# Patient Record
Sex: Female | Born: 1955
Health system: Southern US, Community
[De-identification: ages and names within clinical notes are randomized; demographics above are authoritative.]

## PROBLEM LIST (undated history)

## (undated) DIAGNOSIS — K52831 Collagenous colitis: Secondary | ICD-10-CM

## (undated) DIAGNOSIS — J189 Pneumonia, unspecified organism: Secondary | ICD-10-CM

## (undated) DIAGNOSIS — G629 Polyneuropathy, unspecified: Secondary | ICD-10-CM

## (undated) DIAGNOSIS — F319 Bipolar disorder, unspecified: Secondary | ICD-10-CM

## (undated) DIAGNOSIS — F1010 Alcohol abuse, uncomplicated: Secondary | ICD-10-CM

## (undated) DIAGNOSIS — M1990 Unspecified osteoarthritis, unspecified site: Secondary | ICD-10-CM

## (undated) DIAGNOSIS — F1910 Other psychoactive substance abuse, uncomplicated: Secondary | ICD-10-CM

## (undated) HISTORY — PX: WRIST SURGERY: SHX841

## (undated) HISTORY — PX: ABDOMINAL HYSTERECTOMY: SHX81

## (undated) HISTORY — PX: FRACTURE SURGERY: SHX138

## (undated) HISTORY — PX: TONSILLECTOMY: SUR1361

## (undated) HISTORY — PX: NOSE SURGERY: SHX723

## (undated) HISTORY — PX: FOOT SURGERY: SHX648

## (undated) HISTORY — DX: Polyneuropathy, unspecified: G62.9

---

## 2010-06-10 ENCOUNTER — Inpatient Hospital Stay (HOSPITAL_COMMUNITY): Admission: EM | Admit: 2010-06-10 | Discharge: 2010-06-14 | Payer: Self-pay | Admitting: Emergency Medicine

## 2010-06-12 ENCOUNTER — Encounter (INDEPENDENT_AMBULATORY_CARE_PROVIDER_SITE_OTHER): Payer: Self-pay | Admitting: Internal Medicine

## 2010-06-14 ENCOUNTER — Ambulatory Visit: Payer: Self-pay | Admitting: Internal Medicine

## 2010-08-12 ENCOUNTER — Ambulatory Visit: Payer: Self-pay | Admitting: Internal Medicine

## 2010-08-12 ENCOUNTER — Encounter (INDEPENDENT_AMBULATORY_CARE_PROVIDER_SITE_OTHER): Payer: Self-pay | Admitting: Family Medicine

## 2010-08-12 LAB — CONVERTED CEMR LAB
ALT: 9 units/L (ref 0–35)
Albumin: 4.2 g/dL (ref 3.5–5.2)
Alkaline Phosphatase: 94 units/L (ref 39–117)
BUN: 9 mg/dL (ref 6–23)
Basophils Absolute: 0 10*3/uL (ref 0.0–0.1)
Basophils Relative: 0 % (ref 0–1)
CO2: 27 meq/L (ref 19–32)
Calcium: 9.5 mg/dL (ref 8.4–10.5)
Ferritin: 143 ng/mL (ref 10–291)
Folate: >20 ng/mL
Glucose, Bld: 91 mg/dL (ref 70–99)
Iron: 109 ug/dL (ref 42–145)
MCV: 102.9 fL — ABNORMAL HIGH (ref 78.0–100.0)
Monocytes Absolute: 0.6 10*3/uL (ref 0.1–1.0)
Platelets: 278 10*3/uL (ref 150–400)
Potassium: 5.4 meq/L — ABNORMAL HIGH (ref 3.5–5.3)
RDW: 14.1 % (ref 11.5–15.5)
Rhuematoid fact SerPl-aCnc: <20 intl units/mL (ref 0–20)
Saturation Ratios: 30 % (ref 20–55)
Sed Rate: 36 mm/hr — ABNORMAL HIGH (ref 0–22)
Sodium: 142 meq/L (ref 135–145)
Total Bilirubin: 0.3 mg/dL (ref 0.3–1.2)
Total CHOL/HDL Ratio: 5.2
Triglycerides: 402 mg/dL — ABNORMAL HIGH (ref <150)
UIBC: 257 ug/dL
WBC: 8.2 10*3/uL (ref 4.0–10.5)

## 2010-08-28 ENCOUNTER — Ambulatory Visit (HOSPITAL_COMMUNITY): Admission: RE | Admit: 2010-08-28 | Discharge: 2010-08-28 | Payer: Self-pay | Admitting: Internal Medicine

## 2011-02-28 LAB — URINALYSIS, ROUTINE W REFLEX MICROSCOPIC
Ketones, ur: NEGATIVE mg/dL
Protein, ur: NEGATIVE mg/dL
pH: 7 (ref 5.0–8.0)

## 2011-02-28 LAB — UIFE/LIGHT CHAINS/TP QN, 24-HR UR
Alpha 1, Urine: DETECTED — AB
Alpha 2, Urine: DETECTED — AB
Free Kappa Lt Chains,Ur: 9.29 mg/dL — ABNORMAL HIGH (ref 0.04–1.51)
Gamma Globulin, Urine: DETECTED — AB

## 2011-02-28 LAB — CBC
HCT: 20.3 % — ABNORMAL LOW (ref 36.0–46.0)
HCT: 25.4 % — ABNORMAL LOW (ref 36.0–46.0)
HCT: 23 % — ABNORMAL LOW (ref 36.0–46.0)
Hemoglobin: 6.7 g/dL — CL (ref 12.0–15.0)
Hemoglobin: 8.7 g/dL — ABNORMAL LOW (ref 12.0–15.0)
Hemoglobin: 7.3 g/dL — ABNORMAL LOW (ref 12.0–15.0)
Hemoglobin: 7.6 g/dL — ABNORMAL LOW (ref 12.0–15.0)
MCH: 33.2 pg (ref 26.0–34.0)
MCH: 33.5 pg (ref 26.0–34.0)
MCH: 33 pg (ref 26.0–34.0)
MCHC: 33.8 g/dL (ref 30.0–36.0)
MCHC: 34.2 g/dL (ref 30.0–36.0)
MCV: 97.6 fL (ref 78.0–100.0)
MCV: 99.3 fL (ref 78.0–100.0)
MCV: 99.9 fL (ref 78.0–100.0)
Platelets: 218 10*3/uL (ref 150–400)
RBC: 2.73 MIL/uL — ABNORMAL LOW (ref 3.87–5.11)
RBC: 2.3 MIL/uL — ABNORMAL LOW (ref 3.87–5.11)
RDW: 19.3 % — ABNORMAL HIGH (ref 11.5–15.5)
RDW: 19.9 % — ABNORMAL HIGH (ref 11.5–15.5)
RDW: 19.9 % — ABNORMAL HIGH (ref 11.5–15.5)
WBC: 5.2 10*3/uL (ref 4.0–10.5)
WBC: 6.4 10*3/uL (ref 4.0–10.5)
WBC: 6.6 10*3/uL (ref 4.0–10.5)

## 2011-02-28 LAB — COMPREHENSIVE METABOLIC PANEL
ALT: 20 U/L (ref 0–35)
AST: 28 U/L (ref 0–37)
AST: 40 U/L — ABNORMAL HIGH (ref 0–37)
AST: 45 U/L — ABNORMAL HIGH (ref 0–37)
Alkaline Phosphatase: 147 U/L — ABNORMAL HIGH (ref 39–117)
BUN: 9 mg/dL (ref 6–23)
BUN: 9 mg/dL (ref 6–23)
CO2: 25 mEq/L (ref 19–32)
CO2: 27 mEq/L (ref 19–32)
CO2: 28 mEq/L (ref 19–32)
Calcium: 8.1 mg/dL — ABNORMAL LOW (ref 8.4–10.5)
Chloride: 108 mEq/L (ref 96–112)
Chloride: 103 mEq/L (ref 96–112)
Chloride: 99 mEq/L (ref 96–112)
Creatinine, Ser: 0.55 mg/dL (ref 0.4–1.2)
GFR calc non Af Amer: >60 mL/min (ref >60)
GFR calc non Af Amer: >60 mL/min (ref >60)
Glucose, Bld: 77 mg/dL (ref 70–99)
Glucose, Bld: 96 mg/dL (ref 70–99)
Potassium: 3.7 mEq/L (ref 3.5–5.1)
Total Bilirubin: 0.5 mg/dL (ref 0.3–1.2)
Total Bilirubin: 0.5 mg/dL (ref 0.3–1.2)
Total Protein: 5 g/dL — ABNORMAL LOW (ref 6.0–8.3)
Total Protein: 5.5 g/dL — ABNORMAL LOW (ref 6.0–8.3)

## 2011-02-28 LAB — DIFFERENTIAL
Eosinophils Absolute: 0.1 10*3/uL (ref 0.0–0.7)
Lymphocytes Relative: 43 % (ref 12–46)
Neutro Abs: 3.2 10*3/uL (ref 1.7–7.7)

## 2011-02-28 LAB — PROTEIN ELECTROPHORESIS, SERUM
Alpha-2-Globulin: 13.4 % — ABNORMAL HIGH (ref 7.1–11.8)
Beta Globulin: 7 % (ref 4.7–7.2)
Gamma Globulin: 16.6 % (ref 11.1–18.8)
M-Spike, %: NOT DETECTED g/dL

## 2011-02-28 LAB — FERRITIN: Ferritin: 211 ng/mL (ref 10–291)

## 2011-02-28 LAB — TYPE AND SCREEN

## 2011-02-28 LAB — PREPARE RBC (CROSSMATCH)

## 2011-02-28 LAB — URINE CULTURE: Colony Count: 2000

## 2011-02-28 LAB — HEPATITIS PANEL, ACUTE
Hep B C IgM: NEGATIVE
Hepatitis B Surface Ag: NEGATIVE

## 2011-02-28 LAB — POCT I-STAT, CHEM 8
BUN: 10 mg/dL (ref 6–23)
Chloride: 102 mEq/L (ref 96–112)
Creatinine, Ser: 0.6 mg/dL (ref 0.4–1.2)
Hemoglobin: 7.5 g/dL — ABNORMAL LOW (ref 12.0–15.0)
TCO2: 27 mmol/L (ref 0–100)

## 2011-02-28 LAB — URINE MICROSCOPIC-ADD ON

## 2011-02-28 LAB — LACTATE DEHYDROGENASE: LDH: 145 U/L (ref 94–250)

## 2011-02-28 LAB — T3, FREE: T3, Free: 2.1 pg/mL — ABNORMAL LOW (ref 2.3–4.2)

## 2011-02-28 LAB — IRON AND TIBC: TIBC: 267 ug/dL (ref 250–470)

## 2011-02-28 LAB — VITAMIN B12: Vitamin B-12: 428 pg/mL (ref 211–911)

## 2011-04-12 ENCOUNTER — Emergency Department (HOSPITAL_COMMUNITY)
Admission: EM | Admit: 2011-04-12 | Discharge: 2011-04-12 | Disposition: A | Discharge status: Home / Self Care | Payer: Self-pay | Attending: Emergency Medicine | Admitting: Emergency Medicine

## 2011-04-12 DIAGNOSIS — B029 Zoster without complications: Secondary | ICD-10-CM | POA: Insufficient documentation

## 2011-04-21 ENCOUNTER — Emergency Department (HOSPITAL_COMMUNITY)
Admission: EM | Admit: 2011-04-21 | Discharge: 2011-04-21 | Disposition: A | Discharge status: Home / Self Care | Payer: Self-pay | Attending: Emergency Medicine | Admitting: Emergency Medicine

## 2011-04-21 DIAGNOSIS — E871 Hypo-osmolality and hyponatremia: Secondary | ICD-10-CM | POA: Insufficient documentation

## 2011-04-21 DIAGNOSIS — R197 Diarrhea, unspecified: Secondary | ICD-10-CM | POA: Insufficient documentation

## 2011-04-21 DIAGNOSIS — K589 Irritable bowel syndrome without diarrhea: Secondary | ICD-10-CM | POA: Insufficient documentation

## 2011-04-21 LAB — POCT I-STAT, CHEM 8
BUN: 3 mg/dL — ABNORMAL LOW (ref 6–23)
Calcium, Ion: 1.04 mmol/L — ABNORMAL LOW (ref 1.12–1.32)
Chloride: 94 meq/L — ABNORMAL LOW (ref 96–112)
Creatinine, Ser: 0.8 mg/dL (ref 0.4–1.2)
Glucose, Bld: 81 mg/dL (ref 70–99)
HCT: 44 % (ref 36.0–46.0)
Hemoglobin: 15 g/dL (ref 12.0–15.0)
Potassium: 4.4 meq/L (ref 3.5–5.1)
Sodium: 128 mEq/L — ABNORMAL LOW (ref 135–145)
TCO2: 25 mmol/L (ref 0–100)

## 2011-08-28 ENCOUNTER — Emergency Department (HOSPITAL_COMMUNITY): Payer: Medicaid Other

## 2011-08-28 ENCOUNTER — Emergency Department (HOSPITAL_COMMUNITY)
Admission: EM | Admit: 2011-08-28 | Discharge: 2011-08-28 | Disposition: A | Discharge status: Home / Self Care | Payer: Medicaid Other | Attending: Emergency Medicine | Admitting: Emergency Medicine

## 2011-08-28 DIAGNOSIS — R05 Cough: Secondary | ICD-10-CM | POA: Insufficient documentation

## 2011-08-28 DIAGNOSIS — M7989 Other specified soft tissue disorders: Secondary | ICD-10-CM | POA: Insufficient documentation

## 2011-08-28 DIAGNOSIS — R0609 Other forms of dyspnea: Secondary | ICD-10-CM | POA: Insufficient documentation

## 2011-08-28 DIAGNOSIS — R059 Cough, unspecified: Secondary | ICD-10-CM | POA: Insufficient documentation

## 2011-08-28 DIAGNOSIS — X500XXA Overexertion from strenuous movement or load, initial encounter: Secondary | ICD-10-CM | POA: Insufficient documentation

## 2011-08-28 DIAGNOSIS — K029 Dental caries, unspecified: Secondary | ICD-10-CM | POA: Insufficient documentation

## 2011-08-28 DIAGNOSIS — J40 Bronchitis, not specified as acute or chronic: Secondary | ICD-10-CM | POA: Insufficient documentation

## 2011-08-28 DIAGNOSIS — R0989 Other specified symptoms and signs involving the circulatory and respiratory systems: Secondary | ICD-10-CM | POA: Insufficient documentation

## 2011-08-28 DIAGNOSIS — S82899A Other fracture of unspecified lower leg, initial encounter for closed fracture: Secondary | ICD-10-CM | POA: Insufficient documentation

## 2011-10-01 ENCOUNTER — Other Ambulatory Visit (HOSPITAL_COMMUNITY): Payer: Self-pay | Admitting: Family Medicine

## 2011-10-01 DIAGNOSIS — Z1231 Encounter for screening mammogram for malignant neoplasm of breast: Secondary | ICD-10-CM

## 2011-10-22 ENCOUNTER — Ambulatory Visit (HOSPITAL_COMMUNITY)
Admission: RE | Admit: 2011-10-22 | Discharge: 2011-10-22 | Disposition: A | Discharge status: Home / Self Care | Payer: Medicaid Other | Source: Ambulatory Visit | Attending: Family Medicine | Admitting: Family Medicine

## 2011-10-22 DIAGNOSIS — Z1231 Encounter for screening mammogram for malignant neoplasm of breast: Secondary | ICD-10-CM

## 2011-10-22 DIAGNOSIS — Z13820 Encounter for screening for osteoporosis: Secondary | ICD-10-CM | POA: Insufficient documentation

## 2011-10-22 DIAGNOSIS — Z780 Asymptomatic menopausal state: Secondary | ICD-10-CM | POA: Insufficient documentation

## 2011-11-09 ENCOUNTER — Ambulatory Visit (HOSPITAL_COMMUNITY)
Admission: RE | Admit: 2011-11-09 | Discharge: 2011-11-09 | Disposition: A | Discharge status: Home / Self Care | Payer: Medicaid Other | Source: Ambulatory Visit | Attending: Family Medicine | Admitting: Family Medicine

## 2011-11-09 ENCOUNTER — Other Ambulatory Visit (HOSPITAL_COMMUNITY): Payer: Self-pay | Admitting: Family Medicine

## 2011-11-09 DIAGNOSIS — R52 Pain, unspecified: Secondary | ICD-10-CM

## 2011-11-09 DIAGNOSIS — M25569 Pain in unspecified knee: Secondary | ICD-10-CM | POA: Insufficient documentation

## 2012-01-13 ENCOUNTER — Emergency Department (HOSPITAL_COMMUNITY)
Admission: EM | Admit: 2012-01-13 | Discharge: 2012-01-13 | Disposition: A | Discharge status: Home / Self Care | Payer: Medicaid Other | Source: Home / Self Care | Attending: Emergency Medicine | Admitting: Emergency Medicine

## 2012-01-13 ENCOUNTER — Encounter (HOSPITAL_COMMUNITY): Payer: Self-pay | Admitting: Emergency Medicine

## 2012-01-13 DIAGNOSIS — M25519 Pain in unspecified shoulder: Secondary | ICD-10-CM

## 2012-01-13 HISTORY — DX: Other psychoactive substance abuse, uncomplicated: F19.10

## 2012-01-13 HISTORY — DX: Alcohol abuse, uncomplicated: F10.10

## 2012-01-13 HISTORY — DX: Unspecified osteoarthritis, unspecified site: M19.90

## 2012-01-13 MED ORDER — INDOMETHACIN 25 MG PO CAPS: 25.0000 mg | ORAL_CAPSULE | Freq: Three times a day (TID) | ORAL | Status: AC

## 2012-01-13 NOTE — ED Notes (Signed)
C/o shoulder pain onset last Thursday.  Patient reported left neck and shoulder pain thinking she slept on neck wrong.  Has used heat and ice therapy, taken ibuprofen and lidocaine, voltaren cream and no relief

## 2012-01-13 NOTE — ED Provider Notes (Signed)
History     CSN: [id number]  Arrival date & time 01/13/12  1433   First MD Initiated Contact with Patient 01/13/12 1535      Chief Complaint  Patient presents with  . Shoulder Pain    (Consider location/radiation/quality/duration/timing/severity/associated sxs/prior treatment) HPI Comments: L shoulder pain,   Patient is a 56 y.o. female presenting with shoulder pain. The history is provided by the patient.  Shoulder Pain This is a new problem. The current episode started more than 2 days ago. The problem occurs constantly. The problem has not changed since onset.Pertinent negatives include no chest pain, no abdominal pain, no headaches and no shortness of breath. Exacerbated by: raising left shoudler and moving arm. The symptoms are relieved by nothing. She has tried nothing for the symptoms. The treatment provided no relief.    Past Medical History  Diagnosis Date  . Alcohol abuse last February 2012  . Drug abuse last 1990's  . Arthritis     Past Surgical History  Procedure Date  . Nose surgery   . Abdominal hysterectomy   . Tonsillectomy   . Wrist surgery     No family history on file.  History  Substance Use Topics  . Smoking status: Current Everyday Smoker    Types: Cigars  . Smokeless tobacco: Not on file  . Alcohol Use: No    OB History    Grav Para Term Preterm Abortions TAB SAB Ect Mult Living                  Review of Systems  Respiratory: Negative for shortness of breath.   Cardiovascular: Negative for chest pain.  Gastrointestinal: Negative for abdominal pain.  Neurological: Negative for headaches.    Allergies  Erythrocin and Sulfa antibiotics  Home Medications   Current Outpatient Rx  Name Route Sig Dispense Refill  . BUPROPION HCL ER (XL) 150 MG PO TB24 Oral Take 150 mg by mouth daily.    Marland Kitchen CITALOPRAM HYDROBROMIDE 10 MG PO TABS Oral Take 10 mg by mouth daily.    Marland Kitchen DICLOFENAC SODIUM 1 % TD GEL Topical Apply topically.    .  INDOMETHACIN 25 MG PO CAPS Oral Take 1 capsule (25 mg total) by mouth 3 (three) times daily with meals. 30 capsule 0    BP 130/74  Pulse 77  Temp(Src) 98.1 F (36.7 C) (Oral)  Resp 16  SpO2 95%  Physical Exam  Nursing note and vitals reviewed. Constitutional: No distress.  Neck: Neck supple.  Musculoskeletal:       Left shoulder: She exhibits tenderness and pain. She exhibits normal range of motion, no bony tenderness, no swelling, no effusion, no crepitus, no deformity, no spasm and normal strength.       Arms: Neurological: She is alert. No cranial nerve deficit. She exhibits normal muscle tone.  Skin: Skin is warm. She is not diaphoretic. No erythema.    ED Course  Procedures (including critical care time)  Labs Reviewed - No data to display No results found.   1. Shoulder arthralgia       MDM  L shoulder pain since Thursday, "felt like a muscle spasm", (tried motrin, Voltaren, tramadol, and lido derm some prescribed other on her own) Denies any recent trauma- exam was not consistent with a rotator cuff syndrome or tear       Jimmie Molly, MD 01/13/12 2210

## 2012-04-17 HISTORY — PX: OTHER SURGICAL HISTORY: SHX169

## 2012-04-17 HISTORY — PX: OSTEOTOMY: SHX137

## 2012-07-20 ENCOUNTER — Encounter (HOSPITAL_COMMUNITY): Payer: Self-pay | Admitting: Emergency Medicine

## 2012-07-20 ENCOUNTER — Emergency Department (HOSPITAL_COMMUNITY): Payer: Medicaid Other

## 2012-07-20 ENCOUNTER — Inpatient Hospital Stay (HOSPITAL_COMMUNITY)
Admission: EM | Admit: 2012-07-20 | Discharge: 2012-07-23 | DRG: 641 | Disposition: A | Discharge status: Home / Self Care | Payer: Medicaid Other | Attending: Internal Medicine | Admitting: Internal Medicine

## 2012-07-20 DIAGNOSIS — R519 Headache, unspecified: Secondary | ICD-10-CM | POA: Diagnosis present

## 2012-07-20 DIAGNOSIS — Z90710 Acquired absence of both cervix and uterus: Secondary | ICD-10-CM

## 2012-07-20 DIAGNOSIS — F319 Bipolar disorder, unspecified: Secondary | ICD-10-CM | POA: Diagnosis present

## 2012-07-20 DIAGNOSIS — F1011 Alcohol abuse, in remission: Secondary | ICD-10-CM | POA: Diagnosis present

## 2012-07-20 DIAGNOSIS — F411 Generalized anxiety disorder: Secondary | ICD-10-CM | POA: Diagnosis present

## 2012-07-20 DIAGNOSIS — R51 Headache: Secondary | ICD-10-CM | POA: Diagnosis present

## 2012-07-20 DIAGNOSIS — E872 Acidosis, unspecified: Principal | ICD-10-CM | POA: Diagnosis present

## 2012-07-20 DIAGNOSIS — K219 Gastro-esophageal reflux disease without esophagitis: Secondary | ICD-10-CM | POA: Diagnosis present

## 2012-07-20 DIAGNOSIS — M129 Arthropathy, unspecified: Secondary | ICD-10-CM | POA: Diagnosis present

## 2012-07-20 DIAGNOSIS — F1911 Other psychoactive substance abuse, in remission: Secondary | ICD-10-CM | POA: Diagnosis present

## 2012-07-20 DIAGNOSIS — Z720 Tobacco use: Secondary | ICD-10-CM | POA: Diagnosis present

## 2012-07-20 DIAGNOSIS — K5289 Other specified noninfective gastroenteritis and colitis: Secondary | ICD-10-CM | POA: Diagnosis present

## 2012-07-20 DIAGNOSIS — Z881 Allergy status to other antibiotic agents status: Secondary | ICD-10-CM

## 2012-07-20 DIAGNOSIS — Z882 Allergy status to sulfonamides status: Secondary | ICD-10-CM

## 2012-07-20 DIAGNOSIS — R079 Chest pain, unspecified: Secondary | ICD-10-CM | POA: Diagnosis present

## 2012-07-20 HISTORY — DX: Collagenous colitis: K52.831

## 2012-07-20 HISTORY — DX: Bipolar disorder, unspecified: F31.9

## 2012-07-20 LAB — CBC
HCT: 46.1 % — ABNORMAL HIGH (ref 36.0–46.0)
Hemoglobin: 16 g/dL — ABNORMAL HIGH (ref 12.0–15.0)
MCV: 92.6 fL (ref 78.0–100.0)
RDW: 13.9 % (ref 11.5–15.5)
WBC: 15.1 10*3/uL — ABNORMAL HIGH (ref 4.0–10.5)

## 2012-07-20 LAB — LACTIC ACID, PLASMA: Lactic Acid, Venous: 3.8 mmol/L — ABNORMAL HIGH (ref 0.5–2.2)

## 2012-07-20 LAB — BASIC METABOLIC PANEL
BUN: 12 mg/dL (ref 6–23)
Chloride: 90 mEq/L — ABNORMAL LOW (ref 96–112)
Creatinine, Ser: 0.62 mg/dL (ref 0.50–1.10)
Glucose, Bld: 49 mg/dL — ABNORMAL LOW (ref 70–99)
Potassium: 4.1 mEq/L (ref 3.5–5.1)

## 2012-07-20 LAB — URINALYSIS, ROUTINE W REFLEX MICROSCOPIC
Glucose, UA: NEGATIVE mg/dL
Leukocytes, UA: NEGATIVE
Nitrite: NEGATIVE
Specific Gravity, Urine: 1.025 (ref 1.005–1.030)
pH: 5.5 (ref 5.0–8.0)

## 2012-07-20 LAB — URINE MICROSCOPIC-ADD ON

## 2012-07-20 LAB — POCT I-STAT 3, ART BLOOD GAS (G3+)
O2 Saturation: 95 %
pCO2 arterial: 22.6 mmHg — ABNORMAL LOW (ref 35.0–45.0)
pH, Arterial: 7.191 — CL (ref 7.350–7.450)
pO2, Arterial: 94 mmHg (ref 80.0–100.0)

## 2012-07-20 LAB — GLUCOSE, CAPILLARY: Glucose-Capillary: 54 mg/dL — ABNORMAL LOW (ref 70–99)

## 2012-07-20 LAB — D-DIMER, QUANTITATIVE: D-Dimer, Quant: 2.67 ug/mL-FEU — ABNORMAL HIGH (ref 0.00–0.48)

## 2012-07-20 MED ORDER — MORPHINE SULFATE 4 MG/ML IJ SOLN
4.0000 mg | Freq: Once | INTRAMUSCULAR | Status: AC
  Administered 2012-07-20: 4 mg via INTRAVENOUS
  Filled 2012-07-20: qty 1

## 2012-07-20 MED ORDER — ONDANSETRON HCL 4 MG/2ML IJ SOLN
4.0000 mg | Freq: Once | INTRAMUSCULAR | Status: AC
  Administered 2012-07-20: 4 mg via INTRAVENOUS
  Filled 2012-07-20: qty 2

## 2012-07-20 MED ORDER — IOHEXOL 350 MG/ML SOLN
100.0000 mL | Freq: Once | INTRAVENOUS | Status: AC | PRN
  Administered 2012-07-20: 100 mL via INTRAVENOUS

## 2012-07-20 MED ORDER — SODIUM CHLORIDE 0.9 % IV BOLUS (SEPSIS)
500.0000 mL | Freq: Once | INTRAVENOUS | Status: AC
  Administered 2012-07-20: 500 mL via INTRAVENOUS

## 2012-07-20 MED ORDER — ASPIRIN 81 MG PO CHEW
CHEWABLE_TABLET | ORAL | Status: AC
  Administered 2012-07-20: 324 mg via ORAL
  Filled 2012-07-20: qty 4

## 2012-07-20 MED ORDER — ASPIRIN 81 MG PO CHEW
324.0000 mg | CHEWABLE_TABLET | Freq: Once | ORAL | Status: AC
  Administered 2012-07-20: 324 mg via ORAL

## 2012-07-20 MED ORDER — SODIUM CHLORIDE 0.9 % IV SOLN
INTRAVENOUS | Status: DC
  Administered 2012-07-21 (×2): via INTRAVENOUS

## 2012-07-20 MED ORDER — ASPIRIN 325 MG PO TABS: 325.0000 mg | ORAL_TABLET | ORAL | Status: DC

## 2012-07-20 MED ORDER — METOCLOPRAMIDE HCL 5 MG/ML IJ SOLN
10.0000 mg | Freq: Once | INTRAMUSCULAR | Status: AC
  Administered 2012-07-20: 10 mg via INTRAVENOUS
  Filled 2012-07-20: qty 2

## 2012-07-20 MED ORDER — DIPHENHYDRAMINE HCL 50 MG/ML IJ SOLN
25.0000 mg | Freq: Once | INTRAMUSCULAR | Status: AC
  Administered 2012-07-20: 25 mg via INTRAVENOUS
  Filled 2012-07-20: qty 1

## 2012-07-20 NOTE — ED Notes (Signed)
Pt c/o CP since Monday, states pain is in the left chest and does not radiate.  Also c/o left sided HA.  No SOB.  Also n/v today.  Pain increases with inspiration.

## 2012-07-20 NOTE — ED Notes (Signed)
CBG was 54. Notified Nurse Christa.

## 2012-07-20 NOTE — ED Notes (Signed)
I gave the patient a happy meal and a cranberry juice.

## 2012-07-20 NOTE — ED Notes (Signed)
Pt started vomiting at triage--but has stopped-she reports it comes and goes; emesis bag given

## 2012-07-20 NOTE — ED Provider Notes (Signed)
History     CSN: [id number]  Arrival date & time 07/20/12  1813   First MD Initiated Contact with Patient 07/20/12 1945      Chief Complaint  Patient presents with  . Chest Pain    (Consider location/radiation/quality/duration/timing/severity/associated sxs/prior treatment) HPI Comments: Chest pain for the past 3 days. Patient does not have any coronary history. Chest pain is in the left chest, described as stabbing, does not radiate. It is made worse with deep breathing. It is not made worse with pushing on the chest. She does have associated nausea and vomiting that started today. She denies shortness of breath. She has palpitations like her heart is racing. She denies cough or wheezing. Risk factors include smoking. Patient does not have a history of hypertension, hyperlipidemia, diabetes, family history of coronary artery disease. She's never had a stress test or catheterization. Patient took a flexeril without relief of pain. She denies other ingestions.   Patient describes left-sided, throbbing headache for 3 days. She denies injury or hitting her head. The onset of the pain was gradual. Course is constant. It is made worse by bright lights and loud sounds. Patient does not have a history of headaches. Nothing makes it better. She denies blurry vision, trouble talking, trouble walking, numbness/weakness/tingling in extremities.   Patient is a 56 y.o. female presenting with chest pain. The history is provided by the patient.  Chest Pain The chest pain began 3 - 5 days ago. Chest pain occurs constantly. Progression since onset: waxes and wanes. The pain is associated with breathing. The severity of the pain is moderate. The quality of the pain is described as stabbing. The pain does not radiate. Chest pain is worsened by certain positions and deep breathing. Primary symptoms include palpitations (racing heart), nausea and vomiting. Pertinent negatives for primary symptoms include no fever, no  fatigue, no shortness of breath, no cough, no wheezing and no abdominal pain.  The palpitations did not occur with shortness of breath. She tried nothing for the symptoms. Risk factors include smoking/tobacco exposure.  Pertinent negatives for past medical history include no diabetes, no hyperlipidemia, no hypertension and no PE.  Pertinent negatives for family medical history include: no CAD in family.     Past Medical History  Diagnosis Date  . Alcohol abuse last February 2012  . Drug abuse last 1990's  . Arthritis     Past Surgical History  Procedure Date  . Nose surgery   . Abdominal hysterectomy   . Tonsillectomy   . Wrist surgery   . Foot surgery     History reviewed. No pertinent family history.  History  Substance Use Topics  . Smoking status: Current Everyday Smoker -- 0.5 packs/day    Types: Cigars  . Smokeless tobacco: Not on file  . Alcohol Use: No    OB History    Grav Para Term Preterm Abortions TAB SAB Ect Mult Living                  Review of Systems  Constitutional: Negative for fever and fatigue.  HENT: Negative for sore throat, rhinorrhea, neck pain and neck stiffness.   Eyes: Negative for redness.  Respiratory: Negative for cough, shortness of breath and wheezing.   Cardiovascular: Positive for chest pain and palpitations (racing heart).  Gastrointestinal: Positive for nausea and vomiting. Negative for abdominal pain and diarrhea.  Genitourinary: Negative for dysuria.  Musculoskeletal: Negative for myalgias.  Skin: Negative for rash.  Neurological: Positive for headaches.  Allergies  Erythrocin and Sulfa antibiotics  Home Medications   Current Outpatient Rx  Name Route Sig Dispense Refill  . CYCLOBENZAPRINE HCL 5 MG PO TABS Oral Take 5 mg by mouth 3 (three) times daily as needed. For muscle spasms    . DICLOFENAC SODIUM 1 % TD GEL Topical Apply topically.    . TRAZODONE HCL 100 MG PO TABS Oral Take 100 mg by mouth at bedtime.       BP 136/93  Pulse 113  Temp 98.7 F (37.1 C) (Oral)  Resp 19  Ht 5\' 6"  (1.676 m)  Wt 142 lb (64.411 kg)  BMI 22.92 kg/m2  SpO2 95%  Physical Exam  Nursing note and vitals reviewed. Constitutional: She is oriented to person, place, and time. She appears well-developed and well-nourished.  HENT:  Head: Normocephalic and atraumatic.  Eyes: Conjunctivae are normal. Pupils are equal, round, and reactive to light. Right eye exhibits no discharge. Left eye exhibits no discharge.  Neck: Normal range of motion. Neck supple.       No meningeal signs  Cardiovascular: Regular rhythm and normal heart sounds.  Tachycardia present.   No murmur heard. Pulmonary/Chest: Effort normal and breath sounds normal. No respiratory distress. She has no wheezes.  Abdominal: Soft. Bowel sounds are normal. There is no tenderness. There is no rebound and no guarding.  Musculoskeletal: Normal range of motion. She exhibits no edema and no tenderness.  Neurological: She is alert and oriented to person, place, and time. She displays normal reflexes. No cranial nerve deficit or sensory deficit. She exhibits normal muscle tone. Coordination and gait normal. GCS eye subscore is 4. GCS verbal subscore is 5. GCS motor subscore is 6.  Skin: Skin is warm and dry.  Psychiatric: She has a normal mood and affect.    ED Course  Procedures (including critical care time)  Labs Reviewed  CBC - Abnormal; Notable for the following:    WBC 15.1 (*)     Hemoglobin 16.0 (*)     HCT 46.1 (*)     All other components within normal limits  BASIC METABOLIC PANEL - Abnormal; Notable for the following:    Sodium 133 (*)     Chloride 90 (*)     CO2 8 (*)     Glucose, Bld 49 (*)     All other components within normal limits  D-DIMER, QUANTITATIVE - Abnormal; Notable for the following:    D-Dimer, Quant 2.67 (*)     All other components within normal limits  URINALYSIS, ROUTINE W REFLEX MICROSCOPIC - Abnormal; Notable for the  following:    APPearance CLOUDY (*)     Hgb urine dipstick MODERATE (*)     Ketones, ur >80 (*)     Protein, ur 30 (*)     All other components within normal limits  LACTIC ACID, PLASMA - Abnormal; Notable for the following:    Lactic Acid, Venous 3.8 (*)     All other components within normal limits  POCT I-STAT 3, BLOOD GAS (G3+) - Abnormal; Notable for the following:    pH, Arterial 7.191 (*)     pCO2 arterial 22.6 (*)     Bicarbonate 8.6 (*)     Acid-base deficit 18.0 (*)     All other components within normal limits  GLUCOSE, CAPILLARY - Abnormal; Notable for the following:    Glucose-Capillary 54 (*)     All other components within normal limits  URINE MICROSCOPIC-ADD ON - Abnormal;  Notable for the following:    Squamous Epithelial / LPF FEW (*)     All other components within normal limits  POCT I-STAT TROPONIN I  GLUCOSE, CAPILLARY  SALICYLATE LEVEL  ACETAMINOPHEN LEVEL   Dg Chest 2 View  07/20/2012  *RADIOLOGY REPORT*  Clinical Data: Chest pain.  Myalgias.  Smoker.  CHEST - 2 VIEW  Comparison: 08/28/2011.  Findings: The heart remains normal in size.  The lungs remain mildly hyperexpanded with mild diffuse peribronchial thickening and accentuation of the interstitial markings.  An increased depth of inspiration is demonstrated with associated less prominence of interstitial markings. There is a questionable nodular density in the left upper lung zone, overlying the anterior aspect of the left second rib, not previously seen.  Diffuse osteopenia.  IMPRESSION:  1.  Possible interval left upper lobe nodule or left anterior second rib fracture with nonunion.  Further evaluation with a chest CT, with contrast, is recommended. 2.  Mild changes of COPD and chronic bronchitis.  Original Report Authenticated By: Darrol Angel, M.D.   Ct Head Wo Contrast  07/20/2012  *RADIOLOGY REPORT*  Clinical Data:  headaches  CT HEAD WITHOUT CONTRAST  Technique:  Contiguous axial images were obtained  from the base of the skull through the vertex without contrast  Comparison:  None.  Findings:  The brain has a normal appearance without evidence for hemorrhage, acute infarction, hydrocephalus, or mass lesion.  There is no extra axial fluid collection.  The skull and paranasal sinuses are normal.  IMPRESSION: Normal CT of the head without contrast.  Original Report Authenticated By: Judie Petit. Ruel Favors, M.D.   Ct Angio Chest Pe W/cm &/or Wo Cm  07/20/2012  *RADIOLOGY REPORT*  Clinical Data: Left chest pain, elevated D-dimer  CT ANGIOGRAPHY CHEST  Technique:  Multidetector CT imaging of the chest using the standard protocol during bolus administration of intravenous contrast. Multiplanar reconstructed images including MIPs were obtained and reviewed to evaluate the vascular anatomy.  Contrast: OMNIPAQUE IOHEXOL 350 MG/ML SOLN  Comparison: 07/20/2012, 08/28/2011  Findings: Atherosclerosis of the thoracic aorta.  Negative for dissection or significant aneurysm.  Mild diffuse ectasia.  Major branch vessels including the brachiocephalic, left common carotid, left subclavian arteries are all patent.  Central pulmonary arteries are patent.  Negative for mediastinal hemorrhage or adenopathy.  Normal heart size.  No pericardial or pleural effusion.  Small hiatal hernia noted.  Included upper abdomen demonstrates no acute finding.  Lung windows demonstrate biapical pleural parenchymal scarring. Dependent basilar atelectasis.  Negative for pneumonia, acute airspace process, collapse, consolidation, edema, interstitial change, or central airway abnormality.  Anterior left healing 2nd rib fracture noted accounting for the chest x-ray abnormality.  No suspicious pulmonary nodule or mass.  IMPRESSION: Negative for thoracic aortic dissection.  No significant central acute pulmonary embolus  Biapical pleural scarring and bibasilar atelectasis.  No acute intrathoracic process  Original Report Authenticated By: Judie Petit. Ruel Favors,  M.D.     1. Metabolic acidosis   2. Chest pain   3. Headache     8:07 PM Patient seen and examined. Work-up initiated. ASA given. Medications ordered. D-dimer added.   Vital signs reviewed and are as follows: Filed Vitals:   07/20/12 1824  BP: 136/93  Pulse: 113  Temp: 98.7 F (37.1 C)  Resp: 19   Pt was discussed with Dr. Manus Gunning who has seen. Elevated anion gap acidosis -- lactate elevated. CT angio chest, CT head pending. 1L fluid in, additional 0.5L ordered. Will give  additional pain medication as patient does not feel better after reglan/benadryl.   9:51 PM Unclear etiology at this point for patient's symptoms, however she appears well. Will continue to monitor. No abd pain. No resp distress. Normal mentation.   CT results reviewed by myself.   Teaching service admit as patient is unassigned.    MDM  Metabolic acidosis of uncertain etiology at this point. Lactate elevated. No focal source of infection. She appears well, but CP and HA not improved. Admit for further eval.        Renne Crigler, PA 07/21/12 [phone]

## 2012-07-20 NOTE — ED Notes (Signed)
Pt reports cp to L breast described as similar to stabbing; pt reports vomiting, SOB and pain with deep breaths; reports migraine and sensitivity to light

## 2012-07-20 NOTE — ED Notes (Signed)
Patient is unable to void at present. 

## 2012-07-21 ENCOUNTER — Encounter (HOSPITAL_COMMUNITY): Payer: Self-pay | Admitting: Internal Medicine

## 2012-07-21 DIAGNOSIS — K219 Gastro-esophageal reflux disease without esophagitis: Secondary | ICD-10-CM | POA: Diagnosis present

## 2012-07-21 DIAGNOSIS — R51 Headache: Secondary | ICD-10-CM | POA: Diagnosis present

## 2012-07-21 DIAGNOSIS — E872 Acidosis: Secondary | ICD-10-CM | POA: Diagnosis present

## 2012-07-21 DIAGNOSIS — Z720 Tobacco use: Secondary | ICD-10-CM | POA: Diagnosis present

## 2012-07-21 DIAGNOSIS — R519 Headache, unspecified: Secondary | ICD-10-CM | POA: Diagnosis present

## 2012-07-21 LAB — CARDIAC PANEL(CRET KIN+CKTOT+MB+TROPI)
CK, MB: 4.7 ng/mL — ABNORMAL HIGH (ref 0.3–4.0)
Relative Index: 3 — ABNORMAL HIGH (ref 0.0–2.5)
Relative Index: 2.9 — ABNORMAL HIGH (ref 0.0–2.5)
Total CK: 119 U/L (ref 7–177)
Total CK: 158 U/L (ref 7–177)
Troponin I: <0.3 ng/mL (ref <0.30)
Troponin I: <0.3 ng/mL (ref <0.30)

## 2012-07-21 LAB — RAPID URINE DRUG SCREEN, HOSP PERFORMED
Barbiturates: NOT DETECTED
Benzodiazepines: NOT DETECTED

## 2012-07-21 LAB — BASIC METABOLIC PANEL
Chloride: 98 mEq/L (ref 96–112)
Chloride: 93 mEq/L — ABNORMAL LOW (ref 96–112)
Creatinine, Ser: 0.43 mg/dL — ABNORMAL LOW (ref 0.50–1.10)
GFR calc Af Amer: >90 mL/min (ref >90)
GFR calc Af Amer: >90 mL/min (ref >90)
Potassium: 4.5 mEq/L (ref 3.5–5.1)

## 2012-07-21 LAB — LIPID PANEL
Cholesterol: 272 mg/dL — ABNORMAL HIGH (ref 0–200)
VLDL: UNDETERMINED mg/dL (ref 0–40)

## 2012-07-21 LAB — CBC
HCT: 39.6 % (ref 36.0–46.0)
Hemoglobin: 13.2 g/dL (ref 12.0–15.0)
MCH: 31 pg (ref 26.0–34.0)
MCHC: 33.3 g/dL (ref 30.0–36.0)
MCV: 93 fL (ref 78.0–100.0)

## 2012-07-21 LAB — HEPATIC FUNCTION PANEL
Bilirubin, Direct: 0.1 mg/dL (ref 0.0–0.3)
Indirect Bilirubin: 0.3 mg/dL (ref 0.3–0.9)
Total Protein: 7 g/dL (ref 6.0–8.3)

## 2012-07-21 LAB — KETONES, QUALITATIVE

## 2012-07-21 LAB — NA AND K (SODIUM & POTASSIUM), RAND UR
Potassium Urine: 28 mEq/L
Sodium, Ur: 98 mEq/L

## 2012-07-21 LAB — ACETAMINOPHEN LEVEL: Acetaminophen (Tylenol), Serum: <15 ug/mL (ref 10–30)

## 2012-07-21 LAB — PHOSPHORUS: Phosphorus: 2.2 mg/dL — ABNORMAL LOW (ref 2.3–4.6)

## 2012-07-21 LAB — OSMOLALITY: Osmolality: 289 mOsm/kg (ref 275–300)

## 2012-07-21 LAB — MAGNESIUM: Magnesium: 1.5 mg/dL (ref 1.5–2.5)

## 2012-07-21 LAB — SEDIMENTATION RATE: Sed Rate: 10 mm/hr (ref 0–22)

## 2012-07-21 LAB — HEMOGLOBIN A1C
Hgb A1c MFr Bld: 5.3 % (ref <5.7)
Mean Plasma Glucose: 105 mg/dL (ref <117)

## 2012-07-21 LAB — GLUCOSE, CAPILLARY
Glucose-Capillary: 198 mg/dL — ABNORMAL HIGH (ref 70–99)
Glucose-Capillary: 94 mg/dL (ref 70–99)

## 2012-07-21 LAB — ETHANOL: Alcohol, Ethyl (B): <11 mg/dL (ref 0–11)

## 2012-07-21 LAB — SALICYLATE LEVEL: Salicylate Lvl: <2 mg/dL — ABNORMAL LOW (ref 2.8–20.0)

## 2012-07-21 LAB — TSH: TSH: 1.592 u[IU]/mL (ref 0.350–4.500)

## 2012-07-21 MED ORDER — ONDANSETRON HCL 4 MG/2ML IJ SOLN
4.0000 mg | Freq: Once | INTRAMUSCULAR | Status: AC
  Administered 2012-07-21: 4 mg via INTRAVENOUS
  Filled 2012-07-21: qty 2

## 2012-07-21 MED ORDER — ALUM & MAG HYDROXIDE-SIMETH 200-200-20 MG/5ML PO SUSP
30.0000 mL | Freq: Four times a day (QID) | ORAL | Status: DC | PRN
  Administered 2012-07-21: 30 mL via ORAL
  Filled 2012-07-21: qty 30

## 2012-07-21 MED ORDER — MORPHINE SULFATE 2 MG/ML IJ SOLN
1.0000 mg | INTRAMUSCULAR | Status: DC | PRN
  Administered 2012-07-21 – 2012-07-23 (×9): 1 mg via INTRAVENOUS
  Filled 2012-07-21 (×9): qty 1

## 2012-07-21 MED ORDER — SODIUM CHLORIDE 0.9 % IV SOLN
INTRAVENOUS | Status: DC
  Administered 2012-07-21 – 2012-07-23 (×4): via INTRAVENOUS

## 2012-07-21 MED ORDER — MORPHINE SULFATE 4 MG/ML IJ SOLN
4.0000 mg | Freq: Once | INTRAMUSCULAR | Status: AC
  Administered 2012-07-21: 4 mg via INTRAVENOUS
  Filled 2012-07-21: qty 1

## 2012-07-21 MED ORDER — SODIUM CHLORIDE 0.9 % IJ SOLN
3.0000 mL | Freq: Two times a day (BID) | INTRAMUSCULAR | Status: DC
  Administered 2012-07-21 – 2012-07-23 (×4): 3 mL via INTRAVENOUS

## 2012-07-21 MED ORDER — PREDNISONE 20 MG PO TABS
60.0000 mg | ORAL_TABLET | Freq: Once | ORAL | Status: AC
  Administered 2012-07-21: 60 mg via ORAL
  Filled 2012-07-21: qty 3

## 2012-07-21 MED ORDER — ENOXAPARIN SODIUM 40 MG/0.4ML ~~LOC~~ SOLN
40.0000 mg | SUBCUTANEOUS | Status: DC
  Administered 2012-07-21 – 2012-07-23 (×3): 40 mg via SUBCUTANEOUS
  Filled 2012-07-21 (×3): qty 0.4

## 2012-07-21 MED ORDER — MAGNESIUM SULFATE 40 MG/ML IJ SOLN
2.0000 g | Freq: Once | INTRAMUSCULAR | Status: AC
  Administered 2012-07-21: 2 g via INTRAVENOUS
  Filled 2012-07-21: qty 50

## 2012-07-21 MED ORDER — TRAZODONE HCL 100 MG PO TABS
100.0000 mg | ORAL_TABLET | Freq: Every evening | ORAL | Status: DC | PRN
  Filled 2012-07-21: qty 1

## 2012-07-21 MED ORDER — SODIUM BICARBONATE 8.4 % IV SOLN
INTRAVENOUS | Status: DC
  Administered 2012-07-21 (×2): via INTRAVENOUS
  Filled 2012-07-21 (×4): qty 1000

## 2012-07-21 MED ORDER — BOOST / RESOURCE BREEZE PO LIQD
1.0000 | Freq: Three times a day (TID) | ORAL | Status: DC
  Administered 2012-07-21 – 2012-07-23 (×4): 1 via ORAL

## 2012-07-21 MED ORDER — ONDANSETRON HCL 4 MG/2ML IJ SOLN: 4.0000 mg | Freq: Four times a day (QID) | INTRAMUSCULAR | Status: DC | PRN

## 2012-07-21 MED ORDER — PANTOPRAZOLE SODIUM 40 MG PO TBEC
40.0000 mg | DELAYED_RELEASE_TABLET | Freq: Every day | ORAL | Status: DC
  Administered 2012-07-22 – 2012-07-23 (×2): 40 mg via ORAL
  Filled 2012-07-21 (×2): qty 1

## 2012-07-21 MED ORDER — NICOTINE 21 MG/24HR TD PT24
21.0000 mg | MEDICATED_PATCH | Freq: Every day | TRANSDERMAL | Status: DC
  Administered 2012-07-21 – 2012-07-23 (×3): 21 mg via TRANSDERMAL
  Filled 2012-07-21 (×3): qty 1

## 2012-07-21 MED ORDER — POLYVINYL ALCOHOL 1.4 % OP SOLN
1.0000 [drp] | OPHTHALMIC | Status: DC | PRN
  Filled 2012-07-21 (×2): qty 15

## 2012-07-21 MED ORDER — ONDANSETRON HCL 4 MG PO TABS
4.0000 mg | ORAL_TABLET | Freq: Four times a day (QID) | ORAL | Status: DC | PRN
  Administered 2012-07-21: 4 mg via ORAL
  Filled 2012-07-21: qty 1

## 2012-07-21 NOTE — Progress Notes (Signed)
INITIAL ADULT NUTRITION ASSESSMENT Date: 07/21/2012   Time: 11:47 AM  INTERVENTION:  [employer] supplement 3 times daily (250 kcals, 9 gm protein per 8 fl oz carton) RD to follow for nutrition care plan  Reason for Assessment: Nutrition Risk Report  ASSESSMENT: Female 56 y.o.  Dx: Metabolic acidosis, increased anion gap (IAG)  Hx:  Past Medical History  Diagnosis Date  . Alcohol abuse last February 2012  . Drug abuse last 1990's  . Arthritis   . Collagenous colitis   . Bipolar disorder     Related Meds:     . aspirin  324 mg Oral Once  . diphenhydrAMINE  25 mg Intravenous Once  . enoxaparin (LOVENOX) injection  40 mg Subcutaneous Q24H  . magnesium sulfate 1 - 4 g bolus IVPB  2 g Intravenous Once  . metoCLOPramide (REGLAN) injection  10 mg Intravenous Once  .  morphine injection  4 mg Intravenous Once  .  morphine injection  4 mg Intravenous Once  . ondansetron  4 mg Intravenous Once  . ondansetron  4 mg Intravenous Once  . predniSONE  60 mg Oral Once  . sodium chloride  500 mL Intravenous Once  . sodium chloride  3 mL Intravenous Q12H  . DISCONTD: aspirin  325 mg Oral STAT    Ht: 5\' 6"  (167.6 cm)  Wt: 145 lb (65.772 kg)  Ideal Wt: 60 kg % Ideal Wt: 109%  Usual Wt: 170 lb % Usual Wt: 85%  Body mass index is 23.40 kg/(m^2).  Food/Nutrition Related Hx: unintentional weight loss > 10 lbs within the last month per admission nutrition screen  Labs:  CMP     Component Value Date/Time   NA 133* 07/21/2012 0613   K 4.5 07/21/2012 0613   CL 98 07/21/2012 0613   CO2 15* 07/21/2012 0613   GLUCOSE 123* 07/21/2012 0613   BUN 9 07/21/2012 0613   CREATININE 0.48* 07/21/2012 0613   CALCIUM 8.4 07/21/2012 0613   PROT 7.0 07/21/2012 0114   ALBUMIN 3.4* 07/21/2012 0114   AST 30 07/21/2012 0114   ALT 16 07/21/2012 0114   ALKPHOS 84 07/21/2012 0114   BILITOT 0.4 07/21/2012 0114   GFRNONAA >90 07/21/2012 0613   GFRAA >90 07/21/2012 1610     Intake/Output Summary (Last 24 hours) at  07/21/12 1148 Last data filed at 07/21/12 1100  Gross per 24 hour  Intake    400 ml  Output    900 ml  Net   -500 ml    CBG (last 3)   Basename 07/21/12 0428 07/21/12 0033 07/20/12 2207  GLUCAP 96 94 54*    Diet Order: General  Supplements/Tube Feeding: N/A  IVF:    dextrose 5 % 1,000 mL with sodium bicarbonate 150 mEq infusion Last Rate: 100 mL/hr at 07/21/12 9604  DISCONTD: sodium chloride Last Rate: 125 mL/hr at 07/21/12 0554    Estimated Nutritional Needs:   Kcal: 1650-1850 Protein: 70-80 gm Fluid: 1.6-1.8 L  Patient presents with complaints of headache, chest pain, N/V prior to admission; does report a poor ppetite; reports she was only eating 1 meal per day -- RD interprets this intake to be < 75% of estimated energy needs; states she's lost approximately 25 lb in the past 3 months (15%); she's noticed her clothes are fitting much looser; patient with visible muscle to temples & upper body; she is amenable to [employer] supplement -- RD to order.  Patient meets criteria for severe malnutrition in the  context of chronic illness given < 75% intake of estimated energy requirement for > 1 month, 15% weight loss x 3 months and severe muscle loss.  NUTRITION DIAGNOSIS: -Inadequate oral intake (NI-2.1).  Status: Ongoing  RELATED TO: limited appetite  AS EVIDENCE BY: PO intake 40%  MONITORING/EVALUATION(Goals): Goal: Oral intake with meals & supplements to meet >/= 90% of estimated nutrition needs Monitor: PO & supplemental intake, weight, labs, I/O's  EDUCATION NEEDS: -No education needs identified at this time  Dietitian #: [phone]  DOCUMENTATION CODES Per approved criteria  -Severe malnutrition in the context of chronic illness    Monique Perry 07/21/2012, 11:47 AM

## 2012-07-21 NOTE — Progress Notes (Addendum)
Patient had blood glucose of 418 at 1717; 198 at 2353, called to get advice from attending Physician, Advised to continue to monitor and stop D5 fluids

## 2012-07-21 NOTE — ED Notes (Signed)
Admitting MD at bedside.

## 2012-07-21 NOTE — Progress Notes (Signed)
Medical Student Daily Progress Note  Subjective: Monique Perry says she is feeling much better this afternoon compared to last night. Her HA is gone, she believes her vision has improved, although she cannot be certain since she left her "good glasses" at home. Her chest pain is only mild at this point; she was having some palpitations on admission but now is feeling a burning sensation. She believes this is because she has just eaten lunch. She notes that she had not eaten since this past Monday. She says she usually goes several days without eating partially because she sleeps during the day and is a "night owl." She has been particularly busy this week since she is moving. She also reports that she used to take a multivitamin until within the last month, saying she wanted to cut down on her medications.  Objective: Vital signs in last 24 hours: Filed Vitals:   07/21/12 0229 07/21/12 0345 07/21/12 0400 07/21/12 1400  BP: 133/70 140/76 144/79 127/76  Pulse:  85 76 79  Temp:   98.6 F (37 C) 98.2 F (36.8 C)  TempSrc:   Oral   Resp: 24 18 19 18   Height:   5\' 6"  (1.676 m)   Weight:   65.772 kg (145 lb)   SpO2: 95% 92% 93% 97%   Weight change:   Intake/Output Summary (Last 24 hours) at 07/21/12 1513 Last data filed at 07/21/12 1200  Gross per 24 hour  Intake    800 ml  Output    900 ml  Net   -100 ml   Physical Exam: Vitals reviewed. General: resting in bed, NAD HEENT: PERRL, left eye lacrimation>right, EOMI, no scleral icterus Cardiac: RRR, no rubs, murmurs or gallops Pulm: clear to auscultation bilaterally, no wheezes, rales, or rhonchi Abd: soft, nontender, nondistended, BS present Ext: warm and well perfused, no pedal edema Neuro: alert and oriented X3, cranial nerves II-XII grossly intact, strength and sensation to light touch equal in bilateral upper and lower extremities  Lab Results: Basic Metabolic Panel:  Lab 07/21/12 4540 07/21/12 0153 07/21/12 0114 07/20/12 1832  NA  133* -- -- 133*  K 4.5 -- -- 4.1  CL 98 -- -- 90*  CO2 15* -- -- 8*  GLUCOSE 123* -- -- 49*  BUN 9 -- -- 12  CREATININE 0.48* -- -- 0.62  CALCIUM 8.4 -- -- 9.2  MG -- -- 1.5 --  PHOS -- 2.2* -- --   Liver Function Tests:  Lab 07/21/12 0114  AST 30  ALT 16  ALKPHOS 84  BILITOT 0.4  PROT 7.0  ALBUMIN 3.4*    Lab 07/21/12 0613  LIPASE 19  AMYLASE --   CBC:  Lab 07/21/12 0613 07/20/12 1832  WBC 12.2* 15.1*  NEUTROABS -- --  HGB 13.2 16.0*  HCT 39.6 46.1*  MCV 93.0 92.6  PLT 217 207   Cardiac Enzymes:  Lab 07/21/12 1047 07/21/12 0616 07/21/12 0114  CKTOTAL 177 158 119  CKMB 5.1* 4.7* 4.2*  CKMBINDEX -- -- --  TROPONINI <0.30 <0.30 <0.30   D-Dimer:  Lab 07/20/12 2017  DDIMER 2.67*   CBG:  Lab 07/21/12 0428 07/21/12 0033 07/20/12 2207  GLUCAP 96 94 54*   Hemoglobin A1C:  Lab 07/21/12 0114  HGBA1C 5.3   Fasting Lipid Panel:  Lab 07/21/12 0114  CHOL 272*  HDL 35*  LDLCALC UNABLE TO CALCULATE IF TRIGLYCERIDE OVER 400 mg/dL  TRIG 981*  CHOLHDL 7.8  LDLDIRECT --   Thyroid Function Tests:  Lab 07/21/12 0114  TSH 1.592  T4TOTAL --  FREET4 --  T3FREE --  THYROIDAB --   Urine Drug Screen: Drugs of Abuse     Component Value Date/Time   LABOPIA POSITIVE* 07/20/2012 2302   COCAINSCRNUR NONE DETECTED 07/20/2012 2302   LABBENZ NONE DETECTED 07/20/2012 2302   AMPHETMU NONE DETECTED 07/20/2012 2302   THCU NONE DETECTED 07/20/2012 2302   LABBARB NONE DETECTED 07/20/2012 2302   * note: was taken after being given morphine in the ED Alcohol Level:  Lab 07/21/12 0613  ETH <11   Urinalysis:  Lab 07/20/12 2302  COLORURINE YELLOW  LABSPEC 1.025  PHURINE 5.5  GLUCOSEU NEGATIVE  HGBUR MODERATE*  BILIRUBINUR NEGATIVE  KETONESUR >80*  PROTEINUR 30*  UROBILINOGEN 0.2  NITRITE NEGATIVE  LEUKOCYTESUR NEGATIVE   Studies/Results: Dg Chest 2 View  07/20/2012  *RADIOLOGY REPORT*  Clinical Data: Chest pain.  Myalgias.  Smoker.  CHEST - 2 VIEW  Comparison:  08/28/2011.  Findings: The heart remains normal in size.  The lungs remain mildly hyperexpanded with mild diffuse peribronchial thickening and accentuation of the interstitial markings.  An increased depth of inspiration is demonstrated with associated less prominence of interstitial markings. There is a questionable nodular density in the left upper lung zone, overlying the anterior aspect of the left second rib, not previously seen.  Diffuse osteopenia.  IMPRESSION:  1.  Possible interval left upper lobe nodule or left anterior second rib fracture with nonunion.  Further evaluation with a chest CT, with contrast, is recommended. 2.  Mild changes of COPD and chronic bronchitis.  Original Report Authenticated By: Darrol Angel, M.D.   Ct Head Wo Contrast  07/20/2012  *RADIOLOGY REPORT*  Clinical Data:  headaches  CT HEAD WITHOUT CONTRAST  Technique:  Contiguous axial images were obtained from the base of the skull through the vertex without contrast  Comparison:  None.  Findings:  The brain has a normal appearance without evidence for hemorrhage, acute infarction, hydrocephalus, or mass lesion.  There is no extra axial fluid collection.  The skull and paranasal sinuses are normal.  IMPRESSION: Normal CT of the head without contrast.  Original Report Authenticated By: Judie Petit. Ruel Favors, M.D.   Ct Angio Chest Pe W/cm &/or Wo Cm  07/20/2012  *RADIOLOGY REPORT*  Clinical Data: Left chest pain, elevated D-dimer  CT ANGIOGRAPHY CHEST  Technique:  Multidetector CT imaging of the chest using the standard protocol during bolus administration of intravenous contrast. Multiplanar reconstructed images including MIPs were obtained and reviewed to evaluate the vascular anatomy.  Contrast: OMNIPAQUE IOHEXOL 350 MG/ML SOLN  Comparison: 07/20/2012, 08/28/2011  Findings: Atherosclerosis of the thoracic aorta.  Negative for dissection or significant aneurysm.  Mild diffuse ectasia.  Major branch vessels including the  brachiocephalic, left common carotid, left subclavian arteries are all patent.  Central pulmonary arteries are patent.  Negative for mediastinal hemorrhage or adenopathy.  Normal heart size.  No pericardial or pleural effusion.  Small hiatal hernia noted.  Included upper abdomen demonstrates no acute finding.  Lung windows demonstrate biapical pleural parenchymal scarring. Dependent basilar atelectasis.  Negative for pneumonia, acute airspace process, collapse, consolidation, edema, interstitial change, or central airway abnormality.  Anterior left healing 2nd rib fracture noted accounting for the chest x-ray abnormality.  No suspicious pulmonary nodule or mass.  IMPRESSION: Negative for thoracic aortic dissection.  No significant central acute pulmonary embolus  Biapical pleural scarring and bibasilar atelectasis.  No acute intrathoracic process  Original Report Authenticated By:  M. Ruel Favors, M.D.   Medications: I have reviewed the patient's current medications. Scheduled Meds:   . aspirin  324 mg Oral Once  . diphenhydrAMINE  25 mg Intravenous Once  . enoxaparin (LOVENOX) injection  40 mg Subcutaneous Q24H  . feeding supplement  1 Container Oral TID BM  . magnesium sulfate 1 - 4 g bolus IVPB  2 g Intravenous Once  . metoCLOPramide (REGLAN) injection  10 mg Intravenous Once  .  morphine injection  4 mg Intravenous Once  .  morphine injection  4 mg Intravenous Once  . ondansetron  4 mg Intravenous Once  . ondansetron  4 mg Intravenous Once  . predniSONE  60 mg Oral Once  . sodium chloride  500 mL Intravenous Once  . sodium chloride  3 mL Intravenous Q12H  . DISCONTD: aspirin  325 mg Oral STAT   Continuous Infusions:   . dextrose 5 % 1,000 mL with sodium bicarbonate 150 mEq infusion 100 mL/hr at 07/21/12 0627  . DISCONTD: sodium chloride 125 mL/hr at 07/21/12 0554   PRN Meds:.alum & mag hydroxide-simeth, iohexol, morphine injection, ondansetron (ZOFRAN) IV, ondansetron,  traZODone Assessment/Plan: Monique Perry is a 56 yo female with with PMH bipolar d/o, collagenous colitis, alcoholism, who presented to the ED with complaints of headache, chest pain, N/V with anion-gap metabolic acidosis. Symptoms have improved with correction of gap acidosis.  Principal Problem:  1. *Metabolic acidosis, increased anion gap: Her anion gap on admission was 35, and has since improved to 20 with a delta(AG)/delta(HCO3-) of 1.56 with appropriate respiratory compensation. In turn, her symptoms have improved markedly as well. She stopped using alcohol after several life-threatening episodes last year, and says she is "addicted" to support groups. She appears reliable in this capacity. However, she should be monitored for symptoms of alcohol withdrawal given her history and clinical presentation of ketoacidosis. Her history of poor nutrition is most likely the underlying cause for her acidosis. She has lost 25 lbs in the past 3 months and has markedly decreased nutritional intake.  - Appreciate nutrition recommendations. Start resource breeze 1 container 3 times daily between meals - Continue IVF NS @ 100 ccs/hr for now - PM BMet  Active Problems:  2. Headache: Patient denies any history of trauma. Due to concern of temporal arteritis, she received 60 mg prednisone in the ED. With an ESR of 10, temporal arteritis is unlikely. Negative CT clears concern for CVA. She describes her headache as being "almost completely gone." -morphine prn   3. Chest pain: Etiology most likely GI related. Patient has a significant history of GERD and has not been taking her home omeprazole as directed. Negative EKG, CTA, CE's, TSH. - d/c telemetry. Pt has no cardiac history - start protonix 40 mg PO QDay - continue vital checks - Normal diet, advance as tolerated - d/c ASA chewable 325 mg - morphine PRN - maalox PRN - zofran PRN   4. Tobacco abuse: Patient says she is ready to quit. She has used the patch  before but this was not helpful. -Discussed using ecigarettes with the patient, which she seemed to intrigue her - order smoking cessation counseling  5. Nausea/Vomiting:  Appears to have cleared with the patient having tolerated lunch. -Continue zofran 4 mg Q4H PRN  6. Collagenous colitis: per patient, this condition is stable. She has been prescribed 3 medications for this issue which she is not currently taking and is unsure of the names/doses/frequencies of these meds. She received 1  dose of metoclopramide in the ED for gut stimulation. - Record request has been sent to [facility] (Dr. Elnoria Howard).  7. History of Bipolar disorder: Patient's mood has been at her baseline during this hospitalization. -Continue trazodone 100 mg PO QHS  8. Dispo:  The patient's symptoms have markedly improved since admission, she can most likely be discharged tomorrow morning. [facility]. - appt with Dr. Everardo Beals on 07/26/12 at 11:15 AM to establish care and for hospital f/u   LOS: 1 day   This is a Psychologist, occupational Note.  The care of the patient was discussed with Dr. Tonny Branch and the assessment and plan formulated with their assistance.  Please see their attached note for official documentation of the daily encounter.  Luretha Rued 07/21/2012, 3:13 PM

## 2012-07-21 NOTE — ED Provider Notes (Signed)
Medical screening examination/treatment/procedure(s) were conducted as a shared visit with non-physician practitioner(s) and myself.  I personally evaluated the patient during the encounter  Gradual onset headache with nausea, vomiting, photophobia. Also L sided reproducible chest pain. No temporal artery tenderness, no meningismus. No motor deficits, but states more sensitive on left side. Profound metabolic acidosis with appropriate respiratory compensation. Lactate 3.8 but only partially explains. Consider infection, poor PO intake, diarrhea, ethanol, salicylate.   CRITICAL CARE Performed by: Glynn Octave   Total critical care time: 30  Critical care time was exclusive of separately billable procedures and treating other patients.  Critical care was necessary to treat or prevent imminent or life-threatening deterioration.  Critical care was time spent personally by me on the following activities: development of treatment plan with patient and/or surrogate as well as nursing, discussions with consultants, evaluation of patient's response to treatment, examination of patient, obtaining history from patient or surrogate, ordering and performing treatments and interventions, ordering and review of laboratory studies, ordering and review of radiographic studies, pulse oximetry and re-evaluation of patient's condition.   Glynn Octave, MD 07/21/12 1806

## 2012-07-21 NOTE — H&P (Signed)
IM Attending  74 woman with hx of "collaginous colitis" followed by Dr. Elnoria Howard but currently on no meds.  Also has bi-polar disorder followed at [facility] -- only on trazodone.  Admitted for 1 week of focal neurologic sx incl blurring O.S. (or left hemonymous field?), left arm weakness.  These complaints and physical findings seem to have resolved overnight.  Head CT was negative. Also had a rather dramatic acid-base disturbance on admission.  Bicarb < 8, anion gap = 35, lactate up slightly at 3.8, salicylate negative, creatinine = 0.6, glucose = 49.  Urine ketones are large; serum ketones pending.  ABG: 7.19 -- 22--94 -- 8.  Serum osmolality to be measured.  Denies any unusual ingestion and seems reliable. Overnight with only IVF her HCO3 ia up to 15 and anion gan is down to 20.  This is a mystery to me.  There is an entity of d-lactic acidosis. Will pursue this.

## 2012-07-21 NOTE — Progress Notes (Signed)
Utilization review completed.  

## 2012-07-21 NOTE — Progress Notes (Signed)
Patient complaining of dry eyes, paged Dr at [employer] for advisement

## 2012-07-21 NOTE — Progress Notes (Signed)
Resident Co-sign Daily Note: I have seen the patient and reviewed the daily progress note by Monique Ruddy MS IV and discussed the care of the patient with them.  See below for documentation of my findings, assessment, and plans.  Subjective: Feeling "110% better" then yesterday.  She states that her abdominal pain has resolved and she no longer has the numbness and weakness on her left face and arm.  Her vision is almost completely better too.  States that she has been urinating well but has not had a BM since admission.    Objective: Vital signs in last 24 hours: Filed Vitals:   07/21/12 0400 07/21/12 1400 07/21/12 1900 07/21/12 2116  BP: 144/79 127/76 123/56 132/68  Pulse: 76 79 80 71  Temp: 98.6 F (37 C) 98.2 F (36.8 C) 98.3 F (36.8 C) 97.9 F (36.6 C)  TempSrc: Oral  Oral Oral  Resp: 19 18 18 18   Height: 5\' 6"  (1.676 m)     Weight: 145 lb (65.772 kg)     SpO2: 93% 97% 98% 97%   Physical Exam: Vitals reviewed. General: resting in bed, NAD, appears older then stated age.  HEENT: PERRL, EOMI, no scleral icterus, eyes sunken appearing.  Cardiac: RRR, no rubs, murmurs or gallops Pulm: clear to auscultation bilaterally, no wheezes, rales, or rhonchi Abd: soft, nontender, nondistended, BS present Ext: muscle wasting noted on the upper and lower extremities. warm and well perfused, no pedal edema Neuro: alert and oriented X3, cranial nerves II-XII grossly intact, strength and sensation to light touch equal in bilateral upper and lower extremities  Lab Results: Reviewed and documented in Electronic Record Micro Results: Reviewed and documented in Electronic Record Studies/Results: Reviewed and documented in Electronic Record Medications: I have reviewed the patient's current medications. Scheduled Meds:   . enoxaparin (LOVENOX) injection  40 mg Subcutaneous Q24H  . feeding supplement  1 Container Oral TID BM  . magnesium sulfate 1 - 4 g bolus IVPB  2 g Intravenous Once  .   morphine injection  4 mg Intravenous Once  .  morphine injection  4 mg Intravenous Once  . nicotine  21 mg Transdermal Daily  . ondansetron  4 mg Intravenous Once  . ondansetron  4 mg Intravenous Once  . pantoprazole  40 mg Oral Q1200  . predniSONE  60 mg Oral Once  . sodium chloride  500 mL Intravenous Once  . sodium chloride  3 mL Intravenous Q12H   Continuous Infusions:   . dextrose 5 % 1,000 mL with sodium bicarbonate 150 mEq infusion 100 mL/hr at 07/21/12 1717  . DISCONTD: sodium chloride 125 mL/hr at 07/21/12 0554   PRN Meds:.alum & mag hydroxide-simeth, iohexol, morphine injection, ondansetron (ZOFRAN) IV, ondansetron, polyvinyl alcohol, DISCONTD: traZODone Assessment/Plan: 1. *Metabolic acidosis, increased anion gap: Monique Perry initially presented with headache, chest pain, nausea, and vomiting for several days.  Her anion gap on admission was 35, and has since improved to 20 with a delta(AG)/delta(HCO3-) of 1.56 with appropriate respiratory compensation. In turn, her symptoms have improved markedly as well. Initial differential diagnosis was broad and included toxic ingestions, alcoholic ketoacidosis, starvation ketoacidosis, or D-lactic acidosis.  She states that she has been extremely busy for the last week moving that she has "kinda forgotten to eat."  She states that she usually only eats 1 meal per day and that she "may have forgotten" to eat for the last few days.  She states that the last time she remembers for sure eating  was Monday around noon.  She stopped using alcohol after several life-threatening episodes last year, and says she is "addicted" to support groups. She states that she has been sober since February 29th 2012 and goes to 2 AA meetings per day.  She appears reliable in this capacity. Today her serum ketones returned markedly positive which makes D-lactic acidosis less likely.    - monitor for symptoms of alcohol withdrawal given her history and clinical presentation  of ketoacidosis. Her history of poor nutrition is most likely the underlying cause for her acidosis. She has lost 25 lbs in the past 3 months and has markedly decreased nutritional intake.   - Appreciate nutrition recommendations. Start resource breeze 1 container 3 times daily between meals   - Continue IVF D5 with 3 amps of bicarb @ 100 ccs/hr for now   - PM BMet, if Gap is closed will swap to NS without bicarb.   2. Headache: On presentation she had some interesting neurologic manifestations that did not follow any vascular pattern to them.  She had denied any history of trauma. Due to concern of temporal arteritis, she received 60 mg prednisone in the ED. With an ESR of 10, temporal arteritis is unlikely. Negative CT clears concern for CVA. She describes her headache as being "almost completely gone."  -morphine prn   3. Chest pain: On admission she was complaining of epigastric pain that radiated up to her left chest.  Pain was worse with vomiting Etiology most likely GI related. Patient has a significant history of GERD and has not been taking her home omeprazole as directed. Negative EKG, CTA, CE's, TSH.   - d/c telemetry. Pt has no cardiac history   - start protonix 40 mg PO QDay   - continue vital checks   - Normal diet, advance as tolerated   - d/c ASA chewable 325 mg   - morphine PRN   - maalox PRN   - zofran PRN   4. Tobacco abuse: Patient says she is ready to quit. She has used the patch before but this was not helpful.   -Discussed using ecigarettes with the patient, which she seemed to intrigue her   - order smoking cessation counseling   5. Nausea/Vomiting: Appears to have cleared with the patient having tolerated lunch.   -Continue zofran 4 mg Q4H PRN   6. Collagenous colitis: per patient, this condition is stable. She has been prescribed 3 medications for this issue which she is not currently taking and is unsure of the names/doses/frequencies of these meds. She received 1  dose of metoclopramide in the ED for gut stimulation.   - Record request has been sent to [facility] (Dr. Elnoria Howard).   7. History of Bipolar disorder: Patient's mood has been at her baseline during this hospitalization.   -Continue trazodone 100 mg PO QHS   8. Dispo: The patient's symptoms have markedly improved since admission, she can most likely be discharged tomorrow morning. [facility].   - appt with Dr. Everardo Beals on 07/26/12 at 11:15 AM to establish care and for hospital f/u   LOS: 1 day   Monique Perry 07/21/2012, 9:21 PM

## 2012-07-21 NOTE — Progress Notes (Signed)
Pt telemetry d/c'd per MD order. Pt transferred to 6N21. Report given to [facility], Charity fundraiser.  Efraim Kaufmann

## 2012-07-21 NOTE — H&P (Signed)
Hospital Admission Note Date: 07/21/2012  Patient name: Monique Perry Medical record number: [id number] Date of birth: 1956/02/09 Age: 56 y.o. Gender: female PCP: DEFAULT,PROVIDER, MD  Medical Service: IMTS  Attending physician:  Dr. Ulyess Mort    1st Contact: Desma Maxim, MD  Pager: [phone] 2nd Contact: Bard Herbert, MD  Pager: [phone] After 5 pm or weekends: 1st Contact:      Pager: [phone] 2nd Contact:      Pager: [phone]  Chief Complaint: Headache and Chest pain  History of Present Illness: Patient is a 56 y.o. woman with PMH bipolar d/o, IBD (collagenous colitis), drug abuse, who presents with complaints of headache, chest pain, N/V since 07/15/2012. The first symptom she noticed was headache, which started 6 days prior to admission, is left-sided, above the left eye, throbbing and pressure-like in character, with pain that is constant. She has had blurry vision in the left eye, left eye weakness , dizziness, and generalized weakness associated with this HA since it began. She also admits to photophobia and phonophobia for the last 4-5 days. The photophobia is limited to the left eye. She has had associated blurry vision in this eye, but no identifiable vision loss.  The patient also complains of chest pain which began one day after the headache, is stabbing/pressure/aching in character, 8/10 in severity, and is substernal in location. The pain has been constant during this time, and is mildly improved at rest (patient denies worsening with exertion). She did not take any medication to help alleviate the pain. The patient's chest pain radiates to the left neck, left arm, abdomen, and is associated with SOB worse then baseline (has SOB regularly which she attributes to her smoking habit).    On the day of admission, the patient began having nausea and vomiting, with 10 emetic episodes. She states that the emesis has been non-bloody and non-bilious, and is watery in appearance. Denies  abdominal pain. States she has had significant diarrhea, but that this is normal for her given her diagnosis of microscopic colitis. Denies any bloody or black tarry bowel movements.   Meds: Current Outpatient Rx  Name Route Sig Dispense Refill  . CYCLOBENZAPRINE HCL 5 MG PO TABS Oral Take 5 mg by mouth 3 (three) times daily as needed. For muscle spasms    . DICLOFENAC SODIUM 1 % TD GEL Topical Apply topically.    . TRAZODONE HCL 100 MG PO TABS Oral Take 100 mg by mouth at bedtime.      Allergies: Allergies as of 07/20/2012 - Review Complete 07/20/2012  Allergen Reaction Noted  . Erythrocin Other (See Comments) 01/13/2012  . Sulfa antibiotics Other (See Comments) 01/13/2012   Past Medical History  Diagnosis Date  . Alcohol abuse last February 2012  . Drug abuse last 1990's  . Arthritis    Past Surgical History  Procedure Date  . Nose surgery   . Abdominal hysterectomy   . Tonsillectomy   . Wrist surgery   . Foot surgery    History reviewed. No pertinent family history. History   Social History  . Marital Status: Single    Spouse Name: N/A    Number of Children: N/A  . Years of Education: N/A   Occupational History  . Not on file.   Social History Main Topics  . Smoking status: Current Everyday Smoker -- 0.5 packs/day    Types: Cigars  . Smokeless tobacco: Not on file  . Alcohol Use: No  . Drug Use: No  . Sexually  Active:    Other Topics Concern  . Not on file   Social History Narrative  . No narrative on file    Review of Systems: Per HPI and below. Aside from pertinent positives, comprehensive ROS was negative.  Neuro: Patient complains of numbness/tingling in fingers/hands for the last 8 months.  General: Complains of fevers, chills. No sick contact, no recent travel  CV: palpitations Resp: SOB (worsened), cough (chronic) Abd: No change in appetite; eats once a day due to chronic dysphasia. Psyc: Worsened depression x 1-2 weeks. States she takes  flexeril and trazodone. Previously taking wellbutrin, celexa but discontinued them in the last couple of months. Severe insomnia during the night. Sleeps during the day (14 hours). Denies SI/HI, although has a history of self-harm by intentional EtOH overdose.  Physical Exam: Blood pressure 142/77, pulse 110, temperature 98.1 F (36.7 C), temperature source Oral, resp. rate 20, height 5\' 6"  (1.676 m), weight 142 lb (64.411 kg), SpO2 96.00%. BP 130/75  Pulse 110  Temp 98.1 F (36.7 C) (Oral)  Resp 18  Ht 5\' 6"  (1.676 m)  Wt 142 lb (64.411 kg)  BMI 22.92 kg/m2  SpO2 96%  General Appearance:    Alert, cooperative, no distress, appears stated age  Head:    Normocephalic, without obvious abnormality, atraumatic  Eyes:    PERRL, conjunctiva/corneas clear, EOM's intact  Nose:   Nares normal, septum midline, mucosa normal, no drainage    or sinus tenderness  Throat:   Lips, mucosa, and tongue normal; teeth and gums normal  Neck:   Supple, symmetrical, trachea midline, no adenopathy;    thyroid:  no enlargement/tenderness/nodules;  Lungs:     Bibasilar rales, L>R; otherwise CTA  Chest Wall:    TTP left lower anterior chest wall   Heart:    Sinus tachycardia; S1 and S2 normal, no murmur, rub   or gallop  Abdomen:     Soft, non-tender, bowel sounds active all four quadrants,    no masses, no organomegaly  Genitalia:    Normal female without lesion, discharge or tenderness  Extremities:   Extremities normal, atraumatic, no cyanosis or edema  Pulses:   2+ and symmetric all extremities  Skin:   Skin color, texture, turgor normal, no rashes or lesions  Lymph nodes:   Cervical, supraclavicular, and axillary nodes normal  Neurologic:   CNII-XII intact, 5/5 in RUE, RLE, LLE, 3/5 LUE; 2+ patellar and biceps DTR bilaterally, downgoing plantar reflex bilaterally; increased sensitivity to light touch on left face, most noticeable over temporal area. Decreased sensation to light touch on left arm.   Lab  results: Basic Metabolic Panel:  [facility] 07/20/12 1832  NA 133*  K 4.1  CL 90*  CO2 8*  GLUCOSE 49*  BUN 12  CREATININE 0.62  CALCIUM 9.2  MG --  PHOS --   CBC:  Basename 07/20/12 1832  WBC 15.1*  NEUTROABS --  HGB 16.0*  HCT 46.1*  MCV 92.6  PLT 207   D-Dimer:  Basename 07/20/12 2017  DDIMER 2.67*   CBG:  Basename 07/20/12 2207  GLUCAP 54*   Urinalysis:  Basename 07/20/12 2302  COLORURINE YELLOW  LABSPEC 1.025  PHURINE 5.5  GLUCOSEU NEGATIVE  HGBUR MODERATE*  BILIRUBINUR NEGATIVE  KETONESUR >80*  PROTEINUR 30*  UROBILINOGEN 0.2  NITRITE NEGATIVE  LEUKOCYTESUR NEGATIVE   Imaging results:  Dg Chest 2 View  07/20/2012  *RADIOLOGY REPORT*  Clinical Data: Chest pain.  Myalgias.  Smoker.  CHEST - 2 VIEW  Comparison: 08/28/2011.  Findings: The heart remains normal in size.  The lungs remain mildly hyperexpanded with mild diffuse peribronchial thickening and accentuation of the interstitial markings.  An increased depth of inspiration is demonstrated with associated less prominence of interstitial markings. There is a questionable nodular density in the left upper lung zone, overlying the anterior aspect of the left second rib, not previously seen.  Diffuse osteopenia.  IMPRESSION:  1.  Possible interval left upper lobe nodule or left anterior second rib fracture with nonunion.  Further evaluation with a chest CT, with contrast, is recommended. 2.  Mild changes of COPD and chronic bronchitis.  Original Report Authenticated By: Darrol Angel, M.D.   Ct Head Wo Contrast  07/20/2012  *RADIOLOGY REPORT*  Clinical Data:  headaches  CT HEAD WITHOUT CONTRAST  Technique:  Contiguous axial images were obtained from the base of the skull through the vertex without contrast  Comparison:  None.  Findings:  The brain has a normal appearance without evidence for hemorrhage, acute infarction, hydrocephalus, or mass lesion.  There is no extra axial fluid collection.  The skull and  paranasal sinuses are normal.  IMPRESSION: Normal CT of the head without contrast.  Original Report Authenticated By: Judie Petit. Ruel Favors, M.D.   Ct Angio Chest Pe W/cm &/or Wo Cm  07/20/2012  *RADIOLOGY REPORT*  Clinical Data: Left chest pain, elevated D-dimer  CT ANGIOGRAPHY CHEST  Technique:  Multidetector CT imaging of the chest using the standard protocol during bolus administration of intravenous contrast. Multiplanar reconstructed images including MIPs were obtained and reviewed to evaluate the vascular anatomy.  Contrast: OMNIPAQUE IOHEXOL 350 MG/ML SOLN  Comparison: 07/20/2012, 08/28/2011  Findings: Atherosclerosis of the thoracic aorta.  Negative for dissection or significant aneurysm.  Mild diffuse ectasia.  Major branch vessels including the brachiocephalic, left common carotid, left subclavian arteries are all patent.  Central pulmonary arteries are patent.  Negative for mediastinal hemorrhage or adenopathy.  Normal heart size.  No pericardial or pleural effusion.  Small hiatal hernia noted.  Included upper abdomen demonstrates no acute finding.  Lung windows demonstrate biapical pleural parenchymal scarring. Dependent basilar atelectasis.  Negative for pneumonia, acute airspace process, collapse, consolidation, edema, interstitial change, or central airway abnormality.  Anterior left healing 2nd rib fracture noted accounting for the chest x-ray abnormality.  No suspicious pulmonary nodule or mass.  IMPRESSION: Negative for thoracic aortic dissection.  No significant central acute pulmonary embolus  Biapical pleural scarring and bibasilar atelectasis.  No acute intrathoracic process  Original Report Authenticated By: Judie Petit. Ruel Favors, M.D.   Other results: EKG: sinus tachycardia, rate ~110bpm. No ST elevations or T-wave abnormalities.  Assessment & Plan by Problem: Patient is a 56 y.o. woman with PMH bipolar d/o, IBD (collagenous colitis), drug abuse, who presents with complaints of headache,  chest pain, N/V since 07/15/2012. On admission, she was found to have an anion-gap metabolic acidosis.  Headache - etiology unclear, although greatest concern at present time is for temporal arteritis given the patient's localization of headache to above the left eye and her associated tenderness to palpation in that area. Additionally, patient states that people have "seen [her] forehead throbbing." Patient denies history of chronic headaches. Despite photophobia, meningitis less likely as this is localized to left eye, patient is afebrile, and has no sick contacts. Intracranial hemorrhage ruled out by negative CT head. Patient denies any history of trauma. Interventions at this time will be directed towards treating possible temporal arteritis and investigating the etiology  of her headaches more thoroughly. - admit to floor on tele (per "chest pain") - monitor vitals - begin prednisone 60mg /day - check ESR - consider MRI if ESR negative, as suspicion will be higher for stroke, multiple sclerosis  Anion-gap metabolic acidosis - On admission, AG = 35. Delta(AG)/Delta(HCO3-) = 25/16 = 1.56, indicating the presence of an AG metabolic acidosis with mild, concomitant metabolic alkalosis. Appropriate respiratory compensation per results of ABG (pCO2 ~ 22). Etiology of metabolic alkalosis likely related to vomiting. Etiology of AG metabolic acidosis still under investigation. Patient denies any ingestion of EtOH, methanol, ethylene glycol. Denies taking any medications other than those she is prescribed. Lactate is elevated at 3.37mmol/L, however this still leaves 75mEq/L of her gap unaccounted for. Patient does not have a history of DM.  D-lactic acidosis is a possibility given her history of microscopic colitis. Diarrhea might be contributing to her AG metabolic acidosis as well. Interventions at this time will be directed at stabilizing patient's vomiting/diarrhea, and reassessing the issue periodically.  -  start IVF - start HCO3- drip - check UDS - continue to monitor for resolution/progression following IVF  Chest pain - unclear etiology, but likely GI-related given quality. Patient describes pain as having multiple qualities, but believes it "feels like heartburn". EKG did not reveal any ST or T-wave changes. Patient had elevated D-dimer on admission, and CTA was subsequently performed which did not reveal any evidence of pulmonary embolism or other acute thoracic abnormalities.  - CE x 3 - cardiac monitoring - TSH - risk stratification: TSH, A1c - morphine PRN - maalox PRN - zofran PRN  Nausea/vomiting - unclear etiology; seems related to HA. On the differential is gastroenteritis (patient has chronic diarrhea/loose stool which is not acutely worsened). EGD in 06/2010 by Dr. Elnoria Howard revealed mild gastritis in antrum and was otherwise unrevealing. Interventions will be directed at further investigating etiology, treating symptomatically, and monitoring for progression. - zofran PRN  Collagenous colitis (microscopic colitis) - stable per patient's report, although she states she has been prescribed 3 medications for this issue which she is not currently taking and is unsure of the names/doses/frequencies of these meds.  - consider attaining records from Dr. Haywood Pao office   Signed: Elenor Legato, M.D. 07/21/2012, 1:53AM

## 2012-07-22 DIAGNOSIS — E872 Acidosis, unspecified: Principal | ICD-10-CM

## 2012-07-22 DIAGNOSIS — R51 Headache: Secondary | ICD-10-CM

## 2012-07-22 DIAGNOSIS — R079 Chest pain, unspecified: Secondary | ICD-10-CM

## 2012-07-22 LAB — GLUCOSE, CAPILLARY: Glucose-Capillary: 134 mg/dL — ABNORMAL HIGH (ref 70–99)

## 2012-07-22 LAB — BASIC METABOLIC PANEL
Calcium: 8.8 mg/dL (ref 8.4–10.5)
Chloride: 100 mEq/L (ref 96–112)
Creatinine, Ser: 0.44 mg/dL — ABNORMAL LOW (ref 0.50–1.10)
Creatinine, Ser: 0.56 mg/dL (ref 0.50–1.10)
GFR calc Af Amer: >90 mL/min (ref >90)
GFR calc Af Amer: >90 mL/min (ref >90)
Potassium: 3.2 mEq/L — ABNORMAL LOW (ref 3.5–5.1)
Sodium: 140 mEq/L (ref 135–145)
Sodium: 141 mEq/L (ref 135–145)

## 2012-07-22 LAB — CBC WITH DIFFERENTIAL/PLATELET
Basophils Absolute: 0 10*3/uL (ref 0.0–0.1)
Lymphocytes Relative: 40 % (ref 12–46)
Lymphs Abs: 4.4 10*3/uL — ABNORMAL HIGH (ref 0.7–4.0)
Neutro Abs: 6 10*3/uL (ref 1.7–7.7)
Neutrophils Relative %: 54 % (ref 43–77)
Platelets: 199 10*3/uL (ref 150–400)
RBC: 4.14 MIL/uL (ref 3.87–5.11)
RDW: 13.1 % (ref 11.5–15.5)
WBC: 11.1 10*3/uL — ABNORMAL HIGH (ref 4.0–10.5)

## 2012-07-22 MED ORDER — POTASSIUM CHLORIDE 20 MEQ/15ML (10%) PO LIQD
40.0000 meq | ORAL | Status: AC
  Administered 2012-07-22 (×2): 40 meq via ORAL
  Filled 2012-07-22 (×3): qty 30

## 2012-07-22 MED ORDER — POTASSIUM CHLORIDE CRYS ER 20 MEQ PO TBCR
40.0000 meq | EXTENDED_RELEASE_TABLET | ORAL | Status: DC
  Administered 2012-07-22: 40 meq via ORAL
  Filled 2012-07-22 (×3): qty 2

## 2012-07-22 MED ORDER — TRAZODONE HCL 100 MG PO TABS
100.0000 mg | ORAL_TABLET | Freq: Every day | ORAL | Status: DC
  Administered 2012-07-22 (×2): 100 mg via ORAL
  Filled 2012-07-22 (×3): qty 1

## 2012-07-22 MED ORDER — LORAZEPAM 0.5 MG PO TABS
0.5000 mg | ORAL_TABLET | Freq: Two times a day (BID) | ORAL | Status: DC | PRN
  Administered 2012-07-22 – 2012-07-23 (×3): 1 mg via ORAL
  Filled 2012-07-22 (×3): qty 2

## 2012-07-22 MED ORDER — CYCLOBENZAPRINE HCL 5 MG PO TABS
5.0000 mg | ORAL_TABLET | ORAL | Status: DC
  Administered 2012-07-23 (×2): 5 mg via ORAL
  Filled 2012-07-22 (×3): qty 1

## 2012-07-22 MED ORDER — CYCLOBENZAPRINE HCL 5 MG PO TABS
5.0000 mg | ORAL_TABLET | Freq: Three times a day (TID) | ORAL | Status: DC
  Administered 2012-07-22: 10 mg via ORAL
  Filled 2012-07-22 (×3): qty 2

## 2012-07-22 MED ORDER — CYCLOBENZAPRINE HCL 10 MG PO TABS
10.0000 mg | ORAL_TABLET | Freq: Every day | ORAL | Status: DC
  Administered 2012-07-22: 10 mg via ORAL
  Filled 2012-07-22: qty 1

## 2012-07-22 NOTE — Progress Notes (Signed)
Subjective: Anxious this morning.  States she heard yesterday that her mother in law passed away and also today that another friend was sick.  States she also feels the need for a cigarette.  Denies chest pain, nausea, or vomiting.  Had a hard time swallowing the KCl pill.  Eating her meal and drinking the Breeze supplement.   Objective: Vital signs in last 24 hours: Filed Vitals:   07/21/12 1900 07/21/12 2116 07/22/12 0138 07/22/12 0518  BP: 123/56 132/68 127/65 134/67  Pulse: 80 71 77 68  Temp: 98.3 F (36.8 C) 97.9 F (36.6 C) 97.8 F (36.6 C) 97.6 F (36.4 C)  TempSrc: Oral Oral Oral Oral  Resp: 18 18 18 18   Height:      Weight:      SpO2: 98% 97% 96% 93%   Weight change:   Intake/Output Summary (Last 24 hours) at 07/22/12 1103 Last data filed at 07/21/12 2210  Gross per 24 hour  Intake 2388.67 ml  Output      0 ml  Net 2388.67 ml   Vitals reviewed. General: resting in bed, NAD, appears older then stated age HEENT: PERRL, EOMI, no scleral icterus Cardiac: RRR, no rubs, murmurs or gallops Pulm: clear to auscultation bilaterally, no wheezes, rales, or rhonchi Abd: soft, nontender, mildly distended, BS present Ext: warm and well perfused, no pedal edema Neuro: alert and oriented X3, cranial nerves II-XII grossly intact, strength and sensation to light touch equal in bilateral upper and lower extremities  Lab Results: Basic Metabolic Panel:  Lab 07/22/12 1610 07/21/12 1717 07/21/12 0153 07/21/12 0114  NA 140 130* -- --  K 2.6* 3.4* -- --  CL 99 93* -- --  CO2 34* 26 -- --  GLUCOSE 136* 418* -- --  BUN 8 11 -- --  CREATININE 0.44* 0.43* -- --  CALCIUM 8.8 8.4 -- --  MG -- -- -- 1.5  PHOS -- -- 2.2* --   Liver Function Tests:  Lab 07/21/12 0114  AST 30  ALT 16  ALKPHOS 84  BILITOT 0.4  PROT 7.0  ALBUMIN 3.4*    Lab 07/21/12 0613  LIPASE 19  AMYLASE --   CBC:  Lab 07/22/12 0545 07/21/12 0613  WBC 11.1* 12.2*  NEUTROABS 6.0 --  HGB 12.8 13.2  HCT  36.5 39.6  MCV 88.2 93.0  PLT 199 217   Cardiac Enzymes:  Lab 07/21/12 1047 07/21/12 0616 07/21/12 0114  CKTOTAL 177 158 119  CKMB 5.1* 4.7* 4.2*  CKMBINDEX -- -- --  TROPONINI <0.30 <0.30 <0.30   D-Dimer:  Lab 07/20/12 2017  DDIMER 2.67*   CBG:  Lab 07/22/12 0809 07/21/12 2353 07/21/12 0428 07/21/12 0033 07/20/12 2207  GLUCAP 134* 198* 96 94 54*   Hemoglobin A1C:  Lab 07/21/12 0114  HGBA1C 5.3   Fasting Lipid Panel:  Lab 07/21/12 0114  CHOL 272*  HDL 35*  LDLCALC UNABLE TO CALCULATE IF TRIGLYCERIDE OVER 400 mg/dL  TRIG 960*  CHOLHDL 7.8  LDLDIRECT --   Thyroid Function Tests:  Lab 07/21/12 0114  TSH 1.592  T4TOTAL --  FREET4 --  T3FREE --  THYROIDAB --   Drugs of Abuse     Component Value Date/Time   LABOPIA POSITIVE* 07/20/2012 2302   COCAINSCRNUR NONE DETECTED 07/20/2012 2302   LABBENZ NONE DETECTED 07/20/2012 2302   AMPHETMU NONE DETECTED 07/20/2012 2302   THCU NONE DETECTED 07/20/2012 2302   LABBARB NONE DETECTED 07/20/2012 2302    Alcohol Level:  Lab 07/21/12  4098  ETH <11   Urinalysis:  Lab 07/20/12 2302  COLORURINE YELLOW  LABSPEC 1.025  PHURINE 5.5  GLUCOSEU NEGATIVE  HGBUR MODERATE*  BILIRUBINUR NEGATIVE  KETONESUR >80*  PROTEINUR 30*  UROBILINOGEN 0.2  NITRITE NEGATIVE  LEUKOCYTESUR NEGATIVE   Misc. Labs: Serum Acetone: Large D Lactate: pending  Studies/Results: Dg Chest 2 View  07/20/2012  *RADIOLOGY REPORT*  Clinical Data: Chest pain.  Myalgias.  Smoker.  CHEST - 2 VIEW  Comparison: 08/28/2011.  Findings: The heart remains normal in size.  The lungs remain mildly hyperexpanded with mild diffuse peribronchial thickening and accentuation of the interstitial markings.  An increased depth of inspiration is demonstrated with associated less prominence of interstitial markings. There is a questionable nodular density in the left upper lung zone, overlying the anterior aspect of the left second rib, not previously seen.  Diffuse osteopenia.   IMPRESSION:  1.  Possible interval left upper lobe nodule or left anterior second rib fracture with nonunion.  Further evaluation with a chest CT, with contrast, is recommended. 2.  Mild changes of COPD and chronic bronchitis.  Original Report Authenticated By: Darrol Angel, M.D.   Ct Head Wo Contrast  07/20/2012  *RADIOLOGY REPORT*  Clinical Data:  headaches  CT HEAD WITHOUT CONTRAST  Technique:  Contiguous axial images were obtained from the base of the skull through the vertex without contrast  Comparison:  None.  Findings:  The brain has a normal appearance without evidence for hemorrhage, acute infarction, hydrocephalus, or mass lesion.  There is no extra axial fluid collection.  The skull and paranasal sinuses are normal.  IMPRESSION: Normal CT of the head without contrast.  Original Report Authenticated By: Judie Petit. Ruel Favors, M.D.   Ct Angio Chest Pe W/cm &/or Wo Cm  07/20/2012  *RADIOLOGY REPORT*  Clinical Data: Left chest pain, elevated D-dimer  CT ANGIOGRAPHY CHEST  Technique:  Multidetector CT imaging of the chest using the standard protocol during bolus administration of intravenous contrast. Multiplanar reconstructed images including MIPs were obtained and reviewed to evaluate the vascular anatomy.  Contrast: OMNIPAQUE IOHEXOL 350 MG/ML SOLN  Comparison: 07/20/2012, 08/28/2011  Findings: Atherosclerosis of the thoracic aorta.  Negative for dissection or significant aneurysm.  Mild diffuse ectasia.  Major branch vessels including the brachiocephalic, left common carotid, left subclavian arteries are all patent.  Central pulmonary arteries are patent.  Negative for mediastinal hemorrhage or adenopathy.  Normal heart size.  No pericardial or pleural effusion.  Small hiatal hernia noted.  Included upper abdomen demonstrates no acute finding.  Lung windows demonstrate biapical pleural parenchymal scarring. Dependent basilar atelectasis.  Negative for pneumonia, acute airspace process, collapse,  consolidation, edema, interstitial change, or central airway abnormality.  Anterior left healing 2nd rib fracture noted accounting for the chest x-ray abnormality.  No suspicious pulmonary nodule or mass.  IMPRESSION: Negative for thoracic aortic dissection.  No significant central acute pulmonary embolus  Biapical pleural scarring and bibasilar atelectasis.  No acute intrathoracic process  Original Report Authenticated By: Judie Petit. Ruel Favors, M.D.   Medications: I have reviewed the patient's current medications. Scheduled Meds:   . cyclobenzaprine  5-10 mg Oral TID  . enoxaparin (LOVENOX) injection  40 mg Subcutaneous Q24H  . feeding supplement  1 Container Oral TID BM  . nicotine  21 mg Transdermal Daily  . pantoprazole  40 mg Oral Q1200  . potassium chloride  40 mEq Oral Q4H  . sodium chloride  3 mL Intravenous Q12H  . traZODone  100 mg Oral QHS   Continuous Infusions:   . sodium chloride 75 mL/hr at 07/22/12 0441  . DISCONTD: dextrose 5 % 1,000 mL with sodium bicarbonate 150 mEq infusion 100 mL/hr at 07/21/12 1717   PRN Meds:.alum & mag hydroxide-simeth, LORazepam, morphine injection, ondansetron (ZOFRAN) IV, ondansetron, polyvinyl alcohol, DISCONTD: traZODone  Assessment/Plan: 1. Metabolic acidosis, increased anion gap: Monique Perry initially presented with headache, chest pain, nausea, and vomiting for several days. Her anion gap on admission was 35, and has since improved to 20 with a delta(AG)/delta(HCO3-) of 1.56 with appropriate respiratory compensation. In turn, her symptoms have improved markedly as well. Initial differential diagnosis was broad and included toxic ingestions, alcoholic ketoacidosis, starvation ketoacidosis, or D-lactic acidosis. She states that she has been extremely busy for the last week moving that she has "kinda forgotten to eat." She states that she usually only eats 1 meal per day and that she "may have forgotten" to eat for the last few days. She states that the  last time she remembers for sure eating was Monday around noon. She stopped using alcohol after several life-threatening episodes last year, and says she is "addicted" to support groups. She states that she has been sober since February 29th 2012 and goes to 2 AA meetings per day. She appears reliable in this capacity. Serum ketones returned markedly positive which makes D-lactic acidosis less likely.  Gap closed on the Bmet on 8/9 pm so she was changed from D5 with 3 amps of Bicarb to NS at 75 ml/hr.  She has been eating better and drinking well.  Mildly anxious today but has a baseline anxiety vs bipolar history.  No other signs of alcohol withdrawal at this time.   - continue to monitor for symptoms of alcohol withdrawal given her history and clinical   presentation of ketoacidosis. Her history of poor nutrition is most likely the underlying cause  for her acidosis. She has lost 25 lbs in the past 3 months and has markedly decreased   nutritional intake.   - Appreciate nutrition recommendations. Start resource breeze 1 container 3 times daily   between meals   - Gap is closed and was swapped to NS last night  2. Headache: On presentation she had some interesting neurologic manifestations that did not follow any vascular pattern to them. She had denied any history of trauma. Due to concern of temporal arteritis, she received 60 mg prednisone in the ED. With an ESR of 10, temporal arteritis is unlikely. Negative CT clears concern for CVA. She has had some mild return of the headache this morning when she started to have her panic symptoms.    -morphine   - add back her home Flexeril for pain.  3. Chest pain: On admission she was complaining of epigastric pain that radiated up to her left chest. Pain was worse with vomiting Etiology most likely GI related. Patient has a significant history of GERD and has not been taking her home omeprazole as directed. Negative EKG, CTA, CEx3, TSH.  Could have also been  secondary to panic disorder.   - Continue to monitor and treat her anxiety and GERD  4. Tobacco abuse: Patient says she is ready to quit. She has used the patch before but this was not helpful.   -Discussed using ecigarettes with the patient, which she seemed to intrigue her   - order smoking cessation counseling   5. Nausea/Vomiting: Appears to have cleared with the patient having tolerated lunch.   -  Continue zofran 4 mg Q4H PRN   6. Collagenous colitis: per patient, this condition is stable. She has been prescribed 3 medications for this issue which she is not currently taking and is unsure of the names/doses/frequencies of these meds. She received 1 dose of metoclopramide in the ED for gut stimulation.   - Record request has been sent to [facility] (Dr. Elnoria Howard) still pending a response  7. History of Bipolar disorder: Patients mood with worsening anxiety this morning.  She does not want to restart her medications without follow up at [facility].  We will give her a small dose of ativan for panic and continue her trazodone 100 mg PO QHS and have her follow up as an outpatient.  She also has significant social stressors that may be driving the increased anxiety as well.   8. Dispo: The patient's symptoms have markedly improved since admission.  We will replete her K today and likely D/C in the morning.  [facility]. Gunnels was [facility] and needs a new PCP.  - appt with Dr. Everardo Beals on 07/26/12 at 11:15 AM to establish care and for hospital f/u   LOS: 2 days   Marquerite Forsman 07/22/2012, 11:03 AM

## 2012-07-22 NOTE — Progress Notes (Signed)
Lab called and reported that patient had a critical lab value K+ 2.9, Notified MD at [phone] via phone

## 2012-07-23 LAB — CBC WITH DIFFERENTIAL/PLATELET
Basophils Absolute: 0 10*3/uL (ref 0.0–0.1)
Basophils Relative: 0 % (ref 0–1)
Hemoglobin: 12.5 g/dL (ref 12.0–15.0)
Lymphocytes Relative: 39 % (ref 12–46)
MCHC: 33.9 g/dL (ref 30.0–36.0)
Monocytes Relative: 5 % (ref 3–12)
Neutro Abs: 5.1 10*3/uL (ref 1.7–7.7)
Neutrophils Relative %: 54 % (ref 43–77)
WBC: 9.4 10*3/uL (ref 4.0–10.5)

## 2012-07-23 LAB — BASIC METABOLIC PANEL
BUN: 8 mg/dL (ref 6–23)
GFR calc Af Amer: >90 mL/min (ref >90)
GFR calc non Af Amer: >90 mL/min (ref >90)
Potassium: 4 mEq/L (ref 3.5–5.1)
Sodium: 138 mEq/L (ref 135–145)

## 2012-07-23 MED ORDER — BOOST / RESOURCE BREEZE PO LIQD: 1.0000 | Freq: Three times a day (TID) | ORAL | Status: DC

## 2012-07-23 MED ORDER — PANTOPRAZOLE SODIUM 40 MG PO TBEC: 40.0000 mg | DELAYED_RELEASE_TABLET | Freq: Every day | ORAL | Status: DC

## 2012-07-23 MED ORDER — LORAZEPAM 0.5 MG PO TABS: 0.5000 mg | ORAL_TABLET | Freq: Two times a day (BID) | ORAL | Status: AC | PRN

## 2012-07-23 MED ORDER — CYCLOBENZAPRINE HCL 5 MG PO TABS: ORAL_TABLET | ORAL | Status: DC

## 2012-07-23 MED ORDER — NICOTINE 21 MG/24HR TD PT24: 1.0000 | MEDICATED_PATCH | Freq: Every day | TRANSDERMAL | Status: AC

## 2012-07-23 NOTE — Discharge Summary (Signed)
Internal Medicine Teaching [facility] Discharge Note  Name: Monique Perry MRN: [id number] DOB: 21-Nov-1956 56 y.o.  Date of Admission: 07/20/2012  7:45 PM Date of Discharge: 07/23/2012 Attending Physician: Ulyess Mort, MD  Discharge Diagnosis: 1.  Anion gap Metabolic acidosis, likely starvation ketoacidosis 2.  Headache 3.  Chest pain 4. Tobacco abuse 5. Nausea and vomiting 6. Collagenous colitis 7. Hx of bipolar disorder  Discharge Medications: Medication List  As of 07/24/2012  4:39 PM   TAKE these medications         cyclobenzaprine 5 MG tablet   Commonly known as: FLEXERIL   Take 1 tablet twice daily with 2 tablets at bedtime as needed for muscle spasm      diclofenac sodium 1 % Gel   Commonly known as: VOLTAREN   Apply topically.      feeding supplement Liqd   Take 1 Container by mouth 3 (three) times daily between meals.      LORazepam 0.5 MG tablet   Commonly known as: ATIVAN   Take 1-2 tablets (0.5-1 mg total) by mouth 2 (two) times daily as needed.      nicotine 21 mg/24hr patch   Commonly known as: NICODERM CQ - dosed in mg/24 hours   Place 1 patch onto the skin daily.      pantoprazole 40 MG tablet   Commonly known as: PROTONIX   Take 1 tablet (40 mg total) by mouth daily at 12 noon.      traZODone 100 MG tablet   Commonly known as: DESYREL   Take 100 mg by mouth at bedtime.           Disposition and follow-up:   Ms.Loralyn Mahaffy was discharged from [facility] in Good condition.  At the hospital follow up visit please address.  1. Follow-up visits: 1. Assess nutritional status, bowel movement history 2. Medication compliance 3. Schedule follow-up visit with Dr. Jeani Hawking 4. Headache frequency, neurologic deficits 5. Chest pain/GERD symptoms 6. Mood, assess for any manic episodes. Has there been follow-up scheduled 7. Smoking cessation 2. Labs / imaging needed: 1. F/u BMet  Follow-up Appointments: Follow-up Information     Follow up with Vernice Jefferson, MD on 07/26/2012. (at 11:15 AM)    Contact information:   [address] [place] [address] [phone]       Call [place]. (to make a follow up appointment)       Schedule an appointment as soon as possible for a visit with HUNG,PATRICK D, MD. (to follow up. )    Contact information:   [address], [address] [place] [address] [phone]         Discharge Orders    Future Appointments: Provider: Department: Dept Phone: Center:   07/26/2012 11:15 AM Belia Heman, MD [facility] [phone] [facility]     Future Orders Please Complete By Expires   Diet general      Increase activity slowly      Discharge instructions      Comments:   1. Make sure you are eating at 3 meals per day.    2.  Continue the Breeze supplements between meals for now  3.  Continue your other medications as prescribed.  4.  Follow up with your new doctor at the [facility].  5.  Make sure you call Dr. Elnoria Howard for an appointment to follow up  6.  Make sure you  follow up with [facility] for your mood and anxiety.     Consultations: None  Procedures Performed:  Dg Chest 2 View  07/20/2012  *RADIOLOGY REPORT*  Clinical Data: Chest pain.  Myalgias.  Smoker.  CHEST - 2 VIEW  Comparison: 08/28/2011.  Findings: The heart remains normal in size.  The lungs remain mildly hyperexpanded with mild diffuse peribronchial thickening and accentuation of the interstitial markings.  An increased depth of inspiration is demonstrated with associated less prominence of interstitial markings. There is a questionable nodular density in the left upper lung zone, overlying the anterior aspect of the left second rib, not previously seen.  Diffuse osteopenia.  IMPRESSION:  1.  Possible interval left upper lobe nodule or left anterior second rib fracture with nonunion.  Further evaluation with a chest CT, with contrast, is  recommended. 2.  Mild changes of COPD and chronic bronchitis.  Original Report Authenticated By: Darrol Angel, M.D.   Ct Head Wo Contrast  07/20/2012  *RADIOLOGY REPORT*  Clinical Data:  headaches  CT HEAD WITHOUT CONTRAST  Technique:  Contiguous axial images were obtained from the base of the skull through the vertex without contrast  Comparison:  None.  Findings:  The brain has a normal appearance without evidence for hemorrhage, acute infarction, hydrocephalus, or mass lesion.  There is no extra axial fluid collection.  The skull and paranasal sinuses are normal.  IMPRESSION: Normal CT of the head without contrast.  Original Report Authenticated By: Judie Petit. Ruel Favors, M.D.   Ct Angio Chest Pe W/cm &/or Wo Cm  07/20/2012  *RADIOLOGY REPORT*  Clinical Data: Left chest pain, elevated D-dimer  CT ANGIOGRAPHY CHEST  Technique:  Multidetector CT imaging of the chest using the standard protocol during bolus administration of intravenous contrast. Multiplanar reconstructed images including MIPs were obtained and reviewed to evaluate the vascular anatomy.  Contrast: OMNIPAQUE IOHEXOL 350 MG/ML SOLN  Comparison: 07/20/2012, 08/28/2011  Findings: Atherosclerosis of the thoracic aorta.  Negative for dissection or significant aneurysm.  Mild diffuse ectasia.  Major branch vessels including the brachiocephalic, left common carotid, left subclavian arteries are all patent.  Central pulmonary arteries are patent.  Negative for mediastinal hemorrhage or adenopathy.  Normal heart size.  No pericardial or pleural effusion.  Small hiatal hernia noted.  Included upper abdomen demonstrates no acute finding.  Lung windows demonstrate biapical pleural parenchymal scarring. Dependent basilar atelectasis.  Negative for pneumonia, acute airspace process, collapse, consolidation, edema, interstitial change, or central airway abnormality.  Anterior left healing 2nd rib fracture noted accounting for the chest x-ray abnormality.  No  suspicious pulmonary nodule or mass.  IMPRESSION: Negative for thoracic aortic dissection.  No significant central acute pulmonary embolus  Biapical pleural scarring and bibasilar atelectasis.  No acute intrathoracic process  Original Report Authenticated By: Judie Petit. Ruel Favors, M.D.   Admission HPI: Patient is a 56 y.o. woman with PMH bipolar d/o, IBD (collagenous colitis), drug abuse, who presents with complaints of headache, chest pain, N/V since 07/15/2012. The first symptom she noticed was headache, which started 6 days prior to admission, is left-sided, above the left eye, throbbing and pressure-like in character, with pain that is constant. She has had blurry vision in the left eye, left eye weakness , dizziness, and generalized weakness associated with this HA since it began. She also admits to photophobia and phonophobia for the last 4-5 days. The photophobia is limited to the left eye. She has had associated blurry vision in this eye, but no identifiable  vision loss.  The patient also complains of chest pain which began one day after the headache, is stabbing/pressure/aching in character, 8/10 in severity, and is substernal in location. The pain has been constant during this time, and is mildly improved at rest (patient denies worsening with exertion). She did not take any medication to help alleviate the pain. The patient's chest pain radiates to the left neck, left arm, abdomen, and is associated with SOB worse then baseline (has SOB regularly which she attributes to her smoking habit).  On the day of admission, the patient began having nausea and vomiting, with 10 emetic episodes. She states that the emesis has been non-bloody and non-bilious, and is watery in appearance. Denies abdominal pain. States she has had significant diarrhea, but that this is normal for her given her diagnosis of microscopic colitis. Denies any bloody or black tarry bowel movements.   Hospital Course by problem list: 1.  Metabolic acidosis, increased anion gap: Ms. Franzen initially presented with headache, chest pain, nausea, and vomiting for several days. Her anion gap on admission was 35, and improved to 4 on discharge with appropriate respiratory compensation. Her symptoms have improved markedly as well. Initial differential diagnosis was broad and included toxic ingestions, alcoholic ketoacidosis, starvation ketoacidosis, or D-lactic acidosis. She states that she has been extremely busy for the last week moving that she "kinda forgot to eat." She states that she usually only eats 1 meal per day and that she had not eaten for the last few days. She states that the last time she remembers for sure eating was Monday around noon. She stopped using alcohol after several life-threatening episodes last year, and says she is "addicted" to support groups. She states that she has been sober since February 29th 2012 and goes to 2 AA meetings per day. She appears reliable in this capacity. Serum ketones returned markedly positive which made D-lactic acidosis less likely. Gap closed on the Bmet on 8/9 in the afternoon so she was changed from D5 with 3 amps of Bicarb to NS at 75 ml/hr. On the day of discharge she was eating and drinking well. She has a baseline anxiety vs bipolar history but showed no acute signs of alcohol withdrawal. Her history of poor nutrition is most likely the underlying cause for her acidosis. She has lost 25 lbs in the past 3 months and has markedly decreased nutritional intake. Nutrition recommended to start resource breeze 1 container 3 times daily between meals. She was sent home with these nutritional recommendations.   2. Headache: On presentation she had neurologic manifestations that did not follow any vascular pattern to them. She denied any history of trauma. Due to concern of temporal arteritis, she received 60 mg prednisone in the ED. With an ESR of 10, temporal arteritis is unlikely. Negative CT cleared  concern for CVA. She has had some mild return of the headache the morning prior to discharge when she started to have her panic symptoms. She had marked improvement in headache and muscle aches with Flexeril and sleep. She was discharged on her previous home Flexeril for pain.   3. Chest pain: On admission she was complaining of epigastric pain that radiated up to her left chest. Pain was worse with vomiting. Etiology most likely GI related. Patient has a significant history of GERD and has not been taking her home omeprazole as directed. Negative EKG, CTA, CEx3, TSH. Could have also been secondary to panic disorder. She was started on Protonix 40 mg  PO QDay while in the hospital and discharged with a home Protonix prescription.   4. Tobacco abuse: Patient says she is ready to quit. She has used the patch before but this was not helpful. The team discussed using ecigarettes with the patient, which she seemed to intrigue her. Smoking cessation counseling was performed during the hospitalization. She was discharged with a nicotine patch   5. Nausea/Vomiting: Appears to have cleared with the patient having tolerated all her meals since the morning after admission.   6. Collagenous colitis: per patient, this condition is stable. She has been prescribed 3 medications for this issue which she is not currently taking and is unsure of the names/doses/frequencies of these meds. She received 1 dose of metoclopramide in the ED for gut stimulation. She will need follow up with Dr. Elnoria Howard as an outpatient.   7. History of Bipolar disorder: Patients mood with worsening anxiety the morning prior to discharge but is better today. She does not want to restart her medications without follow up at [facility]. We will give her a small dose of ativan for panic and continue her trazodone 100 mg PO QHS and have her follow up as an outpatient. She also has significant social stressors that may be driving the increased anxiety as  well.  Discharge Vitals:  BP 120/72  Pulse 94  Temp 98.2 F (36.8 C) (Oral)  Resp 18  Ht 5\' 6"  (1.676 m)  Wt 145 lb (65.772 kg)  BMI 23.40 kg/m2  SpO2 94%  Discharge Labs:  Results for orders placed during the hospital encounter of 07/20/12 (from the past 24 hour(s))  BASIC METABOLIC PANEL     Status: Abnormal   Collection Time   07/23/12  5:53 AM      Component Value Range   Sodium 138  135 - 145 mEq/L   Potassium 4.0  3.5 - 5.1 mEq/L   Chloride 105  96 - 112 mEq/L   CO2 29  19 - 32 mEq/L   Glucose, Bld 103 (*) 70 - 99 mg/dL   BUN 8  6 - 23 mg/dL   Creatinine, Ser 1.61  0.50 - 1.10 mg/dL   Calcium 8.9  8.4 - 09.6 mg/dL   GFR calc non Af Amer >90  >90 mL/min   GFR calc Af Amer >90  >90 mL/min  CBC WITH DIFFERENTIAL     Status: Normal   Collection Time   07/23/12  5:53 AM      Component Value Range   WBC 9.4  4.0 - 10.5 K/uL   RBC 4.06  3.87 - 5.11 MIL/uL   Hemoglobin 12.5  12.0 - 15.0 g/dL   HCT 04.5  40.9 - 81.1 %   MCV 90.9  78.0 - 100.0 fL   MCH 30.8  26.0 - 34.0 pg   MCHC 33.9  30.0 - 36.0 g/dL   RDW 91.4  78.2 - 95.6 %   Platelets 174  150 - 400 K/uL   Neutrophils Relative 54  43 - 77 %   Neutro Abs 5.1  1.7 - 7.7 K/uL   Lymphocytes Relative 39  12 - 46 %   Lymphs Abs 3.7  0.7 - 4.0 K/uL   Monocytes Relative 5  3 - 12 %   Monocytes Absolute 0.5  0.1 - 1.0 K/uL   Eosinophils Relative 1  0 - 5 %   Eosinophils Absolute 0.1  0.0 - 0.7 K/uL   Basophils Relative 0  0 - 1 %  Basophils Absolute 0.0  0.0 - 0.1 K/uL   Signed: Kelin Nixon 07/23/2012, 2:53 PM   Time Spent on Discharge: 45 min

## 2012-07-23 NOTE — Progress Notes (Signed)
Subjective: Feeling better today.  Eating well and drinking the supplementations.  Still mildly anxious but states that it is better today.  Slept better last night.    Objective: Vital signs in last 24 hours: Filed Vitals:   07/22/12 1915 07/22/12 2113 07/22/12 2320 07/23/12 0317  BP: 110/63 101/60 129/69 120/72  Pulse: 82 87 92 94  Temp: 98.3 F (36.8 C) 98.6 F (37 C) 98.6 F (37 C) 98.2 F (36.8 C)  TempSrc: Oral Oral Oral Oral  Resp: 18 16 18 18   Height:      Weight:      SpO2: 94% 95% 96% 94%   Weight change:   Intake/Output Summary (Last 24 hours) at 07/23/12 1410 Last data filed at 07/22/12 1900  Gross per 24 hour  Intake    240 ml  Output      0 ml  Net    240 ml   Vitals reviewed. General: resting in bed, NAD HEENT: PERRL, EOMI, no scleral icterus Cardiac: RRR, no rubs, murmurs or gallops Pulm: clear to auscultation bilaterally, no wheezes, rales, or rhonchi Abd: soft, nontender, nondistended, BS present Ext: warm and well perfused, no pedal edema Neuro: alert and oriented X3, cranial nerves II-XII grossly intact, strength and sensation to light touch equal in bilateral upper and lower extremities  Lab Results: Basic Metabolic Panel:  Lab 07/23/12 0454 07/22/12 1133 07/21/12 0153 07/21/12 0114  NA 138 141 -- --  K 4.0 3.2* -- --  CL 105 100 -- --  CO2 29 32 -- --  GLUCOSE 103* 114* -- --  BUN 8 7 -- --  CREATININE 0.53 0.56 -- --  CALCIUM 8.9 8.8 -- --  MG -- -- -- 1.5  PHOS -- -- 2.2* --   Liver Function Tests:  Lab 07/21/12 0114  AST 30  ALT 16  ALKPHOS 84  BILITOT 0.4  PROT 7.0  ALBUMIN 3.4*    Lab 07/21/12 0613  LIPASE 19  AMYLASE --   CBC:  Lab 07/23/12 0553 07/22/12 0545  WBC 9.4 11.1*  NEUTROABS 5.1 6.0  HGB 12.5 12.8  HCT 36.9 36.5  MCV 90.9 88.2  PLT 174 199   Cardiac Enzymes:  Lab 07/21/12 1047 07/21/12 0616 07/21/12 0114  CKTOTAL 177 158 119  CKMB 5.1* 4.7* 4.2*  CKMBINDEX -- -- --  TROPONINI <0.30 <0.30 <0.30    D-Dimer:  Lab 07/20/12 2017  DDIMER 2.67*   CBG:  Lab 07/22/12 1224 07/22/12 0809 07/21/12 2353 07/21/12 0428 07/21/12 0033 07/20/12 2207  GLUCAP 134* 134* 198* 96 94 54*   Hemoglobin A1C:  Lab 07/21/12 0114  HGBA1C 5.3   Fasting Lipid Panel:  Lab 07/21/12 0114  CHOL 272*  HDL 35*  LDLCALC UNABLE TO CALCULATE IF TRIGLYCERIDE OVER 400 mg/dL  TRIG 098*  CHOLHDL 7.8  LDLDIRECT --   Thyroid Function Tests:  Lab 07/21/12 0114  TSH 1.592  T4TOTAL --  FREET4 --  T3FREE --  THYROIDAB --   Drugs of Abuse     Component Value Date/Time   LABOPIA POSITIVE* 07/20/2012 2302   COCAINSCRNUR NONE DETECTED 07/20/2012 2302   LABBENZ NONE DETECTED 07/20/2012 2302   AMPHETMU NONE DETECTED 07/20/2012 2302   THCU NONE DETECTED 07/20/2012 2302   LABBARB NONE DETECTED 07/20/2012 2302    Alcohol Level:  Lab 07/21/12 0613  ETH <11   Urinalysis:  Lab 07/20/12 2302  COLORURINE YELLOW  LABSPEC 1.025  PHURINE 5.5  GLUCOSEU NEGATIVE  HGBUR MODERATE*  BILIRUBINUR  NEGATIVE  KETONESUR >80*  PROTEINUR 30*  UROBILINOGEN 0.2  NITRITE NEGATIVE  LEUKOCYTESUR NEGATIVE   Misc. Labs: D Lactate: Pending  Medications: I have reviewed the patient's current medications. Scheduled Meds:   . cyclobenzaprine  10 mg Oral QHS  . cyclobenzaprine  5 mg Oral Custom  . enoxaparin (LOVENOX) injection  40 mg Subcutaneous Q24H  . feeding supplement  1 Container Oral TID BM  . nicotine  21 mg Transdermal Daily  . pantoprazole  40 mg Oral Q1200  . potassium chloride  40 mEq Oral Q4H  . sodium chloride  3 mL Intravenous Q12H  . traZODone  100 mg Oral QHS  . DISCONTD: cyclobenzaprine  5-10 mg Oral TID   Continuous Infusions:   . sodium chloride 75 mL/hr at 07/23/12 0817   PRN Meds:.alum & mag hydroxide-simeth, LORazepam, morphine injection, ondansetron (ZOFRAN) IV, ondansetron, polyvinyl alcohol  Assessment/Plan: 1. Metabolic acidosis, increased anion gap: Ms. Leverett initially presented with headache,  chest pain, nausea, and vomiting for several days. Her anion gap on admission was 35, and has since improved to 20 with a delta(AG)/delta(HCO3-) of 1.56 with appropriate respiratory compensation. In turn, her symptoms have improved markedly as well. Initial differential diagnosis was broad and included toxic ingestions, alcoholic ketoacidosis, starvation ketoacidosis, or D-lactic acidosis. She states that she has been extremely busy for the last week moving that she has "kinda forgotten to eat." She states that she usually only eats 1 meal per day and that she "may have forgotten" to eat for the last few days. She states that the last time she remembers for sure eating was Monday around noon. She stopped using alcohol after several life-threatening episodes last year, and says she is "addicted" to support groups. She states that she has been sober since February 29th 2012 and goes to 2 AA meetings per day. She appears reliable in this capacity. Serum ketones returned markedly positive which makes D-lactic acidosis less likely. Gap closed on the Bmet on 8/9 pm so she was changed from D5 with 3 amps of Bicarb to NS at 75 ml/hr. She has been eating better and drinking well. She has a baseline anxiety vs bipolar history but appears better today. No other signs of alcohol withdrawal.  - continue to monitor for symptoms of alcohol withdrawal given her history and clinical presentation of ketoacidosis. Her history of poor nutrition is most likely the underlying cause for her acidosis. She has lost 25 lbs in the past 3 months and has markedly decreased nutritional intake.  - Appreciate nutrition recommendations. Start resource breeze 1 container 3 times daily between meals    2. Headache: On presentation she had some interesting neurologic manifestations that did not follow any vascular pattern to them. She had denied any history of trauma. Due to concern of temporal arteritis, she received 60 mg prednisone in the ED.  With an ESR of 10, temporal arteritis is unlikely. Negative CT clears concern for CVA. She has had some mild return of the headache the morning prior to discharge when she started to have her panic symptoms. Marked improvement in headache and muscle aches with Flexeril and sleep.  - will discharge on her previous home Flexeril for pain.   3. Chest pain: On admission she was complaining of epigastric pain that radiated up to her left chest. Pain was worse with vomiting Etiology most likely GI related. Patient has a significant history of GERD and has not been taking her home omeprazole as directed. Negative EKG,  CTA, CEx3, TSH. Could have also been secondary to panic disorder.   - Continue to monitor and treat her anxiety and GERD   4. Tobacco abuse: Patient says she is ready to quit. She has used the patch before but this was not helpful.   -Discussed using ecigarettes with the patient, which she seemed to intrigue her   - order smoking cessation counseling   5. Nausea/Vomiting: Appears to have cleared with the patient having tolerated all her meals since the morning after admission.   -Continue zofran 4 mg Q4H PRN   6. Collagenous colitis: per patient, this condition is stable. She has been prescribed 3 medications for this issue which she is not currently taking and is unsure of the names/doses/frequencies of these meds. She received 1 dose of metoclopramide in the ED for gut stimulation.   - Will need follow up with Dr. Elnoria Howard as an outpatient.    7. History of Bipolar disorder: Patients mood with worsening anxiety the morning prior to discharge but is better today.  She does not want to restart her medications without follow up at [facility]. We will give her a small dose of ativan for panic and continue her trazodone 100 mg PO QHS and have her follow up as an outpatient. She also has significant social stressors that may be driving the increased anxiety as well.   8. Dispo: The patient's symptoms  have markedly improved since admission. We will plan to D/C home today with follow up in the [facility].  [facility]rmand [facility] and needs a new [facility].   - appt with Dr. Everardo Beals on 07/26/12 at 11:15 AM to establish care and for hospital f/u  - will also need a follow up with Dr. Elnoria Howard and [name].    LOS: 3 days   Egan Berkheimer 07/23/2012, 2:10 PM

## 2012-07-23 NOTE — Progress Notes (Signed)
Patient discharged to home.  Discharge teaching completed including follow up care and medications.  Verbalizes no further questions.  Vital signs stable.

## 2012-07-24 LAB — LACTIC ACID, PLASMA: Lactic Acid, Venous: 0.9 mmol/L (ref 0.5–2.2)

## 2012-07-24 NOTE — Discharge Summary (Deleted)
Patient Name:  Monique Perry MRN: [id number]  PCP: Provider Default, MD DOB:  Oct 21, 1956       Date of Admission:  07/20/2012  Date of Discharge:  07/24/2012      Attending Physician: No att. providers found         DISCHARGE DIAGNOSES:    DISPOSITION AND FOLLOW-UP: Orie Cuttino is to follow-up with the listed providers as detailed below, at which time, the following should be addressed:   1. Follow-up visits: 1. Assess nutritional status, bowel movement history 2. Medication compliance 3. Schedule follow-up visit with Dr. Jeani Hawking 4. Headache frequency, neurologic deficits 5. Chest pain/GERD symptoms 6. Mood, assess for any manic episodes. Has there been follow-up scheduled 7. Smoking cessation  2. Labs / imaging needed: 1. F/u BMet  Follow-up Information    Follow up with Vernice Jefferson, MD on 07/26/2012. (at 11:15 AM)    Contact information:   [address] [place] [address] [phone]       Call [place]. (to make a follow up appointment)       Schedule an appointment as soon as possible for a visit with HUNG,PATRICK D, MD. (to follow up. )    Contact information:   [address], [address] [place] [address] [phone]         Discharge Orders    Future Appointments: Provider: Department: Dept Phone: Center:   07/26/2012 10:45 AM Dede Query, MD [facility] [phone] [facility]     Future Orders Please Complete By Expires   Diet general      Increase activity slowly      Discharge instructions      Comments:   1. Make sure you are eating at 3 meals per day.    2.  Continue the Breeze supplements between meals for now  3.  Continue your other medications as prescribed.  4.  Follow up with your new doctor at the Outpatient Clinic at [facility].  5.  Make sure you call Dr. Elnoria Howard for an appointment to follow up  6.  Make sure you follow up with [facility] for your mood and anxiety.        DISCHARGE MEDICATIONS: Medication List  As of 07/24/2012 12:30 PM   TAKE these medications         cyclobenzaprine 5 MG tablet   Commonly known as: FLEXERIL   Take 1 tablet twice daily with 2 tablets at bedtime as needed for muscle spasm      diclofenac sodium 1 % Gel   Commonly known as: VOLTAREN   Apply topically.      feeding supplement Liqd   Take 1 Container by mouth 3 (three) times daily between meals.      LORazepam 0.5 MG tablet   Commonly known as: ATIVAN   Take 1-2 tablets (0.5-1 mg total) by mouth 2 (two) times daily as needed.      nicotine 21 mg/24hr patch   Commonly known as: NICODERM CQ - dosed in mg/24 hours   Place 1 patch onto the skin daily.      pantoprazole 40 MG tablet   Commonly known as: PROTONIX   Take 1 tablet (40 mg total) by mouth daily at 12 noon.      traZODone 100 MG tablet   Commonly known as: DESYREL   Take 100 mg by mouth at bedtime.  CONSULTS:    PROCEDURES PERFORMED:  Dg Chest 2 View  07/20/2012  *RADIOLOGY REPORT*  Clinical Data: Chest pain.  Myalgias.  Smoker.  CHEST - 2 VIEW  Comparison: 08/28/2011.  Findings: The heart remains normal in size.  The lungs remain mildly hyperexpanded with mild diffuse peribronchial thickening and accentuation of the interstitial markings.  An increased depth of inspiration is demonstrated with associated less prominence of interstitial markings. There is a questionable nodular density in the left upper lung zone, overlying the anterior aspect of the left second rib, not previously seen.  Diffuse osteopenia.  IMPRESSION:  1.  Possible interval left upper lobe nodule or left anterior second rib fracture with nonunion.  Further evaluation with a chest CT, with contrast, is recommended. 2.  Mild changes of COPD and chronic bronchitis.  Original Report Authenticated By: Darrol Angel, M.D.   Ct Head Wo Contrast  07/20/2012  *RADIOLOGY REPORT*  Clinical Data:  headaches  CT HEAD WITHOUT  CONTRAST  Technique:  Contiguous axial images were obtained from the base of the skull through the vertex without contrast  Comparison:  None.  Findings:  The brain has a normal appearance without evidence for hemorrhage, acute infarction, hydrocephalus, or mass lesion.  There is no extra axial fluid collection.  The skull and paranasal sinuses are normal.  IMPRESSION: Normal CT of the head without contrast.  Original Report Authenticated By: Judie Petit. Ruel Favors, M.D.   Ct Angio Chest Pe W/cm &/or Wo Cm  07/20/2012  *RADIOLOGY REPORT*  Clinical Data: Left chest pain, elevated D-dimer  CT ANGIOGRAPHY CHEST  Technique:  Multidetector CT imaging of the chest using the standard protocol during bolus administration of intravenous contrast. Multiplanar reconstructed images including MIPs were obtained and reviewed to evaluate the vascular anatomy.  Contrast: OMNIPAQUE IOHEXOL 350 MG/ML SOLN  Comparison: 07/20/2012, 08/28/2011  Findings: Atherosclerosis of the thoracic aorta.  Negative for dissection or significant aneurysm.  Mild diffuse ectasia.  Major branch vessels including the brachiocephalic, left common carotid, left subclavian arteries are all patent.  Central pulmonary arteries are patent.  Negative for mediastinal hemorrhage or adenopathy.  Normal heart size.  No pericardial or pleural effusion.  Small hiatal hernia noted.  Included upper abdomen demonstrates no acute finding.  Lung windows demonstrate biapical pleural parenchymal scarring. Dependent basilar atelectasis.  Negative for pneumonia, acute airspace process, collapse, consolidation, edema, interstitial change, or central airway abnormality.  Anterior left healing 2nd rib fracture noted accounting for the chest x-ray abnormality.  No suspicious pulmonary nodule or mass.  IMPRESSION: Negative for thoracic aortic dissection.  No significant central acute pulmonary embolus  Biapical pleural scarring and bibasilar atelectasis.  No acute intrathoracic  process  Original Report Authenticated By: Judie Petit. Ruel Favors, M.D.       ADMISSION DATA: H&P: Patient is a 56 y.o. woman with PMH bipolar d/o, IBD (collagenous colitis), drug abuse, who presents with complaints of headache, chest pain, N/V since 07/15/2012. The first symptom she noticed was headache, which started 6 days prior to admission, is left-sided, above the left eye, throbbing and pressure-like in character, with pain that is constant. She has had blurry vision in the left eye, left eye weakness , dizziness, and generalized weakness associated with this HA since it began. She also admits to photophobia and phonophobia for the last 4-5 days. The photophobia is limited to the left eye. She has had associated blurry vision in this eye, but no identifiable vision loss.   The patient also  complains of chest pain which began one day after the headache, is stabbing/pressure/aching in character, 8/10 in severity, and is substernal in location. The pain has been constant during this time, and is mildly improved at rest (patient denies worsening with exertion). She did not take any medication to help alleviate the pain. The patient's chest pain radiates to the left neck, left arm, abdomen, and is associated with SOB worse then baseline (has SOB regularly which she attributes to her smoking habit).   On the day of admission, the patient began having nausea and vomiting, with 10 emetic episodes. She states that the emesis has been non-bloody and non-bilious, and is watery in appearance. Denies abdominal pain. States she has had significant diarrhea, but that this is normal for her given her diagnosis of microscopic colitis. Denies any bloody or black tarry bowel movements.   Physical Exam: Blood pressure 142/77, pulse 110, temperature 98.1 F (36.7 C), temperature source Oral, resp. rate 20, height 5\' 6"  (1.676 m), weight 142 lb (64.411 kg), SpO2 96.00%.  BP 130/75  Pulse 110  Temp 98.1 F (36.7 C) (Oral)   Resp 18  Ht 5\' 6"  (1.676 m)  Wt 142 lb (64.411 kg)  BMI 22.92 kg/m2  SpO2 96%  General Appearance:  Alert, cooperative, no distress, appears stated age   Head:  Normocephalic, without obvious abnormality, atraumatic   Eyes:  PERRL, conjunctiva/corneas clear, EOM's intact   Nose:  Nares normal, septum midline, mucosa normal, no drainage or sinus tenderness   Throat:  Lips, mucosa, and tongue normal; teeth and gums normal   Neck:  Supple, symmetrical, trachea midline, no adenopathy;  thyroid: no enlargement/tenderness/nodules;   Lungs:  Bibasilar rales, L>R; otherwise CTA   Chest Wall:  TTP left lower anterior chest wall   Heart:  Sinus tachycardia; S1 and S2 normal, no murmur, rub or gallop   Abdomen:  Soft, non-tender, bowel sounds active all four quadrants,  no masses, no organomegaly   Genitalia:  Normal female without lesion, discharge or tenderness   Extremities:  Extremities normal, atraumatic, no cyanosis or edema   Pulses:  2+ and symmetric all extremities   Skin:  Skin color, texture, turgor normal, no rashes or lesions   Lymph nodes:  Cervical, supraclavicular, and axillary nodes normal   Neurologic:  CNII-XII intact, 5/5 in RUE, RLE, LLE, 3/5 LUE; 2+ patellar and biceps DTR bilaterally, downgoing plantar reflex bilaterally; increased sensitivity to light touch on left face, most noticeable over temporal area. Decreased sensation to light touch on left arm.      Labs: Basic Metabolic Panel:   [facility]  07/20/12 1832   NA  133*   K  4.1   CL  90*   CO2  8*   GLUCOSE  49*   BUN  12   CREATININE  0.62   CALCIUM  9.2   MG  --   PHOS  --    CBC:   Basename  07/20/12 1832   WBC  15.1*   NEUTROABS  --   HGB  16.0*   HCT  46.1*   MCV  92.6   PLT  207    D-Dimer:   Basename  07/20/12 2017   DDIMER  2.67*    CBG:   Basename  07/20/12 2207   GLUCAP  54*    Urinalysis:   Basename  07/20/12 2302   COLORURINE  YELLOW   LABSPEC  1.025   PHURINE  5.5    GLUCOSEU  NEGATIVE   HGBUR  MODERATE*   BILIRUBINUR  NEGATIVE   KETONESUR  >80*   PROTEINUR  30*   UROBILINOGEN  0.2   NITRITE  NEGATIVE   LEUKOCYTESUR       HOSPITAL COURSE: 1. Metabolic acidosis, increased anion gap: Monique Perry initially presented with headache, chest pain, nausea, and vomiting for several days. Her anion gap on admission was 35, and improved to 4 on discharge with appropriate respiratory compensation. Her symptoms have improved markedly as well. Initial differential diagnosis was broad and included toxic ingestions, alcoholic ketoacidosis, starvation ketoacidosis, or D-lactic acidosis. She states that she has been extremely busy for the last week moving that she "kinda forgot to eat." She states that she usually only eats 1 meal per day and that she had not eaten for the last few days. She states that the last time she remembers for sure eating was Monday around noon. She stopped using alcohol after several life-threatening episodes last year, and says she is "addicted" to support groups. She states that she has been sober since February 29th 2012 and goes to 2 AA meetings per day. She appears reliable in this capacity. Serum ketones returned markedly positive which made D-lactic acidosis less likely. Gap closed on the Bmet on 8/9 in the afternoon so she was changed from D5 with 3 amps of Bicarb to NS at 75 ml/hr. On the day of discharge she was eating and drinking well. She has a baseline anxiety vs bipolar history but showed no acute signs of alcohol withdrawal. Her history of poor nutrition is most likely the underlying cause for her acidosis. She has lost 25 lbs in the past 3 months and has markedly decreased nutritional intake. Nutrition recommended to start resource breeze 1 container 3 times daily between meals. She was sent home with these nutritional recommendations. 2. Headache: On presentation she had neurologic manifestations that did not follow any vascular pattern to  them. She denied any history of trauma. Due to concern of temporal arteritis, she received 60 mg prednisone in the ED. With an ESR of 10, temporal arteritis is unlikely. Negative CT cleared concern for CVA. She has had some mild return of the headache the morning prior to discharge when she started to have her panic symptoms. She had marked improvement in headache and muscle aches with Flexeril and sleep. She was discharged on her previous home Flexeril for pain.  3. Chest pain: On admission she was complaining of epigastric pain that radiated up to her left chest. Pain was worse with vomiting. Etiology most likely GI related. Patient has a significant history of GERD and has not been taking her home omeprazole as directed. Negative EKG, CTA, CEx3, TSH. Could have also been secondary to panic disorder. She was started on Protonix 40 mg PO QDay while in the hospital and discharged with a home Protonix prescription. 4. Tobacco abuse: Patient says she is ready to quit. She has used the patch before but this was not helpful. The team discussed using ecigarettes with the patient, which she seemed to intrigue her. Smoking cessation counseling was performed during the hospitalization. She was discharged with a nicotine patch 5. Nausea/Vomiting: Appears to have cleared with the patient having tolerated all her meals since the morning after admission. 6. Collagenous colitis: per patient, this condition is stable. She has been prescribed 3 medications for this issue which she is not currently taking and is unsure of the names/doses/frequencies of these meds. She received 1 dose of  metoclopramide in the ED for gut stimulation. She will need follow up with Dr. Elnoria Howard as an outpatient.  7. History of Bipolar disorder: Patients mood with worsening anxiety the morning prior to discharge but is better today. She does not want to restart her medications without follow up at [facility] We will give her a small dose of ativan for  panic and continue her trazodone 100 mg PO QHS and have her follow up as an outpatient. She also has significant social stressors that may be driving the increased anxiety as well.   DISCHARGE DATA: Vital Signs: BP 106/55  Pulse 91  Temp 98 F (36.7 C) (Oral)  Resp 20  Ht 5\' 6"  (1.676 m)  Wt 65.772 kg (145 lb)  BMI 23.40 kg/m2  SpO2 95%  Labs: CBC:   Lab  07/23/12 0553  07/22/12 0545   WBC  9.4  11.1*   NEUTROABS  5.1  6.0   HGB  12.5  12.8   HCT  36.9  36.5   MCV  90.9  88.2   PLT  174  199    Basic Metabolic Panel:   Lab  07/23/12 0553  07/22/12 1133  07/21/12 0153  07/21/12 0114   NA  138  141  --  --   K  4.0  3.2*  --  --   CL  105  100  --  --   CO2  29  32  --  --   GLUCOSE  103*  114*  --  --   BUN  8  7  --  --   CREATININE  0.53  0.56  --  --   CALCIUM  8.9  8.8  --  --   MG  --  --  --  1.5   PHOS  --  --  2.2*  --     Time spent on discharge: 1 hr   Signed by:   07/24/2012, 4:40 PM

## 2012-07-26 ENCOUNTER — Ambulatory Visit (INDEPENDENT_AMBULATORY_CARE_PROVIDER_SITE_OTHER): Payer: Medicaid Other | Admitting: Internal Medicine

## 2012-07-26 ENCOUNTER — Ambulatory Visit: Payer: Medicaid Other | Admitting: Internal Medicine

## 2012-07-26 ENCOUNTER — Encounter: Payer: Medicaid Other | Admitting: Internal Medicine

## 2012-07-26 ENCOUNTER — Encounter: Payer: Self-pay | Admitting: Internal Medicine

## 2012-07-26 VITALS — BP 140/70 | HR 106 | Temp 99.2°F | Ht 66.0 in | Wt 147.2 lb

## 2012-07-26 DIAGNOSIS — F319 Bipolar disorder, unspecified: Secondary | ICD-10-CM

## 2012-07-26 DIAGNOSIS — K5289 Other specified noninfective gastroenteritis and colitis: Secondary | ICD-10-CM

## 2012-07-26 DIAGNOSIS — R51 Headache: Secondary | ICD-10-CM

## 2012-07-26 DIAGNOSIS — K219 Gastro-esophageal reflux disease without esophagitis: Secondary | ICD-10-CM

## 2012-07-26 DIAGNOSIS — Z91199 Patient's noncompliance with other medical treatment and regimen due to unspecified reason: Secondary | ICD-10-CM | POA: Insufficient documentation

## 2012-07-26 DIAGNOSIS — Z9119 Patient's noncompliance with other medical treatment and regimen: Secondary | ICD-10-CM | POA: Insufficient documentation

## 2012-07-26 DIAGNOSIS — F1010 Alcohol abuse, uncomplicated: Secondary | ICD-10-CM

## 2012-07-26 DIAGNOSIS — F1910 Other psychoactive substance abuse, uncomplicated: Secondary | ICD-10-CM

## 2012-07-26 DIAGNOSIS — K52831 Collagenous colitis: Secondary | ICD-10-CM

## 2012-07-26 DIAGNOSIS — E872 Acidosis: Secondary | ICD-10-CM

## 2012-07-26 NOTE — Patient Instructions (Addendum)
1. Need [facility] follow up on medical compliance.  2. Need to call Dr. Elnoria Howard for follow up appt next week. 3. You need to fill your discharge medicines TODAY 4. Stop smoking

## 2012-07-26 NOTE — Progress Notes (Signed)
Patient ID: Monique Perry, female   DOB: 27-Oct-1956, 56 y.o.   MRN: [id number]  Subjective:   Patient ID: Monique Perry female   DOB: Apr 20, 1956 56 y.o.   MRN: [id number]  HPI: [name] is a 56 y.o. woman with PMH of polysubstance abuse, sleep depravation, collagenous colitis followed by GI Dr. Elnoria Howard, who presents to the clinic at the clinic.  She was admitted to the hospital during 8/8- /11/13 for AG gap metobolic acidosis associated with starvation Ketoacidosis, chest pain and headache. Poor nutrition intake was thought to be the cause. She has lost 25 lbs in last 3 months. She was sent home with breeze nutritional drinks. She has not pick up any of her discharge medications.   She has had negative cardiac workup for her chest pain. The etiology was thought to be related to her GERD and medical noncompliance with PPI. She has followed with Dr. Elnoria Howard for the management of Collagenous colitis and told me that she had negative EGD and colonoscopy.  Her headache was evaluated due to focal neurological changes including decreased sensation on her left temple area and blurring O.S. And left arm weakness. She is ruled out temporal arteritis. Negative CT for CVA. Her headache was treated with Flexeril.  She states that she is doing ok. Her main concern is decreased oral intake. She reports that she does not feel hungry and will not eat food. She tries 6 small meal plan now. She also admits heavy alcohol abuse and illicit drug in the past. Notably, her alcohol abuse was very significant that she reports going through detox in 2011. She sees a psychiatrist for ETOH and bipolar management. She is weaned off her medications for Bipolar disorder by her psychiatrist.   She reports that she is depressed, but she does not have SI/HI. She has an follow up appt on 08/07/12. She understand that she needs to call 911 if she experience SI/HI or manic episodes.       Past Medical History  Diagnosis Date  . Alcohol  abuse last February 2012  . Drug abuse last 1990's  . Arthritis   . Collagenous colitis   . Bipolar disorder    Current Outpatient Prescriptions  Medication Sig Dispense Refill  . cyclobenzaprine (FLEXERIL) 5 MG tablet Take 1 tablet twice daily with 2 tablets at bedtime as needed for muscle spasm  120 tablet  0  . diclofenac sodium (VOLTAREN) 1 % GEL Apply topically.      . feeding supplement (RESOURCE BREEZE) LIQD Take 1 Container by mouth 3 (three) times daily between meals.  90 Container  1  . LORazepam (ATIVAN) 0.5 MG tablet Take 1-2 tablets (0.5-1 mg total) by mouth 2 (two) times daily as needed.  30 tablet  0  . nicotine (NICODERM CQ - DOSED IN MG/24 HOURS) 21 mg/24hr patch Place 1 patch onto the skin daily.  28 patch  0  . pantoprazole (PROTONIX) 40 MG tablet Take 1 tablet (40 mg total) by mouth daily at 12 noon.  30 tablet  0  . traZODone (DESYREL) 100 MG tablet Take 100 mg by mouth at bedtime.       Family History  Problem Relation Age of Onset  . Diabetes Father   . Hypertension Father   . Hyperlipidemia Father   . Stroke Mother    History   Social History  . Marital Status: Single    Spouse Name: N/A    Number of Children: N/A  . Years of  Education: N/A   Social History Main Topics  . Smoking status: Current Everyday Smoker -- 0.5 packs/day    Types: Cigars  . Smokeless tobacco: None   Comment: smoking cig for 35 years. continues to smoke 1/2 pack daily  . Alcohol Use: Yes     ETOH abuse for 25 year, 1/2 gallon Liquor daily. admits 6 weeks course of drinking ETOH as her only intake in 2011. she states that she quits her drinnking since Feb 2012.  . Drug Use: No     pots before, clean since 1995  . Sexually Active: None   Other Topics Concern  . None   Social History Narrative   She lives with his stepson in [place]. Moved from [place] 2 years ago. Both  Parents lives in [place]. 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 Currently working at the [employer] (  [employer]). Did not complete High School . She admits only 2-3 hours daily for 15 years. She does not sleep at nighttime. She would watch TV and read at night time. Always sleep during the day. She has seen a psychiatrist at [facility] and still goes to the group therapy on Mondays. She has an follow up appt with her Psychiatrist on 08/07/12.   Review of Systems: Review of Systems:  Constitutional:  Denies fever, chills, diaphoresis, decreased appetite change and fatigue.   HEENT:  Denies congestion, sore throat, rhinorrhea, sneezing, mouth sores, trouble swallowing, neck pain   Respiratory:  Denies SOB, DOE, cough, and wheezing.   Cardiovascular:  Denies palpitations and leg swelling.   Gastrointestinal:  Denies nausea, vomiting, abdominal pain, diarrhea, constipation, blood in stool and abdominal distention.   Genitourinary:  Denies dysuria, urgency, frequency, hematuria, flank pain and difficulty urinating.   Musculoskeletal:  Denies myalgias, back pain, joint swelling, arthralgias and gait problem.   Skin:  Denies pallor, rash and wound.   Neurological:  Denies dizziness, seizures, syncope, weakness, light-headedness, numbness and positive for headaches.    .   Objective:  Physical Exam: Filed Vitals:   07/26/12 1452  BP: 140/70  Pulse: 106  Temp: 99.2 F (37.3 C)  TempSrc: Oral  Height: 5\' 6"  (1.676 m)  Weight: 147 lb 3.2 oz (66.769 kg)  SpO2: 95%   General: alert, well-developed, and cooperative to examination.  Head: normocephalic and atraumatic.  Eyes: vision grossly intact, pupils equal, pupils round, pupils reactive to light, no injection and anicteric.  Mouth: pharynx pink and moist, no erythema, and no exudates.  Neck: supple, full ROM, no thyromegaly, no JVD, and no carotid bruits.  Lungs: normal respiratory effort, no accessory muscle use, normal breath sounds, no crackles, and no wheezes. Heart: normal rate, regular rhythm, no murmur, no gallop, and no rub.    Abdomen: soft, non-tender, normal bowel sounds, no distention, no guarding, no rebound tenderness, no hepatomegaly, and no splenomegaly.  Msk: no joint swelling, no joint warmth, and no redness over joints.  Pulses: 2+ DP/PT pulses bilaterally Extremities: No cyanosis, clubbing, edema Neurologic: alert & oriented X3, cranial nerves II-XII intact, strength normal in all extremities, sensation intact to light touch except for decreased sensation on left temple area, and gait normal.  Skin: turgor normal and no rashes.  Psych: Oriented X3, memory intact for recent and remote, normally interactive, good eye contact, not anxious appearing, and not depressed appearing.   Assessment & Plan:

## 2012-07-27 ENCOUNTER — Telehealth: Payer: Self-pay | Admitting: Licensed Clinical Social Worker

## 2012-07-27 DIAGNOSIS — K52831 Collagenous colitis: Secondary | ICD-10-CM | POA: Insufficient documentation

## 2012-07-27 DIAGNOSIS — F319 Bipolar disorder, unspecified: Secondary | ICD-10-CM | POA: Insufficient documentation

## 2012-07-27 DIAGNOSIS — F1010 Alcohol abuse, uncomplicated: Secondary | ICD-10-CM | POA: Insufficient documentation

## 2012-07-27 DIAGNOSIS — F1910 Other psychoactive substance abuse, uncomplicated: Secondary | ICD-10-CM | POA: Insufficient documentation

## 2012-07-27 LAB — BASIC METABOLIC PANEL WITH GFR
BUN: 18 mg/dL (ref 6–23)
Calcium: 9.2 mg/dL (ref 8.4–10.5)
Creat: 0.69 mg/dL (ref 0.50–1.10)
GFR, Est African American: >89 mL/min
GFR, Est Non African American: >89 mL/min

## 2012-07-27 NOTE — Assessment & Plan Note (Signed)
Negative EGD and Colonoscopy by Dr. Elnoria Howard per patient reports  - instructed her to pick up her PPI today.

## 2012-07-27 NOTE — Assessment & Plan Note (Signed)
She sees a psychiatrist for ETOH and bipolar management. She is weaned off her medications for Bipolar disorder by her psychiatrist per her report.  She reports that she is depressed, but she does not have SI/HI. She has an follow up appt on 08/07/12. She understand that she needs to call 911 if she experience SI/HI or manic episodes.

## 2012-07-27 NOTE — Assessment & Plan Note (Addendum)
Asymptomatic now. Followed by Dr. Elnoria Howard in the past.  Patient is instructed to call Dr. Elnoria Howard office for an f/u appt next week.

## 2012-07-27 NOTE — Telephone Encounter (Signed)
Ms. Pujol was referred to CSW for med non-compliance, smoking cessation, and personal stressors.  CSW placed call to Ms. Burbach.  CSW left message requesting return call. CSW provided contact hours and phone number.

## 2012-07-27 NOTE — Assessment & Plan Note (Signed)
Denies N/V/D or abdominal pain. States that she still has a hard time to remember to eat because " I do not feel hungry."   - BMP repeated at the clinic>>resolved - instruct her to pick up her nutritional drink and follow her diet instruction.

## 2012-07-27 NOTE — Assessment & Plan Note (Signed)
Her headache was evaluated due to focal neurological changes including decreased sensation on her left temple area and blurring O.S. And left arm weakness. She is ruled out temporal arteritis. Negative CT for CVA. Her headache was treated with Flexeril with relief.   Her neuro exam is unremarkable except for decreased sensation on her left temple area at the clinic today. No other focal neuro deficit.  - will ask her to continue her flexeril.

## 2012-07-31 NOTE — Telephone Encounter (Signed)
CSW placed call to Ms. Monique Perry.  Discussed possible referrals that may be beneficial.  Pt states she is picking up her NRT - patches today to assist with smoking cessation.  Ms. Monique Perry is linked with [name], pt reports she attends a Dual Diagnosis Group at [name] and will be starting back with individual therapy and psychiatry through [name].  Ms. Monique Perry attends AA mtgs weekly, approx 10-20 meetings a week, "that's my social life, it keeps me out of trouble."  Pt in agreement to sign ROI.  CSW will hold on refer to [facility] as pt reports she is following through with mental health services.  CSW will mail letter/ROI for pt to sign and return.

## 2012-08-10 ENCOUNTER — Other Ambulatory Visit: Payer: Self-pay | Admitting: *Deleted

## 2012-08-10 ENCOUNTER — Encounter: Payer: Self-pay | Admitting: Licensed Clinical Social Worker

## 2012-08-15 NOTE — Telephone Encounter (Signed)
Patient requests to refill her Ativan. Per her discharge summary in August 2013, she was discharged with small dose Ativan for short course for her anxiety pending outpatient psychiatric follow up (Bipolar). I will not approve her request for refill for now.   1. Please help her to arrange outpatient Psychiatrist follow up. She can go to walk in clinic at [facility] 2. Meanwhile, She can also be seen in the clinic to re evaluate her anxiety while waiting for Psychiatric appt.   Thank you

## 2012-08-31 ENCOUNTER — Ambulatory Visit (INDEPENDENT_AMBULATORY_CARE_PROVIDER_SITE_OTHER): Payer: Medicaid Other | Admitting: Internal Medicine

## 2012-08-31 ENCOUNTER — Encounter: Payer: Self-pay | Admitting: Internal Medicine

## 2012-08-31 VITALS — BP 133/80 | HR 88 | Temp 98.3°F | Ht 66.0 in | Wt 146.6 lb

## 2012-08-31 DIAGNOSIS — F32A Depression, unspecified: Secondary | ICD-10-CM

## 2012-08-31 DIAGNOSIS — Z720 Tobacco use: Secondary | ICD-10-CM

## 2012-08-31 DIAGNOSIS — F329 Major depressive disorder, single episode, unspecified: Secondary | ICD-10-CM

## 2012-08-31 DIAGNOSIS — L989 Disorder of the skin and subcutaneous tissue, unspecified: Secondary | ICD-10-CM | POA: Insufficient documentation

## 2012-08-31 DIAGNOSIS — Z23 Encounter for immunization: Secondary | ICD-10-CM

## 2012-08-31 DIAGNOSIS — E785 Hyperlipidemia, unspecified: Secondary | ICD-10-CM

## 2012-08-31 DIAGNOSIS — F17200 Nicotine dependence, unspecified, uncomplicated: Secondary | ICD-10-CM

## 2012-08-31 DIAGNOSIS — R638 Other symptoms and signs concerning food and fluid intake: Secondary | ICD-10-CM

## 2012-08-31 MED ORDER — CYCLOBENZAPRINE HCL 5 MG PO TABS: ORAL_TABLET | ORAL | Status: DC

## 2012-08-31 NOTE — Assessment & Plan Note (Addendum)
She follows up with her Psychiatrist regularly ans was just put on Remeron.  - Continue management by Psych. - re regarding her stressors, I have requested SW to get involved -she should continue her weekly classes as well.

## 2012-08-31 NOTE — Progress Notes (Signed)
Patient ID: Monique Perry, female   DOB: 21-Mar-1956, 56 y.o.   MRN: [id number]  Subjective:   Patient ID: Monique Perry female   DOB: 25-Oct-1956 56 y.o.   MRN: [id number]  HPI: [name] is a 56 y.o. woman with PMH of polysubstance abuse, sleep depravation, collagenous colitis followed by GI Dr. Elnoria Howard, who presents to the clinic for follow up visit.  She has followed with Dr. Elnoria Howard for the management of Collagenous colitis and told me that she had negative EGD and colonoscopy.  She was admitted to the hospital during 8/8- /11/13 for AG gap metobolic acidosis associated with starvation Ketoacidosis, chest pain and headache. Poor nutrition intake was thought to be the cause. She has lost 25 lbs in last 3 months. She was sent home with breeze nutritional drinks.   1. Anxiety/Depression     Patient reports a lot of stressors from her life and she follows up with Her Psychiatrist at [facility]. She goes to the class for substance abuse and mental disorders on weekly basis. She also sees her Psychiatrist monthly and her next appt is on 09/11/12. She was given Remeron 15 mg po daily.   She reports that she is not depressed, and does not have SI/HI.  She understand that she needs to call 911 if she experience SI/HI or manic episodes.    2. substance abuse    History of substances abuse including ETOH, Marijuana, cocaine and controlled substances. She stopped all drug abuse 10 years.   She also admits heavy alcohol abuse and illicit drug in the past. Notably, her alcohol abuse was very significant that she reports going through detox in 2011. She sees a psychiatrist for ETOH and bipolar management. She is weaned off her medications for Bipolar disorder by her psychiatrist.   3. Tobacco abuse     She does not want to stop smoking now. She is offered free patch and does not want to use it now.   4. Left nose skin lesion Patient reports left nose skin lesion for 7-8 month. No pain or discomfort. No ulcer noted.      Health maintenance 1. Flu shot>> today 2. Colonoscopy>>> in 2012 by Dr. Elnoria Howard 3. Pap smear>> overdue. Refused now.  4. Tetanus shot>>> in 2011   Past Medical History  Diagnosis Date  . Alcohol abuse last February 2012  . Drug abuse last 1990's  . Arthritis   . Collagenous colitis   . Bipolar disorder    Current Outpatient Prescriptions  Medication Sig Dispense Refill  . cyclobenzaprine (FLEXERIL) 5 MG tablet Take 1 tablet twice daily with 2 tablets at bedtime as needed for muscle spasm  120 tablet  0  . diclofenac sodium (VOLTAREN) 1 % GEL Apply topically.      . pantoprazole (PROTONIX) 40 MG tablet Take 1 tablet (40 mg total) by mouth daily at 12 noon.  30 tablet  0  . traZODone (DESYREL) 100 MG tablet Take 100 mg by mouth at bedtime.      . feeding supplement (RESOURCE BREEZE) LIQD Take 1 Container by mouth 3 (three) times daily between meals.  90 Container  1   Family History  Problem Relation Age of Onset  . Diabetes Father   . Hypertension Father   . Hyperlipidemia Father   . Stroke Mother    History   Social History  . Marital Status: Single    Spouse Name: N/A    Number of Children: N/A  . Years of Education:  N/A   Social History Main Topics  . Smoking status: Current Every Day Smoker -- 0.5 packs/day    Types: Cigars  . Smokeless tobacco: None   Comment: smoking cig for 35 years. continues to smoke 1/2 pack daily  . Alcohol Use: Yes     ETOH abuse for 25 year, 1/2 gallon Liquor daily. admits 6 weeks course of drinking ETOH as her only intake in 2011. she states that she quits her drinnking since Feb 2012.  . Drug Use: No     pots before, clean since 1995  . Sexually Active: None   Other Topics Concern  . None   Social History Narrative   She lives with his stepson in [place]. Moved from [place] 2 years ago. Both  Parents lives in [place]. 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 Did not  complete High School . She admits only 2-3 hours daily for 15 years. She does not sleep at nighttime. She would watch TV and read at night time. Always sleep during the day. She has seen a psychiatrist at [facility] and still goes to the group therapy on Mondays. She has an follow up appt with her Psychiatrist on 08/07/12.   Review of Systems: Review of Systems:  Constitutional:  Denies fever, chills, diaphoresis  HEENT:  Denies congestion, sore throat, rhinorrhea, sneezing, mouth sores, trouble swallowing, neck pain   Respiratory:  Denies SOB, DOE, cough, and wheezing.   Cardiovascular:  Denies palpitations and leg swelling.   Gastrointestinal:  Denies nausea, vomiting, abdominal pain, diarrhea, constipation, blood in stool and abdominal distention.   Genitourinary:  Denies dysuria, urgency, frequency, hematuria, flank pain and difficulty urinating.   Musculoskeletal:  Denies myalgias, back pain, joint swelling, arthralgias and gait problem.   Skin:  Denies pallor, rash and wound.   Neurological:  Denies dizziness, seizures, syncope, weakness, light-headedness, numbness .    Marland Kitchen   Objective:  Physical Exam: Filed Vitals:   08/31/12 1404  BP: 133/80  Pulse: 88  Temp: 98.3 F (36.8 C)  TempSrc: Oral  Height: 5\' 6"  (1.676 m)  Weight: 146 lb 9.6 oz (66.497 kg)  SpO2: 97%   General: alert, well-developed, and cooperative to examination.  Head: normocephalic and atraumatic.  Eyes: vision grossly intact, pupils equal, pupils round, pupils reactive to light, no injection and anicteric.  Mouth: pharynx pink and moist, no erythema, and no exudates. Nose 3 mm pink skin lesion noted on left side of her nose tip.  No ulcer noted Neck: supple, full ROM, no thyromegaly, no JVD, and no carotid bruits.  Lungs: normal respiratory effort, no accessory muscle use, normal breath sounds, no crackles, and no wheezes. Heart: normal rate, regular rhythm, no murmur, no gallop, and no rub.  Abdomen: soft,  non-tender, normal bowel sounds, no distention, no guarding, no rebound tenderness, no hepatomegaly, and no splenomegaly.  Msk: no joint swelling, no joint warmth, and no redness over joints.  Pulses: 2+ DP/PT pulses bilaterally Extremities: No cyanosis, clubbing, edema Neurologic: alert & oriented X3, cranial nerves II-XII intact, strength normal in all extremities, sensation intact to light touch  Skin: turgor normal and no rashes.  Psych: Oriented X3, memory intact for recent and remote, normally interactive, good eye contact, not anxious appearing, and not depressed appearing.   Assessment & Plan:

## 2012-08-31 NOTE — Patient Instructions (Addendum)
1. Will send you for dietitian consult. Please set up appt with [facility] 2. Please f/u with your psychiatrist 3. We will discuss about Smoking cessation next visit.

## 2012-08-31 NOTE — Assessment & Plan Note (Signed)
This is ongoing chronic issue. Her BMI is 23 She states that she can not afford the nutrition drinks that she was prescibed.  - will consult nutritionist.

## 2012-08-31 NOTE — Assessment & Plan Note (Addendum)
She does not want to stop smoking. Risks of Tobacco abuse discussed with patient. She states that she has been working hard to stay away from illicit drugs/ETOH abuse. And it is very hard for her to quit smoking.    - will continue to work on this issue.

## 2012-09-01 LAB — LIPID PANEL: Cholesterol: 232 mg/dL — ABNORMAL HIGH (ref 0–200)

## 2012-09-05 ENCOUNTER — Encounter: Payer: Medicaid Other | Admitting: Dietician

## 2012-09-06 NOTE — Assessment & Plan Note (Signed)
Patient reports small skin lesion on her left sided nose.   It is noted to be slightly red colored, slight raised, 2mm lesion. No skin ulcer noted. No LAD.  - will send dermatology referral

## 2012-09-06 NOTE — Assessment & Plan Note (Signed)
High TG noted and will repeat her Lipid panel and direct LDL.

## 2012-09-11 ENCOUNTER — Other Ambulatory Visit (HOSPITAL_COMMUNITY): Payer: Self-pay | Admitting: Internal Medicine

## 2012-09-12 ENCOUNTER — Ambulatory Visit: Payer: Medicaid Other | Admitting: Dietician

## 2012-10-02 ENCOUNTER — Ambulatory Visit (INDEPENDENT_AMBULATORY_CARE_PROVIDER_SITE_OTHER): Payer: Medicaid Other | Admitting: Dietician

## 2012-10-02 ENCOUNTER — Encounter: Payer: Self-pay | Admitting: Internal Medicine

## 2012-10-02 ENCOUNTER — Ambulatory Visit (INDEPENDENT_AMBULATORY_CARE_PROVIDER_SITE_OTHER): Payer: Medicaid Other | Admitting: Internal Medicine

## 2012-10-02 VITALS — Ht 66.0 in | Wt 142.2 lb

## 2012-10-02 VITALS — BP 140/92 | HR 94 | Temp 98.3°F | Ht 66.0 in | Wt 142.2 lb

## 2012-10-02 DIAGNOSIS — R634 Abnormal weight loss: Secondary | ICD-10-CM | POA: Insufficient documentation

## 2012-10-02 DIAGNOSIS — M899 Disorder of bone, unspecified: Secondary | ICD-10-CM

## 2012-10-02 DIAGNOSIS — Z1231 Encounter for screening mammogram for malignant neoplasm of breast: Secondary | ICD-10-CM

## 2012-10-02 DIAGNOSIS — Z720 Tobacco use: Secondary | ICD-10-CM

## 2012-10-02 DIAGNOSIS — M8580 Other specified disorders of bone density and structure, unspecified site: Secondary | ICD-10-CM | POA: Insufficient documentation

## 2012-10-02 DIAGNOSIS — F17200 Nicotine dependence, unspecified, uncomplicated: Secondary | ICD-10-CM

## 2012-10-02 DIAGNOSIS — Z1239 Encounter for other screening for malignant neoplasm of breast: Secondary | ICD-10-CM

## 2012-10-02 DIAGNOSIS — R638 Other symptoms and signs concerning food and fluid intake: Secondary | ICD-10-CM

## 2012-10-02 MED ORDER — CALCIUM CITRATE-VITAMIN D 315-200 MG-UNIT PO TABS: 2.0000 | ORAL_TABLET | Freq: Two times a day (BID) | ORAL | Status: DC

## 2012-10-02 MED ORDER — MEGESTROL ACETATE 400 MG/10ML PO SUSP: 800.0000 mg | Freq: Every day | ORAL | Status: DC

## 2012-10-02 NOTE — Assessment & Plan Note (Signed)
Start calcium-vit D supplementation

## 2012-10-02 NOTE — Progress Notes (Signed)
HPI The patient is a 56 y.o. yo female with a history of   At the patient's last visit, she was noted to have malnutrition.  Since then, she has still lost 4 pounds.  She notes that she "forgets" to eat.  She is unable to afford resourse breeze.  The patient follows with [facility].  Her last visit was last Monday.  A nose lesion was noted at her last visit.  The patient was referred to Dermatology.  ROS: General: no fevers, chills, changes in weight, changes in appetite Skin: no rash HEENT: no blurry vision, hearing changes, sore throat Pulm: no dyspnea, coughing, wheezing CV: no chest pain, palpitations, shortness of breath Abd: no abdominal pain, nausea/vomiting, diarrhea/constipation GU: no dysuria, hematuria, polyuria Ext: no arthralgias, myalgias Neuro: no weakness, numbness, or tingling  Filed Vitals:   10/02/12 1431  BP: 140/92  Pulse: 94  Temp: 98.3 F (36.8 C)    PEX General: alert, cooperative, and in no apparent distress HEENT: pupils equal round and reactive to light, vision grossly intact, oropharynx clear and non-erythematous  Neck: supple, no lymphadenopathy, JVD, or carotid bruits Lungs: clear to ascultation bilaterally, normal work of respiration, no wheezes, rales, ronchi Heart: regular rate and rhythm, no murmurs, gallops, or rubs Abdomen: soft, non-tender, non-distended, normal bowel sounds Msk: no joint edema, warmth, or erythema Extremities: 2+ DP/PT pulses bilaterally, no cyanosis, clubbing, or edema Neurologic: alert & oriented X3, cranial nerves II-XII intact, strength grossly intact, sensation intact to light touch  Current Outpatient Prescriptions on File Prior to Visit  Medication Sig Dispense Refill  . calcium citrate-vitamin D (CITRACAL+D) 315-200 MG-UNIT per tablet Take 2 tablets by mouth 2 (two) times daily.  120 tablet  3  . cyclobenzaprine (FLEXERIL) 5 MG tablet Take 1 tablet twice daily with 2 tablets at bedtime as needed for muscle spasm  120  tablet  0  . cyclobenzaprine (FLEXERIL) 5 MG tablet TAKE 1 TABLET TWICE DAILY WITH 2 TABLETS AT BEDTIME AS NEEDED FOR MUSCLE SPASM  120 tablet  0  . diclofenac sodium (VOLTAREN) 1 % GEL Apply topically.      . feeding supplement (RESOURCE BREEZE) LIQD Take 1 Container by mouth 3 (three) times daily between meals.  90 Container  1  . mirtazapine (REMERON) 15 MG tablet Take 15 mg by mouth at bedtime.      . pantoprazole (PROTONIX) 40 MG tablet Take 1 tablet (40 mg total) by mouth daily at 12 noon.  30 tablet  0  . traZODone (DESYREL) 100 MG tablet Take 100 mg by mouth at bedtime.        Assessment/Plan

## 2012-10-02 NOTE — Progress Notes (Signed)
HPI The patient is a 56 y.o. yo female with a history of Bipolar disorder (managed at [facility]), hyperlipidemia, presenting for a 30-month follow-up.  The patient was last seen 1 month ago after a hospitalization for starvation ketoacidosis.  Since her last visit, the patient has continued to lose about 4 pounds.  She notes that she has "no appetite", stating that she feels like her stress/anxiety level is so high that it prevents her from remembering to eat.  She notes no nausea, vomiting, or odynophagia.  The patient notes a sensation of something stuck in her throat when she is at her most anxious, thought to be Globus, but no dysphagia at baseline.  The patient was unable to afford [employer], prescribed at her last appointment.  The patient met with our Nutritionist, Lupita Leash Plyler today.  The patient continues to smoke tobacco.  She notes that she is not yet ready to quit, stating that smoking helps her deal with her life stress.  The patient has a history of bipolar disorder, managed by weekly meetings at [facility], and monthly visits with a psychiatrist.  ROS: General: no fevers, chills, changes in weight, changes in appetite Skin: no rash HEENT: no blurry vision, hearing changes, sore throat Pulm: no dyspnea, coughing, wheezing CV: no chest pain, palpitations, shortness of breath Abd: no abdominal pain, nausea/vomiting, diarrhea/constipation GU: no dysuria, hematuria, polyuria Ext: no arthralgias, myalgias Neuro: no weakness, numbness, or tingling  Filed Vitals:   10/02/12 1431  BP: 140/92  Pulse: 94  Temp: 98.3 F (36.8 C)    PEX General: alert, cooperative, and in no apparent distress HEENT: pupils equal round and reactive to light, vision grossly intact, oropharynx clear and non-erythematous  Neck: supple, no lymphadenopathy Lungs: clear to ascultation bilaterally, normal work of respiration, no wheezes, rales, ronchi Heart: regular rate and rhythm, no murmurs, gallops, or  rubs Abdomen: soft, non-tender, non-distended, normal bowel sounds Extremities: no cyanosis, clubbing, or edema Neurologic: alert & oriented X3, cranial nerves II-XII intact, strength grossly intact, sensation intact to light touch  Current Outpatient Prescriptions on File Prior to Visit  Medication Sig Dispense Refill  . calcium citrate-vitamin D (CITRACAL+D) 315-200 MG-UNIT per tablet Take 2 tablets by mouth 2 (two) times daily.  120 tablet  3  . cyclobenzaprine (FLEXERIL) 5 MG tablet TAKE 1 TABLET TWICE DAILY WITH 2 TABLETS AT BEDTIME AS NEEDED FOR MUSCLE SPASM  120 tablet  0  . diclofenac sodium (VOLTAREN) 1 % GEL Apply topically.      . mirtazapine (REMERON) 15 MG tablet Take 15 mg by mouth at bedtime.      . pantoprazole (PROTONIX) 40 MG tablet Take 1 tablet (40 mg total) by mouth daily at 12 noon.  30 tablet  0  . traZODone (DESYREL) 100 MG tablet Take 100 mg by mouth at bedtime.        Assessment/Plan

## 2012-10-02 NOTE — Assessment & Plan Note (Signed)
The patient continues to smoke tobacco, and is not yet ready to quit. -continue to encourage patient to quit smoking

## 2012-10-02 NOTE — Progress Notes (Signed)
Medical Nutrition Therapy:  Appt start time: 1330 end time:  1415.  Assessment:  Primary concerns today: Inadequate food intake.  Patient concerned about not getting hungry, she reports 1/2 pack cigarettes/day and increased stress. Bowels move once daily and normal consistency. Lowest weight 100# highest adult weight 176# Her desired weight is 130-135# which is appropriate  Usual eating pattern includes 1 meals and 2 snacks per day.Estimated intake ~ 1000 calories, 50-60 grams protein/day estimated needs 1500-1800 calories/day Usual physical activity includes little other than ADLS.  Everyday foods include skim milk, tea with saccharin, water and potatoes.  Avoided foods include corn, tomatoes,acidic foods such as orange juice, oranges, grapefruit.     Progress Towards Goal(s):  In progress.   Nutritional Diagnosis:   NI-1.4 Inadequate energy intake As related to decreased intake.  As evidenced by her 24 hours recall and report.    Intervention:  Nutrition education about needs for nutrients, exercise and balanced meals..  Monitoring/Evaluation:  Dietary intake, exercise, and body weight prn.

## 2012-10-02 NOTE — Patient Instructions (Addendum)
Vitamin C- 4 oz cranberry juice each day  Hunger- suggest scheduled times to eat: 1, 5 9 12  am               A meal can consist of yogurt with or without grapenuts or nay type of cereal.              - suggest a 30 minute walk each day  Vitamin Bs- please try to eat at least one servings of starch up to 4 servings each day   If your weight does not stabilize between 130 and 143# please reschedule another visit  Lupita Leash [phone]

## 2012-10-02 NOTE — Patient Instructions (Addendum)
For your bone health, we are starting Calcium and Vitamin D.  Take 2 tablets twice per day.  For your appetite, we are starting a medication called Megace, which is an appetite stimulant.  Take 800 mg once per day.  Please return for a follow-up visit with your primary doctor in 1-2 months.

## 2012-10-02 NOTE — Assessment & Plan Note (Signed)
The patient notes weight loss, due to poor appetite.  In terms of cancer screening, mammogram last year was negative (next due in Nov 2013, referral sent), the patient is s/p hysterectomy, and the patient had a colonoscopy last year.  Furthermore, she notes no concerning GI symptoms such as dysphagia or odynophagia, and follows regularly with a gastroenterologist for her colitis.  The etiology of her weight loss is likely related to the patient's anxiety, managed at [facility]. -start Megace for appetite stimulation

## 2012-10-10 ENCOUNTER — Other Ambulatory Visit: Payer: Self-pay | Admitting: *Deleted

## 2012-10-10 MED ORDER — CYCLOBENZAPRINE HCL 5 MG PO TABS: 5.0000 mg | ORAL_TABLET | Freq: Two times a day (BID) | ORAL | Status: DC | PRN

## 2012-10-27 ENCOUNTER — Ambulatory Visit (HOSPITAL_COMMUNITY)
Admission: RE | Admit: 2012-10-27 | Discharge: 2012-10-27 | Disposition: A | Discharge status: Home / Self Care | Payer: Medicaid Other | Source: Ambulatory Visit | Attending: Internal Medicine | Admitting: Internal Medicine

## 2012-10-27 DIAGNOSIS — Z1231 Encounter for screening mammogram for malignant neoplasm of breast: Secondary | ICD-10-CM

## 2012-10-27 DIAGNOSIS — Z1239 Encounter for other screening for malignant neoplasm of breast: Secondary | ICD-10-CM

## 2012-11-23 ENCOUNTER — Other Ambulatory Visit (HOSPITAL_COMMUNITY)
Admission: RE | Admit: 2012-11-23 | Discharge: 2012-11-23 | Disposition: A | Discharge status: Home / Self Care | Payer: Medicaid Other | Source: Ambulatory Visit | Attending: Internal Medicine | Admitting: Internal Medicine

## 2012-11-23 ENCOUNTER — Ambulatory Visit (INDEPENDENT_AMBULATORY_CARE_PROVIDER_SITE_OTHER): Payer: Medicaid Other | Admitting: Internal Medicine

## 2012-11-23 ENCOUNTER — Encounter: Payer: Self-pay | Admitting: Internal Medicine

## 2012-11-23 VITALS — BP 129/73 | HR 91 | Temp 97.7°F | Ht 66.0 in | Wt 146.7 lb

## 2012-11-23 DIAGNOSIS — F32A Depression, unspecified: Secondary | ICD-10-CM

## 2012-11-23 DIAGNOSIS — F329 Major depressive disorder, single episode, unspecified: Secondary | ICD-10-CM

## 2012-11-23 DIAGNOSIS — R634 Abnormal weight loss: Secondary | ICD-10-CM

## 2012-11-23 DIAGNOSIS — Z129 Encounter for screening for malignant neoplasm, site unspecified: Secondary | ICD-10-CM

## 2012-11-23 DIAGNOSIS — F17200 Nicotine dependence, unspecified, uncomplicated: Secondary | ICD-10-CM

## 2012-11-23 DIAGNOSIS — R87810 Cervical high risk human papillomavirus (HPV) DNA test positive: Secondary | ICD-10-CM | POA: Insufficient documentation

## 2012-11-23 DIAGNOSIS — Z720 Tobacco use: Secondary | ICD-10-CM

## 2012-11-23 DIAGNOSIS — Z01419 Encounter for gynecological examination (general) (routine) without abnormal findings: Secondary | ICD-10-CM | POA: Insufficient documentation

## 2012-11-23 DIAGNOSIS — N760 Acute vaginitis: Secondary | ICD-10-CM | POA: Insufficient documentation

## 2012-11-23 DIAGNOSIS — F319 Bipolar disorder, unspecified: Secondary | ICD-10-CM

## 2012-11-23 DIAGNOSIS — Z1151 Encounter for screening for human papillomavirus (HPV): Secondary | ICD-10-CM | POA: Insufficient documentation

## 2012-11-23 NOTE — Patient Instructions (Addendum)
1. Follow up in 3 months 2.  General Instructions:    Treatment Goals:  Goals (1 Years of Data) as of 11/23/2012              Diet    . Have 3 meals a day      Lifestyle    . Increase physical activity       Progress Toward Treatment Goals:  Treatment Goal 11/23/2012  Stop smoking smoking the same amount    Self Care Goals & Plans:  Self Care Goal 11/23/2012  Manage my medications take my medicines as prescribed; refill my medications on time; bring my medications to every visit; follow the sick day instructions if I am sick  Eat healthy foods eat more vegetables  Be physically active take a walk every day  Stop smoking set a quit date and stop smoking       Care Management & Community Referrals:  Referral 11/23/2012  Referrals made for care management support other (see comment)

## 2012-11-23 NOTE — Assessment & Plan Note (Signed)
Stable, continue to follow up with her Psychiatrist  - continue Remeron.

## 2012-11-23 NOTE — Assessment & Plan Note (Signed)
Stable, follow up with her Psychiatrist regularly.  - continue the current regimen

## 2012-11-23 NOTE — Assessment & Plan Note (Addendum)
Continue to smoke cig. 1/2 pack daily  - smoking cessation discussed.  - patient refused to quit now and states that she would consider it after the holiday.   Assessment:  Progress toward smoking cessation:  smoking the same amount  Barriers to progress toward smoking cessation:  lack of motivation to quit;stress  Comments: refused to quit now. Would like to consider it after the holiday.  Plan:  Instruction/counseling given:  I counseled patient on the dangers of tobacco use, advised patient to stop smoking and reviewed strategies to maximize success.  Educational resources provided:  [employer], [employer]) brochure  Self management tools provided:   yes  Medications to assist with smoking cessation:  Nicotine Lozenge Patient agreed to the following self-care plans for smoking cessation:  set a quit date and stop smoking

## 2012-11-23 NOTE — Assessment & Plan Note (Signed)
Better controlled. On Remeron and has increased appetite. No recent weight loss.  Actually gain a few pounds

## 2012-11-23 NOTE — Progress Notes (Signed)
Subjective:   Patient ID: Monique Perry female   DOB: [birth year] 56 y.o.   MRN: [id number]  HPI: [name] is a 56 y.o. woman with PMH of polysubstance abuse, sleep depravation, collagenous colitis followed by GI Dr. Elnoria Howard, who presents to the clinic for follow up visit.  She has followed with Dr. Elnoria Howard for the management of Collagenous colitis and told me that she had negative EGD and colonoscopy.  She was admitted to the hospital during 8/8- /11/13 for AG gap metobolic acidosis associated with starvation Ketoacidosis, chest pain and headache. Poor nutrition intake was thought to be the cause. She has lost 25 lbs in last 3 months. She was sent home with breeze nutritional drinks.   1. Anxiety/Depression     Patient reports a lot of stressors from her life and she follows up with Her Psychiatrist at [facility]. She goes to the class for substance abuse and mental disorders on weekly basis. She also sees her Psychiatrist monthly and her next appt is on 12/12/12. She is on Remeron 15 mg po daily.   She reports that she is not depressed, and does not have SI/HI.  She understand that she needs to call 911 if she experience SI/HI or manic episodes.    2. substance abuse    History of substances abuse including ETOH, Marijuana, cocaine and controlled substances. She stopped all drug abuse 10 years.   3. Tobacco abuse     She does not want to stop smoking now. She is offered free patch and does not want to use it now.  Risk of Tobacco abuse is discussed including but not limiting cancer, cardiovascular disease and pulmonary disease.  4. Left nose skin lesion Patient reports left nose skin lesion for 7-8 month. No pain or discomfort. No ulcer noted.  Dermatology appt in May 2014.    Health maintenance 1. Flu shot>>done 2. Colonoscopy>>> in 2012 by Dr. Elnoria Howard 3. Pap smear>> today 4. Tetanus shot>>> in 2011   Past Medical History  Diagnosis Date  . Alcohol abuse last February 2012  . Drug  abuse last 1990's  . Arthritis   . Collagenous colitis   . Bipolar disorder    Current Outpatient Prescriptions  Medication Sig Dispense Refill  . calcium citrate-vitamin D (CITRACAL+D) 315-200 MG-UNIT per tablet Take 2 tablets by mouth 2 (two) times daily.  120 tablet  3  . cyclobenzaprine (FLEXERIL) 5 MG tablet Take 1 tablet (5 mg total) by mouth 2 (two) times daily as needed for muscle spasms.  60 tablet  0  . mirtazapine (REMERON) 15 MG tablet Take 15 mg by mouth at bedtime.       Family History  Problem Relation Age of Onset  . Diabetes Father   . Hypertension Father   . Hyperlipidemia Father   . Stroke Mother    History   Social History  . Marital Status: Single    Spouse Name: N/A    Number of Children: N/A  . Years of Education: N/A   Social History Main Topics  . Smoking status: Current Every Day Smoker -- 0.5 packs/day    Types: Cigars  . Smokeless tobacco: None     Comment: smoking cig for 35 years. continues to smoke 1/2 pack daily  . Alcohol Use: Yes     Comment: ETOH abuse for 25 year, 1/2 gallon Liquor daily. admits 6 weeks course of drinking ETOH as her only intake in 2011. she states that she quits her drinnking  since Feb 2012.  . Drug Use: No     Comment: pots before, clean since 1995  . Sexually Active: None   Other Topics Concern  . None   Social History Narrative   She lives with his stepson in [place]. Moved from [place] 2 years ago. Both  Parents lives in [place]. 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 Did not complete High School . She admits only 2-3 hours daily for 15 years. She does not sleep at nighttime. She would watch TV and read at night time. Always sleep during the day. She has seen a psychiatrist at [facility] and still goes to the group therapy on Mondays. She has an follow up appt with her Psychiatrist on 08/07/12.   Review of Systems: Review of Systems:  Constitutional:   Denies fever, chills, diaphoresis  HEENT:  Denies congestion, sore throat, rhinorrhea, sneezing, mouth sores, trouble swallowing, neck pain   Respiratory:  Denies SOB, DOE, cough, and wheezing.   Cardiovascular:  Denies palpitations and leg swelling.   Gastrointestinal:  Denies nausea, vomiting, abdominal pain, diarrhea, constipation, blood in stool and abdominal distention.   Genitourinary:  Denies dysuria, urgency, frequency, hematuria, flank pain and difficulty urinating.   Musculoskeletal:  Denies myalgias, back pain, joint swelling, arthralgias and gait problem.   Skin:  Denies pallor, rash and wound.   Neurological:  Denies dizziness, seizures, syncope, weakness, light-headedness, numbness .    Marland Kitchen   Objective:  Physical Exam: Filed Vitals:   11/23/12 1426  BP: 129/73  Pulse: 91  Temp: 97.7 F (36.5 C)  TempSrc: Oral  Height: 5\' 6"  (1.676 m)  Weight: 146 lb 11.2 oz (66.543 kg)  SpO2: 99%   General: alert, well-developed, and cooperative to examination.  Head: normocephalic and atraumatic.  Eyes: vision grossly intact, pupils equal, pupils round, pupils reactive to light, no injection and anicteric.  Mouth: pharynx pink and moist, no erythema, and no exudates. Nose 3 mm pink skin lesion noted on left side of her nose tip.  No ulcer noted Neck: supple, full ROM, no thyromegaly, no JVD, and no carotid bruits.  Lungs: normal respiratory effort, no accessory muscle use, normal breath sounds, no crackles, and no wheezes. Heart: normal rate, regular rhythm, no murmur, no gallop, and no rub.  Abdomen: soft, non-tender, normal bowel sounds, no distention, no guarding, no rebound tenderness, no hepatomegaly, and no splenomegaly.  Msk: no joint swelling, no joint warmth, and no redness over joints.  Pulses: 2+ DP/PT pulses bilaterally Extremities: No cyanosis, clubbing, edema Neurologic: alert & oriented X3, cranial nerves II-XII intact, strength normal in all extremities, sensation  intact to light touch  Skin: turgor normal and no rashes.  Psych: Oriented X3, memory intact for recent and remote, normally interactive, good eye contact, not anxious appearing, and not depressed appearing.   Assessment & Plan:

## 2012-12-14 ENCOUNTER — Other Ambulatory Visit: Payer: Self-pay | Admitting: Internal Medicine

## 2012-12-14 DIAGNOSIS — IMO0002 Reserved for concepts with insufficient information to code with codable children: Secondary | ICD-10-CM

## 2012-12-15 ENCOUNTER — Other Ambulatory Visit: Payer: Self-pay | Admitting: Internal Medicine

## 2012-12-15 MED ORDER — CYCLOBENZAPRINE HCL 5 MG PO TABS: 5.0000 mg | ORAL_TABLET | Freq: Two times a day (BID) | ORAL | Status: DC | PRN

## 2012-12-15 MED ORDER — METRONIDAZOLE 500 MG PO TABS: 500.0000 mg | ORAL_TABLET | Freq: Two times a day (BID) | ORAL | Status: DC

## 2012-12-21 ENCOUNTER — Encounter: Payer: Self-pay | Admitting: Obstetrics & Gynecology

## 2013-01-05 ENCOUNTER — Encounter: Payer: Self-pay | Admitting: Obstetrics & Gynecology

## 2013-01-05 ENCOUNTER — Ambulatory Visit (INDEPENDENT_AMBULATORY_CARE_PROVIDER_SITE_OTHER): Payer: Medicaid Other | Admitting: Obstetrics & Gynecology

## 2013-01-05 VITALS — BP 152/87 | HR 94 | Temp 96.9°F | Ht 65.5 in | Wt 144.0 lb

## 2013-01-05 DIAGNOSIS — Z0000 Encounter for general adult medical examination without abnormal findings: Secondary | ICD-10-CM | POA: Insufficient documentation

## 2013-01-05 DIAGNOSIS — Z01419 Encounter for gynecological examination (general) (routine) without abnormal findings: Secondary | ICD-10-CM

## 2013-01-05 NOTE — Patient Instructions (Signed)
Pap Test Pap tests are obtained from the cervix and vagina to detect the presence of cancer cells or infections. A Pap test is done to screen for cervical cancer.  Who should have Pap tests:  The first Pap test should be done at age 57.   Between ages 21 and 29, Pap tests are repeated every 2 years.   Beginning at age 30, you are advised to have a Pap test every 3 years as long as your past 3 Pap tests have been normal.   Some women have medical problems that increase the chance of getting cervical cancer. Talk to your caregiver about these problems. It is especially important to talk to your caregiver if a new problem develops soon after your last Pap test. In these cases, your caregiver may recommend more frequent screening and Pap tests.   The above recommendations are the same for women who have or have not gotten the vaccine for HPV (Human Papillomavirus).   If you had a hysterectomy for a problem that was not a cancer or a condition that could lead to cancer, then you no longer need Pap tests. However, even if you no longer need a Pap test, a regular exam is a good idea to make sure no other problems are starting.    If you are between ages 65 and 70, and you have had normal Pap tests going back 10 years, you no longer need Pap tests. However, even if you no longer need a Pap test, a regular exam is a good idea to make sure no other problems are starting.    If you have had past treatment for cervical cancer or a condition that could lead to cancer, you need Pap tests and screening for cancer for at least 20 years after your treatment.   If Pap tests have been discontinued, risk factors (such as a new sexual partner) need to re-assessed to determine if screening should be resumed.   Some women may need screenings more often if they are at high risk for cervical cancer.  Ask your caregiver if you should have one done more or less often. A Pap test should be performed during the  weeks before the start of menstruation. It is not as good a test during your menstrual period. Do not douche or have intercourse in the 24 hours before your exam. The specimen is usually taken from the cervix with a scraper, swab, or brush, and it is spread on a slide. This is then carefully examined with a microscope by a specialist. If bleeding occurs from the scraping, it will almost always be minor. The results of your Pap test should be available in 1 to 2 weeks. Make sure you know how you are to get your results. Call your caregiver if you have questions or concerns about your exam and test results. For the protection of your privacy, test results cannot be given over the phone. Make sure you find out the results of the Pap test. If you have not received the results within two weeks, contact your caregiver's office for the results. Do not assume everything is normal if you have not heard from your caregiver or medical facility. It is important to follow up on all of your test results. Document Released: 01/06/2005 Document Revised: 08/11/2011 Document Reviewed: 04/22/2008 ExitCare Patient Information 2012 [employer]. 

## 2013-01-05 NOTE — Progress Notes (Signed)
Patient ID: Monique Perry, female   DOB: 03-08-1956, 57 y.o.   MRN: [id number]  History:  Monique Perry is a 57 y.o. G3P0030 who presents to clinic today for colposcopy following a LGSIL pap. The patient was sent from Dr. Dierdre Searles Na who performed her pap smear. The patient had not had a pap smear in many years prior to this. The patient states that she had a hysterectomy in the mid 1980s and is unsure if they left her cervix. She is a current everyday smoker. She has not had any sexual partners in the last 3 years. She gets yearly mammograms, the last of which was in September. The patient denies any other GYN concerns today.   The following portions of the patient's history were reviewed and updated as appropriate: allergies, current medications, past family history, past medical history, past social history, past surgical history and problem list.  Review of Systems:  Pertinent items are noted in HPI.  Objective:  Physical Exam BP 152/87  Pulse 94  Temp 96.9 F (36.1 C) (Oral)  Ht 5' 5.5" (1.664 m)  Wt 144 lb (65.318 kg)  BMI 23.60 kg/m2 GENERAL: Well-developed, well-nourished female in no acute distress.  HEENT: Normocephalic.  LUNGS: Normal rate.  HEART: Regular rate.  PELVIC: Normal external female genitalia. Vagina is pink and rugated.  Normal discharge. Well-healed vaginal cuff, cervix is surgically absent.  EXTREMITIES: No cyanosis, clubbing, or edema.  MDM Discussed and examined patient with Dr. Erin Fulling. Original pap result indicated that endocervical cells were found. Cervix is surgically absent on exam. Vaginal cuff is well-healed. No abnormalities or lesions noted.  Dr. Erin Fulling has contacted cytology regarding the results of the pap smear. They will addend the pap smear results to reflect the absence of cervix in the patient.   Assessment & Plan:  Assessment: Normal exam; s/p hysterectomy  Plans: Patient will continue to get annual exams with her PCP. Pap smears  are no longer indicated.  Patient will continue to get annual mammograms Smoking cessation encouraged Patient may return to Clinic as needed  Freddi Starr, PA-C 01/05/2013 12:01 PM

## 2013-01-10 ENCOUNTER — Other Ambulatory Visit: Payer: Self-pay | Admitting: Internal Medicine

## 2013-01-10 MED ORDER — CYCLOBENZAPRINE HCL 5 MG PO TABS: 5.0000 mg | ORAL_TABLET | Freq: Two times a day (BID) | ORAL | Status: DC | PRN

## 2013-01-12 ENCOUNTER — Other Ambulatory Visit: Payer: Self-pay | Admitting: Internal Medicine

## 2013-01-12 MED ORDER — CYCLOBENZAPRINE HCL 5 MG PO TABS: 5.0000 mg | ORAL_TABLET | Freq: Two times a day (BID) | ORAL | Status: DC | PRN

## 2013-01-26 ENCOUNTER — Encounter: Payer: Medicaid Other | Admitting: Obstetrics & Gynecology

## 2013-01-29 ENCOUNTER — Telehealth: Payer: Self-pay | Admitting: *Deleted

## 2013-01-29 NOTE — Telephone Encounter (Signed)
Monique Perry left a message calling to confirm her appointment for 01/27/12 was canceled due to weather- and needs to reschedule

## 2013-02-08 ENCOUNTER — Ambulatory Visit (INDEPENDENT_AMBULATORY_CARE_PROVIDER_SITE_OTHER): Payer: Medicaid Other | Admitting: Internal Medicine

## 2013-02-08 ENCOUNTER — Encounter: Payer: Self-pay | Admitting: Internal Medicine

## 2013-02-08 VITALS — BP 160/90 | HR 90 | Temp 97.8°F | Ht 66.0 in | Wt 145.9 lb

## 2013-02-08 DIAGNOSIS — Z720 Tobacco use: Secondary | ICD-10-CM

## 2013-02-08 DIAGNOSIS — F32A Depression, unspecified: Secondary | ICD-10-CM

## 2013-02-08 DIAGNOSIS — R030 Elevated blood-pressure reading, without diagnosis of hypertension: Secondary | ICD-10-CM

## 2013-02-08 DIAGNOSIS — F3289 Other specified depressive episodes: Secondary | ICD-10-CM

## 2013-02-08 DIAGNOSIS — I10 Essential (primary) hypertension: Secondary | ICD-10-CM

## 2013-02-08 DIAGNOSIS — M549 Dorsalgia, unspecified: Secondary | ICD-10-CM

## 2013-02-08 DIAGNOSIS — F17200 Nicotine dependence, unspecified, uncomplicated: Secondary | ICD-10-CM

## 2013-02-08 DIAGNOSIS — I70219 Atherosclerosis of native arteries of extremities with intermittent claudication, unspecified extremity: Secondary | ICD-10-CM

## 2013-02-08 DIAGNOSIS — G8929 Other chronic pain: Secondary | ICD-10-CM

## 2013-02-08 DIAGNOSIS — R0989 Other specified symptoms and signs involving the circulatory and respiratory systems: Secondary | ICD-10-CM

## 2013-02-08 DIAGNOSIS — F329 Major depressive disorder, single episode, unspecified: Secondary | ICD-10-CM

## 2013-02-08 MED ORDER — CYCLOBENZAPRINE HCL 5 MG PO TABS: 5.0000 mg | ORAL_TABLET | Freq: Two times a day (BID) | ORAL | Status: DC | PRN

## 2013-02-08 MED ORDER — MIRTAZAPINE 15 MG PO TABS: 15.0000 mg | ORAL_TABLET | Freq: Every day | ORAL | Status: DC

## 2013-02-08 MED ORDER — VARENICLINE TARTRATE 1 MG PO TABS: 1.0000 mg | ORAL_TABLET | Freq: Two times a day (BID) | ORAL | Status: DC

## 2013-02-08 MED ORDER — HYDROCODONE-ACETAMINOPHEN 5-325 MG PO TABS: 1.0000 | ORAL_TABLET | Freq: Four times a day (QID) | ORAL | Status: DC | PRN

## 2013-02-08 NOTE — Assessment & Plan Note (Signed)
I will give her at least two months supply of Remeron until she can see her psychiatrist.

## 2013-02-08 NOTE — Patient Instructions (Addendum)
1. Will refer you to sports medicine 2. Will order ABI and carotid doppler 3. Stop smoking.  4. Follow up in 3 months

## 2013-02-08 NOTE — Assessment & Plan Note (Addendum)
Chronic back pain for years. No focal neuro deficit. No sciatica pain.  Some improvement with flexeril.    - will send sports medicine referral  - will continue flexeril  - will give her Vicodin

## 2013-02-08 NOTE — Assessment & Plan Note (Signed)
Her clinical manifestation is consistent with the Dx. Especially given her long term heavy tobacco abuse.  - will check her ABI

## 2013-02-08 NOTE — Assessment & Plan Note (Signed)
Patient is noted to have higher blood pressure at the clinic today. Per chart review, she has not had high BPs in the past. 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   - I instructed her to check her BP at home daily and if it continues to be elevated, give [place] a call.

## 2013-02-08 NOTE — Progress Notes (Signed)
Subjective:   Patient ID: Monique Perry female   DOB: [birth year] 57 y.o.   MRN: [id number]  HPI: [name] is a 57 y.o. woman with PMH of polysubstance abuse, sleep depravation, collagenous colitis followed by GI Dr. Elnoria Howard, who presents to the clinic for follow up visit.  She has followed with Dr. Elnoria Howard for the management of Collagenous colitis and told me that she had negative EGD and colonoscopy.  She was admitted to the hospital during 8/8- /11/13 for AG gap metobolic acidosis associated with starvation Ketoacidosis, chest pain and headache. Poor nutrition intake was thought to be the cause. She was sent home with breeze nutritional drinks.   # Back pain Patient has had chronic back for years with DJD from extensive workup by her previous physician. Denies any hx of herniated disc. Has never had physical therapy or seen a sports medicine MD. She has been taking Flexeril PRN with some improvement.  Heavy lifting or long time standing make it worse. Warm compress and massage make it better. She does not take pain medication.   #. Anxiety/Depression     Patient reports a lot of stressors from her life and she follows up with Her Psychiatrist at [facility]. She goes to the class for substance abuse and mental disorders on weekly basis. She also sees her Psychiatrist monthly. She is on Remeron 15 mg po daily.   But she ran out Remeron on 01/13/13 and has been taking it since. She states that she could not get the appt with Psychiatrist.   She reports that she is not depressed, and does not have SI/HI.  She understand that she needs to call 911 if she experience SI/HI or manic episodes.    # Claudication of lower extremity Patient has had symptoms for several years and never told any physician before. She reports her feet feeling cold.   #. substance abuse    History of substances abuse including ETOH, Marijuana, cocaine and controlled substances. She stopped all drug abuse 10 years.   #.  Tobacco abuse     She does not want to stop smoking now. She is offered free patch and does not want to use it now.  Risk of Tobacco abuse is discussed including but not limiting cancer, cardiovascular disease and pulmonary disease.  #. Left nose skin lesion Patient reports left nose skin lesion for 7-8 month. No pain or discomfort. No ulcer noted.  Dermatology appt in May 2014.     Past Medical History  Diagnosis Date  . Alcohol abuse last February 2012  . Drug abuse last 1990's  . Arthritis   . Collagenous colitis   . Bipolar disorder    Current Outpatient Prescriptions  Medication Sig Dispense Refill  . calcium citrate-vitamin D (CITRACAL+D) 315-200 MG-UNIT per tablet Take 1 tablet by mouth 2 (two) times daily. She takes Calcium Vitamin D 600-800 1 tab BID      . cyclobenzaprine (FLEXERIL) 5 MG tablet Take 1 tablet (5 mg total) by mouth 2 (two) times daily as needed for muscle spasms.  60 tablet  0  . mirtazapine (REMERON) 15 MG tablet Take 15 mg by mouth at bedtime.       No current facility-administered medications for this visit.   Family History  Problem Relation Age of Onset  . Diabetes Father   . Hypertension Father   . Hyperlipidemia Father   . Stroke Mother    History   Social History  . Marital Status: Single  Spouse Name: N/A    Number of Children: N/A  . Years of Education: N/A   Social History Main Topics  . Smoking status: Current Every Day Smoker -- 0.50 packs/day    Types: Cigars  . Smokeless tobacco: Never Used     Comment: smoking cig for 35 years. continues to smoke 1/2 pack daily  . Alcohol Use: Yes     Comment: ETOH abuse for 25 year, 1/2 gallon Liquor daily. admits 6 weeks course of drinking ETOH as her only intake in 2011. she states that she quits her drinnking since Feb 2012.  . Drug Use: No     Comment: pots before, clean since 1995  . Sexually Active: No   Other Topics Concern  . None   Social History Narrative   She lives with  his stepson in [place]. Moved from [place] 2 years ago. Both  Parents lives in [place]. 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 Did not complete High School .       She admits only 2-3 hours daily for 15 years. She does not sleep at nighttime. She would watch TV and read at night time. Always sleep during the day. She has seen a psychiatrist at [facility] and still goes to the group therapy on Mondays. She has an follow up appt with her Psychiatrist on 08/07/12.   Review of Systems: Negative except for s/s mentioned in HPI.   Objective:  Physical Exam: Filed Vitals:   02/08/13 1427  BP: 175/85  Pulse: 90  Temp: 97.8 F (36.6 C)  TempSrc: Oral  Height: 5\' 6"  (1.676 m)  Weight: 145 lb 14.4 oz (66.18 kg)  SpO2: 99%   General: alert, well-developed, and cooperative to examination.  Head: normocephalic and atraumatic. Mild carotid bruit noted on right side,  Eyes: vision grossly intact, pupils equal, pupils round, pupils reactive to light, no injection and anicteric.  Mouth: pharynx pink and moist, no erythema, and no exudates. Nose 3 mm pink skin lesion noted on left side of her nose tip.  No ulcer noted Neck: supple, full ROM, no thyromegaly, no JVD, and no carotid bruits.  Lungs: normal respiratory effort, no accessory muscle use, normal breath sounds, no crackles, and no wheezes. Heart: normal rate, regular rhythm, no murmur, no gallop, and no rub.  Abdomen: soft, non-tender, normal bowel sounds, no distention, no guarding, no rebound tenderness, no hepatomegaly, and no splenomegaly.  Msk: paraspinal tender to palpation. Straight raising leg test negative Pulses: 2+ petal pulse bilaterally. Unable to detect posterial tibia pulse.  Extremities: No cyanosis, clubbing, edema Neurologic: alert & oriented X3, cranial nerves II-XII intact, strength normal in all extremities, sensation intact to light touch  Skin: turgor normal  and no rashes.  Psych: Oriented X3, memory intact for recent and remote, normally interactive, good eye contact, not anxious appearing, and not depressed appearing.   Assessment & Plan:

## 2013-02-08 NOTE — Assessment & Plan Note (Signed)
I reinforced to her that it is very important for her to stop smoking. And I am afraid that she has already had some vascular disease from her smoking.   - Patient has nicotine patch at home, but she does not want to stop smoking even though she was given educations.  - I will give her chantix

## 2013-02-09 ENCOUNTER — Encounter: Payer: Self-pay | Admitting: Internal Medicine

## 2013-02-15 ENCOUNTER — Other Ambulatory Visit: Payer: Self-pay | Admitting: *Deleted

## 2013-02-15 ENCOUNTER — Encounter: Payer: Medicaid Other | Admitting: Internal Medicine

## 2013-02-19 ENCOUNTER — Ambulatory Visit (INDEPENDENT_AMBULATORY_CARE_PROVIDER_SITE_OTHER): Payer: Medicaid Other | Admitting: Obstetrics & Gynecology

## 2013-02-19 ENCOUNTER — Encounter: Payer: Self-pay | Admitting: Obstetrics & Gynecology

## 2013-02-19 VITALS — BP 138/78 | HR 86 | Temp 96.7°F | Ht 66.0 in | Wt 147.7 lb

## 2013-02-19 DIAGNOSIS — N893 Dysplasia of vagina, unspecified: Secondary | ICD-10-CM

## 2013-02-19 DIAGNOSIS — N890 Mild vaginal dysplasia: Secondary | ICD-10-CM

## 2013-02-19 NOTE — Progress Notes (Signed)
Patient ID: Monique Perry, female   DOB: 1956/07/28, 57 y.o.   MRN: [id number] Pt is a 57 yo WF  here for vulvar/vaginal colposcopy for VAIN pap smear.  PAP was initially read as having endocervical cells with LGSIL.  The lab was notified that the patient was s/p a hyst and the patient was sent home. The f/u PAP return as LOW GRADE SQUAMOUS INTRAEPITHELIAL LESION: VAIN-1/ HPV (LSIL) from 11/23/2012. I reviewed with the patient the need for tobacco cessation and need for surveillance.  Patient given informed consent, signed copy in the chart, time out was performed.  Placed in lithotomy position. Vagina and vulva viewed with speculum and colposcope after application of acetic acid.   Colposcopy adequate? Yes ( note cervix ABSENT)  no visible lesions;no  biopsies obtained.  Patient was given post procedure instructions.   Routine preventative health maintenance measures emphasized. Rec tobacco cessation

## 2013-02-19 NOTE — Patient Instructions (Addendum)
Smoking Cessation Quitting smoking is important to your health and has many advantages. However, it is not always easy to quit since nicotine is a very addictive drug. Often times, people try 3 times or more before being able to quit. This document explains the best ways for you to prepare to quit smoking. Quitting takes hard work and a lot of effort, but you can do it. ADVANTAGES OF QUITTING SMOKING  You will live longer, feel better, and live better.  Your body will feel the impact of quitting smoking almost immediately.  Within 20 minutes, blood pressure decreases. Your pulse returns to its normal level.  After 8 hours, carbon monoxide levels in the blood return to normal. Your oxygen level increases.  After 24 hours, the chance of having a heart attack starts to decrease. Your breath, hair, and body stop smelling like smoke.  After 48 hours, damaged nerve endings begin to recover. Your sense of taste and smell improve.  After 72 hours, the body is virtually free of nicotine. Your bronchial tubes relax and breathing becomes easier.  After 2 to 12 weeks, lungs can hold more air. Exercise becomes easier and circulation improves.  The risk of having a heart attack, stroke, cancer, or lung disease is greatly reduced.  After 1 year, the risk of coronary heart disease is cut in half.  After 5 years, the risk of stroke falls to the same as a nonsmoker.  After 10 years, the risk of lung cancer is cut in half and the risk of other cancers decreases significantly.  After 15 years, the risk of coronary heart disease drops, usually to the level of a nonsmoker.  If you are pregnant, quitting smoking will improve your chances of having a healthy baby.  The people you live with, especially any children, will be healthier.  You will have extra money to spend on things other than cigarettes. QUESTIONS TO THINK ABOUT BEFORE ATTEMPTING TO QUIT You may want to talk about your answers with your  caregiver.  Why do you want to quit?  If you tried to quit in the past, what helped and what did not?  What will be the most difficult situations for you after you quit? How will you plan to handle them?  Who can help you through the tough times? Your family? Friends? A caregiver?  What pleasures do you get from smoking? What ways can you still get pleasure if you quit? Here are some questions to ask your caregiver:  How can you help me to be successful at quitting?  What medicine do you think would be best for me and how should I take it?  What should I do if I need more help?  What is smoking withdrawal like? How can I get information on withdrawal? GET READY  Set a quit date.  Change your environment by getting rid of all cigarettes, ashtrays, matches, and lighters in your home, car, or work. Do not let people smoke in your home.  Review your past attempts to quit. Think about what worked and what did not. GET SUPPORT AND ENCOURAGEMENT You have a better chance of being successful if you have help. You can get support in many ways.  Tell your family, friends, and co-workers that you are going to quit and need their support. Ask them not to smoke around you.  Get individual, group, or telephone counseling and support. Programs are available at local hospitals and health centers. Call your local health department for   information about programs in your area.  Spiritual beliefs and practices may help some smokers quit.  Download a "quit meter" on your computer to keep track of quit statistics, such as how long you have gone without smoking, cigarettes not smoked, and money saved.  Get a self-help book about quitting smoking and staying off of tobacco. LEARN NEW SKILLS AND BEHAVIORS  Distract yourself from urges to smoke. Talk to someone, go for a walk, or occupy your time with a task.  Change your normal routine. Take a different route to work. Drink tea instead of coffee.  Eat breakfast in a different place.  Reduce your stress. Take a hot bath, exercise, or read a book.  Plan something enjoyable to do every day. Reward yourself for not smoking.  Explore interactive web-based programs that specialize in helping you quit. GET MEDICINE AND USE IT CORRECTLY Medicines can help you stop smoking and decrease the urge to smoke. Combining medicine with the above behavioral methods and support can greatly increase your chances of successfully quitting smoking.  Nicotine replacement therapy helps deliver nicotine to your body without the negative effects and risks of smoking. Nicotine replacement therapy includes nicotine gum, lozenges, inhalers, nasal sprays, and skin patches. Some may be available over-the-counter and others require a prescription.  Antidepressant medicine helps people abstain from smoking, but how this works is unknown. This medicine is available by prescription.  Nicotinic receptor partial agonist medicine simulates the effect of nicotine in your brain. This medicine is available by prescription. Ask your caregiver for advice about which medicines to use and how to use them based on your health history. Your caregiver will tell you what side effects to look out for if you choose to be on a medicine or therapy. Carefully read the information on the package. Do not use any other product containing nicotine while using a nicotine replacement product.  RELAPSE OR DIFFICULT SITUATIONS Most relapses occur within the first 3 months after quitting. Do not be discouraged if you start smoking again. Remember, most people try several times before finally quitting. You may have symptoms of withdrawal because your body is used to nicotine. You may crave cigarettes, be irritable, feel very hungry, cough often, get headaches, or have difficulty concentrating. The withdrawal symptoms are only temporary. They are strongest when you first quit, but they will go away within  10 14 days. To reduce the chances of relapse, try to:  Avoid drinking alcohol. Drinking lowers your chances of successfully quitting.  Reduce the amount of caffeine you consume. Once you quit smoking, the amount of caffeine in your body increases and can give you symptoms, such as a rapid heartbeat, sweating, and anxiety.  Avoid smokers because they can make you want to smoke.  Do not let weight gain distract you. Many smokers will gain weight when they quit, usually less than 10 pounds. Eat a healthy diet and stay active. You can always lose the weight gained after you quit.  Find ways to improve your mood other than smoking. FOR MORE INFORMATION  www.smokefree.gov  Document Released: 11/23/2001 Document Revised: 05/30/2012 Document Reviewed: 03/09/2012 [employer] Patient Information 2013 [employer].  

## 2013-02-20 ENCOUNTER — Ambulatory Visit (INDEPENDENT_AMBULATORY_CARE_PROVIDER_SITE_OTHER): Payer: Medicaid Other | Admitting: Family Medicine

## 2013-02-20 ENCOUNTER — Encounter: Payer: Self-pay | Admitting: Family Medicine

## 2013-02-20 VITALS — BP 131/76 | HR 103 | Ht 66.0 in | Wt 147.0 lb

## 2013-02-20 DIAGNOSIS — G8929 Other chronic pain: Secondary | ICD-10-CM

## 2013-02-20 DIAGNOSIS — M545 Low back pain, unspecified: Secondary | ICD-10-CM

## 2013-02-20 DIAGNOSIS — M549 Dorsalgia, unspecified: Secondary | ICD-10-CM

## 2013-02-20 DIAGNOSIS — M79604 Pain in right leg: Secondary | ICD-10-CM

## 2013-02-20 MED ORDER — PREDNISONE 50 MG PO TABS: ORAL_TABLET | ORAL | Status: DC

## 2013-02-20 MED ORDER — MELOXICAM 15 MG PO TABS: ORAL_TABLET | ORAL | Status: DC

## 2013-02-20 MED ORDER — TRAMADOL HCL 50 MG PO TABS: 50.0000 mg | ORAL_TABLET | Freq: Four times a day (QID) | ORAL | Status: DC | PRN

## 2013-02-20 NOTE — Progress Notes (Signed)
Chief complaint: Low back pain  History of present illness: Patient is a 57 year old female who is coming in with right-sided low back pain. Patient states that she is at this for multiple years and has been diagnosed with degenerative disc disease previously. Patient states that the pain seems to be worsening it is very localized and points over near the right SI joint. Patient states that his pain came from time to time radiate down the posterior aspect of her right leg and even goes to her calf but never to her ankle. Patient denies any numbness or any significant weakness. Patient denies any swelling. Patient denies any type of trauma to the area. Patient describes the pain more distal chronic ache but then has times of a sharp burning sensation.   Past Medical History  Diagnosis Date  . Alcohol abuse last February 2012  . Drug abuse last 1990's  . Arthritis   . Collagenous colitis   . Bipolar disorder    Past Surgical History  Procedure Laterality Date  . Nose surgery    . Abdominal hysterectomy  Age 27 years ago    partial   . Tonsillectomy  As a child   . Wrist surgery    . Foot surgery     Family History  Problem Relation Age of Onset  . Diabetes Father   . Hypertension Father   . Hyperlipidemia Father   . Stroke Mother    History  Substance Use Topics  . Smoking status: Current Every Day Smoker -- 0.50 packs/day    Types: Cigars  . Smokeless tobacco: Never Used     Comment: smoking cig for 35 years. continues to smoke 1/2 pack daily  . Alcohol Use: Yes     Comment: ETOH abuse for 25 year, 1/2 gallon Liquor daily. admits 6 weeks course of drinking ETOH as her only intake in 2011. she states that she quits her drinnking since Feb 2012.   Physical exam Blood pressure 131/76, pulse 103, height 5\' 6"  (1.676 m), weight 147 lb (66.679 kg). General: No apparent distress alert and oriented x3 mood and affect normal Respiratory: Patient's speak in full sentences and does not  appear short of breath Skin: Warm dry intact with no signs of infection or rash Neuro: Cranial nerves II through XII are intact, neurovascularly intact in all extremities with 2+ DTRs and 2+ pulses. Back exam: On inspection no gross deformity. Patient does have a negative straight leg test bilaterally. She does have a positive Faber test on the right side. Patient does have full range of motion of the lumbar spine. She is neurovascularly intact distally with 2+ DTRs

## 2013-02-20 NOTE — Patient Instructions (Signed)
Very nice to meet you. I think you do have mechanical low back pain but may be a nerve root impingement. I want you to try the medications and continue. prednisone one time daily for the next 5 days. Meloxicam daily for the next 10 days then as needed thereafter. Tramadol as you need it. Physical therapy will be calling you to schedule an appointment.  Come back and see me again in 6 weeks.

## 2013-02-20 NOTE — Assessment & Plan Note (Signed)
Review patient's x-rays back in 2012 they do show degenerative disc disease at multiple levels of the lumbar spine. Patient's history it is concerning for potential radiculopathy secondary to an S1 nerve root impingement but on physical exam today she seems to be doing well. We will treat with prednisone, meloxicam and tramadol for pain relief and anti-inflammatories. Formal physical therapy will be started on patient was given a handout on home exercises. Patient will try these interventions and come back again in 4-6 weeks. At that time if she continues to have pain we will consider further imaging studies.

## 2013-02-21 ENCOUNTER — Encounter: Payer: Self-pay | Admitting: *Deleted

## 2013-02-27 ENCOUNTER — Encounter: Payer: Self-pay | Admitting: Internal Medicine

## 2013-03-01 ENCOUNTER — Ambulatory Visit: Payer: Medicaid Other | Attending: Family Medicine | Admitting: Physical Therapy

## 2013-03-01 ENCOUNTER — Other Ambulatory Visit: Payer: Self-pay | Admitting: Internal Medicine

## 2013-03-01 DIAGNOSIS — IMO0001 Reserved for inherently not codable concepts without codable children: Secondary | ICD-10-CM | POA: Insufficient documentation

## 2013-03-01 DIAGNOSIS — M545 Low back pain, unspecified: Secondary | ICD-10-CM | POA: Insufficient documentation

## 2013-03-01 DIAGNOSIS — R293 Abnormal posture: Secondary | ICD-10-CM | POA: Insufficient documentation

## 2013-03-01 MED ORDER — CYCLOBENZAPRINE HCL 5 MG PO TABS: ORAL_TABLET | ORAL | Status: DC

## 2013-03-06 ENCOUNTER — Other Ambulatory Visit: Payer: Self-pay | Admitting: Internal Medicine

## 2013-03-06 ENCOUNTER — Other Ambulatory Visit: Payer: Self-pay | Admitting: *Deleted

## 2013-03-06 DIAGNOSIS — G8929 Other chronic pain: Secondary | ICD-10-CM

## 2013-03-06 NOTE — Telephone Encounter (Signed)
MYCHART REQUEST

## 2013-03-08 ENCOUNTER — Ambulatory Visit: Payer: Medicaid Other | Admitting: Physical Therapy

## 2013-03-13 ENCOUNTER — Encounter: Payer: Self-pay | Admitting: Internal Medicine

## 2013-03-15 ENCOUNTER — Encounter (HOSPITAL_COMMUNITY): Payer: Self-pay | Admitting: Emergency Medicine

## 2013-03-15 ENCOUNTER — Emergency Department (HOSPITAL_COMMUNITY): Payer: Medicaid Other

## 2013-03-15 ENCOUNTER — Emergency Department (HOSPITAL_COMMUNITY)
Admission: EM | Admit: 2013-03-15 | Discharge: 2013-03-15 | Disposition: A | Discharge status: Home / Self Care | Payer: Medicaid Other | Attending: Emergency Medicine | Admitting: Emergency Medicine

## 2013-03-15 ENCOUNTER — Ambulatory Visit: Payer: Medicaid Other | Admitting: Physical Therapy

## 2013-03-15 DIAGNOSIS — F17200 Nicotine dependence, unspecified, uncomplicated: Secondary | ICD-10-CM | POA: Insufficient documentation

## 2013-03-15 DIAGNOSIS — Z8719 Personal history of other diseases of the digestive system: Secondary | ICD-10-CM | POA: Insufficient documentation

## 2013-03-15 DIAGNOSIS — F319 Bipolar disorder, unspecified: Secondary | ICD-10-CM | POA: Insufficient documentation

## 2013-03-15 DIAGNOSIS — Y939 Activity, unspecified: Secondary | ICD-10-CM | POA: Insufficient documentation

## 2013-03-15 DIAGNOSIS — S92309A Fracture of unspecified metatarsal bone(s), unspecified foot, initial encounter for closed fracture: Secondary | ICD-10-CM | POA: Insufficient documentation

## 2013-03-15 DIAGNOSIS — G8929 Other chronic pain: Secondary | ICD-10-CM

## 2013-03-15 DIAGNOSIS — R296 Repeated falls: Secondary | ICD-10-CM | POA: Insufficient documentation

## 2013-03-15 DIAGNOSIS — Z8739 Personal history of other diseases of the musculoskeletal system and connective tissue: Secondary | ICD-10-CM | POA: Insufficient documentation

## 2013-03-15 DIAGNOSIS — S92301A Fracture of unspecified metatarsal bone(s), right foot, initial encounter for closed fracture: Secondary | ICD-10-CM

## 2013-03-15 DIAGNOSIS — Y929 Unspecified place or not applicable: Secondary | ICD-10-CM | POA: Insufficient documentation

## 2013-03-15 DIAGNOSIS — F1010 Alcohol abuse, uncomplicated: Secondary | ICD-10-CM | POA: Insufficient documentation

## 2013-03-15 DIAGNOSIS — Z79899 Other long term (current) drug therapy: Secondary | ICD-10-CM | POA: Insufficient documentation

## 2013-03-15 DIAGNOSIS — Z9889 Other specified postprocedural states: Secondary | ICD-10-CM | POA: Insufficient documentation

## 2013-03-15 MED ORDER — HYDROCODONE-ACETAMINOPHEN 5-325 MG PO TABS: 1.0000 | ORAL_TABLET | Freq: Four times a day (QID) | ORAL | Status: DC | PRN

## 2013-03-15 MED ORDER — HYDROCODONE-ACETAMINOPHEN 5-325 MG PO TABS
1.0000 | ORAL_TABLET | Freq: Once | ORAL | Status: AC
  Administered 2013-03-15: 1 via ORAL
  Filled 2013-03-15: qty 1

## 2013-03-15 NOTE — ED Provider Notes (Signed)
History     CSN: [id number]  Arrival date & time 03/15/13  1103   First MD Initiated Contact with Patient 03/15/13 1108      Chief Complaint  Patient presents with  . Foot Injury    (Consider location/radiation/quality/duration/timing/severity/associated sxs/prior treatment) Patient is a 57 y.o. female presenting with foot injury. The history is provided by the patient. No language interpreter was used.  Foot Injury Location:  Foot Time since incident:  1 day Injury: yes   Mechanism of injury comment:  Foot inversion Foot location:  R foot and dorsum of R foot Pain details:    Quality:  Sharp and throbbing   Radiates to:  Does not radiate   Severity:  Moderate   Onset quality:  Sudden   Duration:  7 hours   Timing:  Intermittent   Progression:  Worsening Chronicity:  New Dislocation: no   Foreign body present:  No foreign bodies Prior injury to area:  No Relieved by:  Ice, NSAIDs, rest and elevation Worsened by:  Bearing weight Associated symptoms: no back pain, no fever and no numbness       Past Medical History  Diagnosis Date  . Alcohol abuse last February 2012  . Drug abuse last 1990's  . Arthritis   . Collagenous colitis   . Bipolar disorder     Past Surgical History  Procedure Laterality Date  . Nose surgery    . Abdominal hysterectomy  Age 82 years ago    partial   . Tonsillectomy  As a child   . Wrist surgery    . Foot surgery      Family History  Problem Relation Age of Onset  . Diabetes Father   . Hypertension Father   . Hyperlipidemia Father   . Stroke Mother     History  Substance Use Topics  . Smoking status: Current Every Day Smoker -- 0.50 packs/day    Types: Cigars  . Smokeless tobacco: Never Used     Comment: smoking cig for 35 years. continues to smoke 1/2 pack daily  . Alcohol Use: Yes     Comment: ETOH abuse for 25 year, 1/2 gallon Liquor daily. admits 6 weeks course of drinking ETOH as her only intake in 2011. she states  that she quits her drinnking since Feb 2012.    OB History   Grav Para Term Preterm Abortions TAB SAB Ect Mult Living   3 0   3 1 2  0 0 0     Obstetric Comments   Miscarriage due to drug abuse.      Review of Systems  Constitutional: Negative for fever.  Musculoskeletal: Negative for back pain.  Skin: Negative for rash and wound.  Neurological: Negative for numbness.    Allergies  Erythrocin and Sulfa antibiotics  Home Medications   Current Outpatient Rx  Name  Route  Sig  Dispense  Refill  . calcium citrate-vitamin D (CITRACAL+D) 315-200 MG-UNIT per tablet   Oral   Take 1 tablet by mouth 2 (two) times daily. She takes Calcium Vitamin D 600-800 1 tab BID         . cyclobenzaprine (FLEXERIL) 5 MG tablet      Take one tab at 7am, one at 2 Pm  and two tablets at bedtime as needed.   120 tablet   0   . HYDROcodone-acetaminophen (NORCO/VICODIN) 5-325 MG per tablet   Oral   Take 1 tablet by mouth every 6 (six) hours as needed  for pain.   90 tablet   0   . meloxicam (MOBIC) 15 MG tablet      One tablet daily for 10 days then as needed thereafter.   30 tablet   2   . mirtazapine (REMERON) 15 MG tablet   Oral   Take 1 tablet (15 mg total) by mouth at bedtime.   30 tablet   1   . predniSONE (DELTASONE) 50 MG tablet      One tablet daily for 5 days.   5 tablet   0   . traMADol (ULTRAM) 50 MG tablet   Oral   Take 1 tablet (50 mg total) by mouth every 6 (six) hours as needed for pain.   50 tablet   2   . varenicline (CHANTIX) 1 MG tablet   Oral   Take 1 tablet (1 mg total) by mouth 2 (two) times daily.   60 tablet   2     Start 0.5 mg po daily x 3 days, then 0.5mg  po BID  .Marland KitchenMarland Kitchen     There were no vitals taken for this visit.  Physical Exam  Nursing note and vitals reviewed. Constitutional: She appears well-developed and well-nourished. No distress.  HENT:  Head: Atraumatic.  Eyes: Conjunctivae are normal.  Neck: Neck supple.  Musculoskeletal:  She exhibits no edema.       Right knee: Normal.       Right ankle: Normal.       Right foot: She exhibits decreased range of motion, tenderness, bony tenderness and swelling. She exhibits normal capillary refill, no crepitus, no deformity and no laceration.       Feet:  Neurological: She is alert.  Skin: Skin is warm. No rash noted.    ED Course  Procedures (including critical care time)  12:12 PM Pt has mechanical fall, with R foot inversion.  Xray demonstrates a nondisplaced fx at the base of the fifth metatarsal which is consistent with pt's point tenderness.  Pt has cam walker at home and prefers using it.  Will apply ace wrap, RICE, and referral to ortho for further care.  All questions answer to pt's satisfaction.    Labs Reviewed - No data to display Dg Foot Complete Right  03/15/2013  *RADIOLOGY REPORT*  Clinical Data: Metatarsal pain.Fall yesterday.  RIGHT FOOT COMPLETE - 3+ VIEW  Comparison: None.  Findings: Prior fixation at the base of the first metatarsal.  Mild osteopenia.  Nondisplaced transverse fracture at the base of the fifth metatarsal.  Subtle irregularity at the base the fourth metatarsal is favored to be within normal variation.  Mild pes planus deformity.  Mild dorsal forefoot soft tissue swelling.  IMPRESSION: Nondisplaced fracture at the base of the fifth metatarsal.  Mild osseous irregularity at the base of the fourth metatarsal is favored to be within normal variation.  Correlate with point tenderness.   Original Report Authenticated By: Jeronimo Greaves, M.D.      1. Fracture of fifth metatarsal bone, right, closed, initial encounter   2. Chronic back pain       MDM  BP 128/73  Pulse 113  Temp(Src) 98.3 F (36.8 C) (Oral)  Resp 16  SpO2 96%  I have reviewed nursing notes and vital signs. I personally reviewed the imaging tests through PACS system  I reviewed available ER/hospitalization records thought the EMR         Fayrene Helper, PA-C 03/15/13  1213

## 2013-03-15 NOTE — ED Notes (Signed)
MD at bedside. 

## 2013-03-15 NOTE — ED Notes (Signed)
Pt presenting to ed with c/o falling last night and having swelling and pain to her right foot

## 2013-03-16 NOTE — ED Provider Notes (Signed)
Medical screening examination/treatment/procedure(s) were performed by non-physician practitioner and as supervising physician I was immediately available for consultation/collaboration.    Silvia Markuson R Davielle Lingelbach, MD 03/16/13 0721 

## 2013-03-19 ENCOUNTER — Encounter: Payer: Self-pay | Admitting: Internal Medicine

## 2013-03-20 ENCOUNTER — Encounter: Payer: Self-pay | Admitting: Internal Medicine

## 2013-03-20 ENCOUNTER — Ambulatory Visit (INDEPENDENT_AMBULATORY_CARE_PROVIDER_SITE_OTHER): Payer: Medicaid Other | Admitting: Internal Medicine

## 2013-03-20 VITALS — BP 123/75 | HR 99 | Temp 97.1°F

## 2013-03-20 DIAGNOSIS — M549 Dorsalgia, unspecified: Secondary | ICD-10-CM

## 2013-03-20 DIAGNOSIS — S8290XD Unspecified fracture of unspecified lower leg, subsequent encounter for closed fracture with routine healing: Secondary | ICD-10-CM

## 2013-03-20 DIAGNOSIS — G8929 Other chronic pain: Secondary | ICD-10-CM

## 2013-03-20 DIAGNOSIS — S92301A Fracture of unspecified metatarsal bone(s), right foot, initial encounter for closed fracture: Secondary | ICD-10-CM | POA: Insufficient documentation

## 2013-03-20 DIAGNOSIS — S92301D Fracture of unspecified metatarsal bone(s), right foot, subsequent encounter for fracture with routine healing: Secondary | ICD-10-CM

## 2013-03-20 DIAGNOSIS — R269 Unspecified abnormalities of gait and mobility: Secondary | ICD-10-CM | POA: Insufficient documentation

## 2013-03-20 MED ORDER — HYDROCODONE-ACETAMINOPHEN 5-325 MG PO TABS: 1.0000 | ORAL_TABLET | Freq: Four times a day (QID) | ORAL | Status: DC | PRN

## 2013-03-20 NOTE — Progress Notes (Signed)
   Patient: Monique Perry   MRN: [id number]  DOB: [birth year]  PCP: Dede Query, MD   Subjective:    HPI: Ms. Monique Perry is a 57 y.o. female with a PMHx as outlined below, who presented to clinic today for the following:  1) Right foot pain / injury - patient sustained a right foot injury on March 14, 2013. Mechanism of injury: twisted foot on a curb and fell. Immediate symptoms: immediate pain, delayed swelling. She was seen in the ED on 03/15/2013 for this issue with imaging at that time showing nondisplaced fracture at the base of the fifth metatarsal. She was prescribed Vicodin #15. However, pt called the clinic requesting additional medication.   Today, she indicates that the symptoms have been constant since that time. Localized primarily at the ankle. Prior history of related problems: no prior problems with this area in the past, previous foot/ankle injury - cannot clearly explain, but states she has screws in her foot because of her bones "floating".  No fevers, chills, N/V/D/C, eating and drinking well. She is not having worsening redness, swelling, joint displacement, open sores. Able to ambulate without difficulty and is wearing a CAM walker.    Review of Systems: Per HPI.    Current Outpatient Medications: Medication Sig  . calcium citrate-vitamin D (CITRACAL+D) 315-200 MG-UNIT per tablet Take 1 tablet by mouth 2 (two) times daily. She takes Calcium Vitamin D 600-800 1 tab BID  . cyclobenzaprine (FLEXERIL) 5 MG tablet Take one tab at 7am, one at 2 Pm  and two tablets at bedtime as needed.  . meloxicam (MOBIC) 15 MG tablet Take 15 mg by mouth daily as needed. One tablet daily for 10 days then as needed thereafter.  . varenicline (CHANTIX) 1 MG tablet Take 1 tablet (1 mg total) by mouth 2 (two) times daily.  Marland Kitchen HYDROcodone-acetaminophen (NORCO/VICODIN) 5-325 MG per tablet Take 1 tablet by mouth every 6 (six) hours as needed for pain.    Allergies: Allergies  Allergen Reactions  .  Erythrocin Other (See Comments)    Thrush  . Sulfa Antibiotics Other (See Comments)    Thrush     Past Medical History  Diagnosis Date  . Alcohol abuse last February 2012  . Drug abuse last 1990's  . Arthritis   . Collagenous colitis   . Bipolar disorder     Objective:    Physical Exam: Filed Vitals:   03/20/13 0812  BP: 123/75  Pulse: 99  Temp: 97.1 F (36.2 C)     General: Vital signs reviewed and noted. Well-developed, well-nourished, in no acute distress; alert, appropriate and cooperative throughout examination.  Head: Normocephalic, atraumatic.  Lungs:  Normal respiratory effort. Clear to auscultation BL without crackles or wheezes.  Heart: RRR. S1 and S2 normal without gallop, rubs. No murmur.  Abdomen:  BS normoactive. Soft, Nondistended, non-tender.  No masses or organomegaly.  Extremities: No pretibial edema. Right forefoot with bruising over 3-5 metatarsals. Mild swelling of lateral foot near ankle. Tenderness to palpation over base of 5th metatarsal.   Assessment/ Plan:   The patient's case and plan of care was discussed with attending physician, Dr. Margarito Liner.

## 2013-03-20 NOTE — Assessment & Plan Note (Signed)
Pertinent Data: XR Right foot (03/15/2013) - Nondisplaced fracture at the base of the fifth metatarsal. Mild osseous irregularity at the base of the fourth metatarsal is favored to be within normal variation.   Assessment: Patient was found to have a nondisplaced fracture secondary to mechanical injury. She is using her CAM walker and pain is stable but persistent. As the fracture is nondisplaced, per up-to-date can defer the orthopedic referral at this point, unless clinically worsening. However, the patient should continue with her non-weightbearing activities, avoiding trauma to the involved foot. As well, she should have close followup for reevaluation in the 2-3 week time frame, which is already scheduled with her sports medicine physician.  Plan:      Continue nonweightbearing activities.   Continue with Cam Walker boot at this time.  Refilled Vicodin 5-325 mg #45 - advised not to take with alcohol, do not drive or operate heavy machinery.   Advised to return to clinic or ER if symptoms worsen.

## 2013-03-20 NOTE — Progress Notes (Signed)
I discussed the patient with resident Dr. Kalia-Reynolds at the time of the visit, and I reviewed the history, findings, diagnosis, and treatment plans as outlined in the resident's note. I agree with the plans as outlined in her note. 

## 2013-03-20 NOTE — Patient Instructions (Addendum)
General Instructions:  Please follow-up at the clinic in 1 month, at which time we will reevaluate your foot pain - OR, please follow-up in the clinic sooner if needed.  There have been changes in your medications:  I am sending in a refill for your vicodin - You have been started on a new medication that can cause drowsiness, do not drive or operate heavy machinery . Do not take this medication with alcohol.     Follow up with your sports medicine doctor as soon as possible.  Continue with your nonweight bearing activities - try to putting weight on this foot or getting additional trauma to it.  Continue to wear your boot until you followup with Sports Medicine.  If you are having worsening pain, swelling, redness, tingling sensations - you should come in immediately for reevaluation.   If you have been started on new medication(s), and you develop symptoms concerning for allergic reaction, including, but not limited to, throat closing, tongue swelling, rash, please stop the medication immediately and call the clinic at [phone], and go to the ER.  If symptoms worsen, or new symptoms arise, please call the clinic or go to the ER.  PLEASE BRING ALL OF YOUR MEDICATIONS  IN A BAG TO YOUR NEXT APPOINTMENT   Treatment Goals:  Goals (1 Years of Data) as of 03/20/13         As of Today 03/15/13 03/15/13 02/20/13 02/19/13     Blood Pressure    . Blood Pressure < 140/90  123/75 118/61 128/73 131/76 138/78     Diet    . Have 3 meals a day           Lifestyle    . Increase physical activity          . Quit smoking / using tobacco           Result Component    . LDL CALC < 100            Progress Toward Treatment Goals:  Treatment Goal 11/23/2012  Stop smoking smoking the same amount    Self Care Goals & Plans:  Self Care Goal 03/20/2013  Manage my medications take my medicines as prescribed  Eat healthy foods eat foods that are low in salt; eat baked foods instead of fried foods;  drink diet soda or water instead of juice or soda  Be physically active -  Stop smoking go to the [employer] ([email or web address])       Care Management & Community Referrals:  Referral 11/23/2012  Referrals made for care management support other (see comment)

## 2013-03-22 ENCOUNTER — Encounter: Payer: Medicaid Other | Admitting: Physical Therapy

## 2013-03-29 ENCOUNTER — Other Ambulatory Visit: Payer: Self-pay | Admitting: Internal Medicine

## 2013-04-03 ENCOUNTER — Ambulatory Visit
Admission: RE | Admit: 2013-04-03 | Discharge: 2013-04-03 | Disposition: A | Discharge status: Home / Self Care | Payer: Medicaid Other | Source: Ambulatory Visit | Attending: Sports Medicine | Admitting: Sports Medicine

## 2013-04-03 ENCOUNTER — Telehealth: Payer: Self-pay | Admitting: Family Medicine

## 2013-04-03 ENCOUNTER — Ambulatory Visit (INDEPENDENT_AMBULATORY_CARE_PROVIDER_SITE_OTHER): Payer: Medicaid Other | Admitting: Family Medicine

## 2013-04-03 ENCOUNTER — Encounter: Payer: Self-pay | Admitting: Family Medicine

## 2013-04-03 VITALS — BP 136/80 | Ht 66.0 in | Wt 140.0 lb

## 2013-04-03 DIAGNOSIS — S92301A Fracture of unspecified metatarsal bone(s), right foot, initial encounter for closed fracture: Secondary | ICD-10-CM

## 2013-04-03 DIAGNOSIS — M545 Low back pain, unspecified: Secondary | ICD-10-CM

## 2013-04-03 DIAGNOSIS — M79604 Pain in right leg: Secondary | ICD-10-CM

## 2013-04-03 DIAGNOSIS — M549 Dorsalgia, unspecified: Secondary | ICD-10-CM

## 2013-04-03 DIAGNOSIS — S8290XD Unspecified fracture of unspecified lower leg, subsequent encounter for closed fracture with routine healing: Secondary | ICD-10-CM

## 2013-04-03 DIAGNOSIS — S92301D Fracture of unspecified metatarsal bone(s), right foot, subsequent encounter for fracture with routine healing: Secondary | ICD-10-CM

## 2013-04-03 DIAGNOSIS — S92309A Fracture of unspecified metatarsal bone(s), unspecified foot, initial encounter for closed fracture: Secondary | ICD-10-CM

## 2013-04-03 NOTE — Progress Notes (Signed)
Chief complaint: Recent fracture of right foot as well as followup of low back pain.  1. low back pain. Patient does have some low back pain with radiculopathy. Patient was treated with formal physical therapy, prednisone, tramadol and meloxicam. Patient states none of these made significant improvement. Patient states that actually while at physical therapy they did use the TENS unit and stated that she did not have any feeling on the right side. Patient's physical therapist was concerned for nerve root impingement and was for followup. Patient continues to have a low back pain with radiation down the right leg. Patient states that she is feeling that she has some weakness as well. There are no new symptoms from previous exam.  2. right foot fracture.  The patient was seen back on February 12, 2013 after she injured herself and had an x-ray in the emergency department. Patient at that time and did have nondisplaced fracture of the base of the fifth metatarsal. These were reviewed again today. Patient has been in a [employer] since that time. Patient states that it slowly getting better. Patient is taking Vicodin from her primary care physician.  Past medical history, social, surgical and family history all reviewed.   Physical exam Blood pressure 136/80, height 5\' 6"  (1.676 m), weight 140 lb (63.504 kg). General: No apparent distress alert and oriented x3 Back exam: On inspection no gross deformity. Patient does have a POSITIVE straight leg test on right. She does have a positive Faber test on the right side. Patient does have full range of motion of the lumbar spine. She is neurovascularly intact distally with 2+ DTRs Right foot exam shows the patient does have some mild swelling still over the base of the fifth metatarsal. She is still tender in that area. Patient is neurovascularly intact distally.

## 2013-04-03 NOTE — Assessment & Plan Note (Signed)
Patient will continue to wear the Cam Walker for another 3 weeks. I would like to get a repeat x-ray today to make sure that there is good healing. In addition to this we'll have her come back in 3 weeks and depending on symptoms we may check again.

## 2013-04-03 NOTE — Telephone Encounter (Signed)
Call the patient back with review of her foot x-ray as well as her low back x-ray. Patient will be sent for an MRI of her low back for reevaluation. Patient will follow up after the MRI and we'll discuss it further.  Will also get repeat x-ray of foot in 3 weeks. But will wait until her follow up. Told her not to get the x-ray when she has the MRI .

## 2013-04-03 NOTE — Patient Instructions (Signed)
It is good to see you again I am going to  Get x-rays of your foot and your back.  I will call you with the results.  I will likely then schedule you for an MRI.  I want to see you again in 3 weeks to make sure we can get you out of the boot.

## 2013-04-03 NOTE — Assessment & Plan Note (Signed)
I am concerned that patient likely has potential nerve root impingement. Patient's pain somewhat correlates to L3 as well as S1 there were impingement. I do think imaging is warranted. Patient will get an x-ray and once we have that time will consider potential MRI if it somewhat corresponds. Patient will continue the same pain medications as she has been getting from her primary care provider.

## 2013-04-05 ENCOUNTER — Ambulatory Visit
Admission: RE | Admit: 2013-04-05 | Discharge: 2013-04-05 | Disposition: A | Discharge status: Home / Self Care | Payer: Medicaid Other | Source: Ambulatory Visit | Attending: Sports Medicine | Admitting: Sports Medicine

## 2013-04-05 DIAGNOSIS — M549 Dorsalgia, unspecified: Secondary | ICD-10-CM

## 2013-04-06 ENCOUNTER — Other Ambulatory Visit: Payer: Medicaid Other

## 2013-04-18 ENCOUNTER — Other Ambulatory Visit: Payer: Self-pay

## 2013-04-24 ENCOUNTER — Encounter: Payer: Self-pay | Admitting: *Deleted

## 2013-04-24 ENCOUNTER — Encounter: Payer: Self-pay | Admitting: Family Medicine

## 2013-04-24 ENCOUNTER — Ambulatory Visit
Admission: RE | Admit: 2013-04-24 | Discharge: 2013-04-24 | Disposition: A | Discharge status: Home / Self Care | Payer: Medicaid Other | Source: Ambulatory Visit | Attending: Sports Medicine | Admitting: Sports Medicine

## 2013-04-24 ENCOUNTER — Ambulatory Visit (INDEPENDENT_AMBULATORY_CARE_PROVIDER_SITE_OTHER): Payer: Medicaid Other | Admitting: Family Medicine

## 2013-04-24 VITALS — BP 142/78 | Ht 66.0 in | Wt 140.0 lb

## 2013-04-24 DIAGNOSIS — M545 Low back pain, unspecified: Secondary | ICD-10-CM

## 2013-04-24 DIAGNOSIS — S8290XD Unspecified fracture of unspecified lower leg, subsequent encounter for closed fracture with routine healing: Secondary | ICD-10-CM

## 2013-04-24 DIAGNOSIS — S92301D Fracture of unspecified metatarsal bone(s), right foot, subsequent encounter for fracture with routine healing: Secondary | ICD-10-CM

## 2013-04-24 DIAGNOSIS — M549 Dorsalgia, unspecified: Secondary | ICD-10-CM

## 2013-04-24 DIAGNOSIS — M79604 Pain in right leg: Secondary | ICD-10-CM

## 2013-04-24 NOTE — Assessment & Plan Note (Signed)
Encourage patient to wear the Cam Walker on her regular basis. 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 Patient is only working 2-3 times a week for 4 hours anyhow. Would like to see patient back again in 3 weeks but we'll not reimage her foot at that time. We will consider doing an ultrasound does show callous formation. At followup as long she is doing well we'll have her wear a regular shoe.

## 2013-04-24 NOTE — Progress Notes (Signed)
Chief complaint: Low back pain and right foot pain  #1 right foot pain: Patient is here following up for a right fifth metatarsal fracture of the base. Patient states that she has been doing relatively well but has been doing a significant amount of activity other than work. Patient still states that she does have pain especially when she comes out of the boot. Patient is wearing an ASO ankle brace up when she is in a regular shoe. Patient denies any new symptoms such as radiation of pain or any numbness. Patient is not taking any pain medication on a regular basis.  Patient was sent for an x-ray of the foot which was reviewed by me today. Patient does show 1. Expected bony resorption along the pseudo Jones fracture of the fifth metatarsal base.  2. First tarsometatarsal joint attempted arthrodesis with loose hardware and probable pseudoarthrosis.  #2 low back pain. Patient has been doing the home exercises as well as taking the hydrocodone sparingly. Patient continues to take the Mobic as well as the Flexeril. Patient states that she does relatively well until she is in a specific position for a long amount of time. Patient states standing and extending her back seems to be worse. Patient has been doing yard work and thinks it is doing relatively well. Patient did have an MRI which was reviewed today. Patient's MRI shows 1. Chronic severe disc and endplate degeneration at L4-L5. No associated stenosis.  2. Chronic disc endplate degeneration and L5-S1. Multifactorial mild to moderate L5 foraminal stenosis greater on the right. Patient denies having any more radiculopathy as severe as it was previously. Patient is having some mild down the posterior box to the lateral aspect of the hip. Denies any weakness.  Physical exam Blood pressure 142/78, height 5\' 6"  (1.676 m), weight 140 lb (63.504 kg). General: No apparent distress alert and oriented x3 mood and affect normal Respiratory: Patient's speak in  full sentences and does not appear short of breath Skin: Warm dry intact with no signs of infection or rash Neuro: Cranial nerves II through XII are intact, neurovascularly intact in all extremities with 2+ DTRs and 2+ pulses. Back exam: On inspection no gross deformity. Patient does have a POSITIVE straight leg test on right. She does have a positive Faber test on the right side. Patient does have full range of motion of the lumbar spine. She is neurovascularly intact distally with 2+ DTRs  Right foot exam shows the patient does have traceswelling still over the base of the fifth metatarsal. She is still tender in that area but improved. Patient is neurovascularly intact distally.

## 2013-04-24 NOTE — Assessment & Plan Note (Signed)
The patient is making some improvement with home exercise program. Patient encouraged to continue this 3-4 times a week. In addition this patient 12 and come back and see me again in 3-4 weeks. If she ever has an exacerbation or the radiculopathy or any weakness down the right leg becomes more severe and we'll consider doing an epidural steroid injection.

## 2013-04-24 NOTE — Patient Instructions (Signed)
We will give you a note for work.  Still wear the boot when out of the house for the next 3 weeks.  Keep doing the back exercises at least 3 times a week at home. Follow up with Dr. Enos Fling for you toe. Come back and see me again in 3 weeks.

## 2013-04-25 ENCOUNTER — Encounter: Payer: Self-pay | Admitting: Internal Medicine

## 2013-04-27 ENCOUNTER — Ambulatory Visit (INDEPENDENT_AMBULATORY_CARE_PROVIDER_SITE_OTHER): Payer: Medicaid Other | Admitting: Podiatry

## 2013-04-27 ENCOUNTER — Encounter: Payer: Self-pay | Admitting: Podiatry

## 2013-04-27 VITALS — BP 139/89 | HR 85 | Ht 66.0 in | Wt 140.0 lb

## 2013-04-27 DIAGNOSIS — M25571 Pain in right ankle and joints of right foot: Secondary | ICD-10-CM

## 2013-04-27 DIAGNOSIS — S92309A Fracture of unspecified metatarsal bone(s), unspecified foot, initial encounter for closed fracture: Secondary | ICD-10-CM

## 2013-04-27 DIAGNOSIS — S92301D Fracture of unspecified metatarsal bone(s), right foot, subsequent encounter for fracture with routine healing: Secondary | ICD-10-CM

## 2013-04-27 DIAGNOSIS — M25579 Pain in unspecified ankle and joints of unspecified foot: Secondary | ICD-10-CM

## 2013-04-27 DIAGNOSIS — R609 Edema, unspecified: Secondary | ICD-10-CM

## 2013-04-27 NOTE — Progress Notes (Signed)
Subjective: 57 y.o. year old female patient presents complaining of pain on right foot at injured site, 5th Metatarsal base. Stated that she broke right foot April 2nd and went to hospital on April 3rd. She was told that she has broke bones of 5th Metatarsal base. She works about 6-7hours every two weeks. Patient stated that occasionally she has a painful spot on surgery site when she turns or move certain way. In general she feels much better about the right foot following the surgery.  The differences are night and day. It feels strong and feels like she has a firm ground to walk on. She was thinking about same surgery on the other side, then she hurt the right foot and recovering from it.   Review of Systems - General ROS: negative for - chills, fatigue, fever, hot flashes, malaise, night sweats, sleep disturbance, weight gain or weight loss Ophthalmic ROS: Changed eye sights, and has appointment next week. ENT ROS: negative Allergy and Immunology ROS: negative Endocrine ROS: negative Respiratory ROS: no cough, shortness of breath, or wheezing Cardiovascular ROS: no chest pain or dyspnea on exertion Gastrointestinal ROS: Has Collagenic colitis and watches everything she eats. Genito-Urinary ROS: no dysuria, trouble voiding, or hematuria Musculoskeletal ROS: Arthritis in hands, feet, and especially on kneed. Neurological ROS: no TIA or stroke symptoms Dermatological ROS: negative  Objective: Dermatologic:  Positive of mild edema and mild hyperemia over the injured 5th metatarsal base right. Vascular: Pedal pulses are all palpable. Orthopedic: Mild enlarged 5th metatarsal base following injury.  The first MCJ midfoot area is firm without edema or erythema.  Loading of forefoot gives good plantar flexion and ground contract at the first MPJ left foot.  Neurologic: All epicritic and tactile sensations grossly intact. Radiographic examination of the right foot reveal post injury 5th  Metatarsal base with fading radiolucency through the 5th Metatarsal base without displacement or loose bone fragments.   Post Lapidus fusion site of the first MCJ show two internal fixation screws going across the fusion site.  On AP view there is radiolucent gap across the fusion site. Lateral view is not clear of any such finding. Conclusion: Good bone healing at injured site 5th Metatarsal base. Possible incomplete fusion at attempted fusion site first MCJ right.  Assessment: 1. Good bone healing at injured 5th Metatarsal base. 2. Possible incomplete fusion of the first MCJ right without significant clinical symptoms.  Treatment: Reviewed clinical findings. Continue with her current level of treatment on right foot injury. Return if pain increase at the surgical site.

## 2013-05-10 ENCOUNTER — Ambulatory Visit (INDEPENDENT_AMBULATORY_CARE_PROVIDER_SITE_OTHER): Payer: Medicaid Other | Admitting: Internal Medicine

## 2013-05-10 ENCOUNTER — Encounter: Payer: Self-pay | Admitting: Internal Medicine

## 2013-05-10 VITALS — BP 129/75 | HR 96 | Temp 98.1°F | Ht 66.0 in | Wt 138.3 lb

## 2013-05-10 DIAGNOSIS — F1910 Other psychoactive substance abuse, uncomplicated: Secondary | ICD-10-CM

## 2013-05-10 DIAGNOSIS — E785 Hyperlipidemia, unspecified: Secondary | ICD-10-CM

## 2013-05-10 DIAGNOSIS — F32A Depression, unspecified: Secondary | ICD-10-CM

## 2013-05-10 DIAGNOSIS — M79609 Pain in unspecified limb: Secondary | ICD-10-CM

## 2013-05-10 DIAGNOSIS — R6889 Other general symptoms and signs: Secondary | ICD-10-CM

## 2013-05-10 DIAGNOSIS — M79671 Pain in right foot: Secondary | ICD-10-CM

## 2013-05-10 DIAGNOSIS — M899 Disorder of bone, unspecified: Secondary | ICD-10-CM

## 2013-05-10 DIAGNOSIS — I10 Essential (primary) hypertension: Secondary | ICD-10-CM

## 2013-05-10 DIAGNOSIS — L989 Disorder of the skin and subcutaneous tissue, unspecified: Secondary | ICD-10-CM

## 2013-05-10 DIAGNOSIS — M8580 Other specified disorders of bone density and structure, unspecified site: Secondary | ICD-10-CM

## 2013-05-10 DIAGNOSIS — F329 Major depressive disorder, single episode, unspecified: Secondary | ICD-10-CM

## 2013-05-10 DIAGNOSIS — IMO0002 Reserved for concepts with insufficient information to code with codable children: Secondary | ICD-10-CM

## 2013-05-10 LAB — BASIC METABOLIC PANEL WITH GFR
BUN: 12 mg/dL (ref 6–23)
Calcium: 10.3 mg/dL (ref 8.4–10.5)
Creat: 0.68 mg/dL (ref 0.50–1.10)
GFR, Est African American: >89 mL/min
GFR, Est Non African American: >89 mL/min
Glucose, Bld: 103 mg/dL — ABNORMAL HIGH (ref 70–99)

## 2013-05-10 LAB — LIPID PANEL
Cholesterol: 223 mg/dL — ABNORMAL HIGH (ref 0–200)
HDL: 60 mg/dL (ref >39)
Total CHOL/HDL Ratio: 3.7 Ratio
VLDL: 52 mg/dL — ABNORMAL HIGH (ref 0–40)

## 2013-05-10 MED ORDER — CYCLOBENZAPRINE HCL 5 MG PO TABS: ORAL_TABLET | ORAL | Status: DC

## 2013-05-10 NOTE — Assessment & Plan Note (Signed)
She has had a recent fracture of metatarsal right foot.  She has been on calcium and vitamin D for osteopenia.  A repeat her DEXA scan in November 2014 (checked with her insurance company and it would not allow DEXA scan repeated prior to 2 years Loraine Leriche).  - DEXA after Nov, 2014

## 2013-05-10 NOTE — Patient Instructions (Addendum)
1. Will schedule your ABI and carotid doppler 2. Continue to follow up with your surgeon 3. Stop smoking.

## 2013-05-10 NOTE — Progress Notes (Signed)
Subjective:   Patient ID: Monique Perry female   DOB: 1955-12-17 57 y.o.   MRN: [id number]  HPI: [name] is a 57 y.o. woman with PMH of polysubstance abuse, anxiety/depressionchronic back pain, sleep depravation, collagenous colitis followed by GI Dr. Elnoria Howard, who presents to the clinic for follow up visit.        # right foot pain s/p fusion surgery of right foot.    She has followed with her Podiatrist Dr. Raynald Kemp and sports medicine Dr. Katrinka Blazing. No surgery needed. She was given pain medication (tramadol).    # Claudication of lower extremity--ABI pending   Patient has had symptoms for several years and never told any physician before.  I ordered ABI during last OV and it was not done. I will have my nurse to reschedule it.   # carotid bruit--Carotid doppler pending Patient is asymptomatic.  #. Tobacco abuse     She does not want to stop smoking now. She is offered free patch and does not want to use it now.  Risk of Tobacco abuse is discussed including but not limiting cancer, cardiovascular disease and pulmonary disease.      Past Medical History  Diagnosis Date  . Alcohol abuse last February 2012  . Drug abuse last 1990's  . Arthritis   . Collagenous colitis   . Bipolar disorder    Current Outpatient Prescriptions  Medication Sig Dispense Refill  . calcium citrate-vitamin D (CITRACAL+D) 315-200 MG-UNIT per tablet Take 1 tablet by mouth 2 (two) times daily. She takes Calcium Vitamin D 600-800 1 tab BID      . cyclobenzaprine (FLEXERIL) 5 MG tablet TAKE 1 TABLET BY MOUTH AT 7AM, 2PM, AND 2 TABLETS AT BEDTIME AS NEEDED  120 tablet  0  . HYDROcodone-acetaminophen (NORCO/VICODIN) 5-325 MG per tablet Take 1 tablet by mouth every 6 (six) hours as needed for pain.  45 tablet  0  . meloxicam (MOBIC) 15 MG tablet Take 15 mg by mouth daily as needed. One tablet daily for 10 days then as needed thereafter.      . varenicline (CHANTIX) 1 MG tablet Take 1 tablet (1 mg total) by mouth  2 (two) times daily.  60 tablet  2   No current facility-administered medications for this visit.   Family History  Problem Relation Age of Onset  . Diabetes Father   . Hypertension Father   . Hyperlipidemia Father   . Stroke Mother    History   Social History  . Marital Status: Single    Spouse Name: N/A    Number of Children: N/A  . Years of Education: N/A   Social History Main Topics  . Smoking status: Current Every Day Smoker -- 0.25 packs/day    Types: Cigarettes, Cigars  . Smokeless tobacco: Never Used     Comment: smoking cig for 35 years. continues to smoke 1/2 pack daily  . Alcohol Use: Yes     Comment: ETOH abuse for 25 year, 1/2 gallon Liquor daily. admits 6 weeks course of drinking ETOH as her only intake in 2011. she states that she quits her drinnking since Feb 2012.  . Drug Use: No     Comment: pots before, clean since 1995  . Sexually Active: No   Other Topics Concern  . None   Social History Narrative   She lives with his stepson in [place]. Moved from [place] 2 years ago. Both  Parents lives in [place]. 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 Currently working at the [employer] ( [employer]). Did not complete High School .       She admits only 2-3 hours daily for 15 years. She does not sleep at nighttime. She would watch TV and read at night time. Always sleep during the day. She has seen a psychiatrist at [facility] and still goes to the group therapy on Mondays. She has an follow up appt with her Psychiatrist on 08/07/12.   Review of Systems: Negative except for s/s mentioned in HPI.   Objective:  Physical Exam: Filed Vitals:   05/10/13 1311  BP: 129/75  Pulse: 96  Temp: 98.1 F (36.7 C)  TempSrc: Oral  Height: 5\' 6"  (1.676 m)  Weight: 138 lb 4.8 oz (62.732 kg)  SpO2: 93%   General: alert, well-developed, and cooperative to examination.  Head: normocephalic and atraumatic. Mild carotid bruit noted on right side,  Eyes: vision  grossly intact, pupils equal, pupils round, pupils reactive to light, no injection and anicteric.  Mouth: pharynx pink and moist, no erythema, and no exudates. Nose 3 mm pink skin lesion noted on left side of her nose tip.  No ulcer noted Neck: supple, full ROM, no thyromegaly, no JVD, and no carotid bruits.  Lungs: normal respiratory effort, no accessory muscle use, normal breath sounds, no crackles, and no wheezes. Heart: normal rate, regular rhythm, no murmur, no gallop, and no rub.  Abdomen: soft, non-tender, normal bowel sounds, no distention, no guarding, no rebound tenderness, no hepatomegaly, and no splenomegaly.  Msk: Brace on right foot.  Pulses: 2+ petal pulse bilaterally. Unable to detect posterial tibia pulse.  Extremities: No cyanosis, clubbing, edema Neurologic: alert & oriented X3, cranial nerves II-XII intact, strength normal in all extremities, sensation intact to light touch  Skin: turgor normal and no rashes.  Psych: Oriented X3, memory intact for recent and remote, normally interactive, good eye contact, not anxious appearing, and not depressed appearing.   Assessment & Plan:

## 2013-05-10 NOTE — Assessment & Plan Note (Addendum)
She is normotensive without any medical treatment during this office visit.  I suspect she may have whitecoat syndrome.  I will suggest her to check her blood pressure at home when rest.  If her blood pressure are consistently normal, I will remove this problem list.

## 2013-05-10 NOTE — Assessment & Plan Note (Signed)
Chronic right foot pain status post fusion surgery of right foot.  Patient has been followed up by her podiatrist Dr. Raynald Kemp and the sports medicine Dr. Katrinka Blazing as an outpatient. The current plan is to treat her with symptomatic management. No surgery is indicated at the present.  - Foot brace and pain management per Dr. Katrinka Blazing.

## 2013-05-10 NOTE — Assessment & Plan Note (Signed)
She had elevated LDL. I will repeat her lipid panel today  - likely start her on Statin if LDL is > 100

## 2013-05-11 ENCOUNTER — Other Ambulatory Visit: Payer: Self-pay | Admitting: *Deleted

## 2013-05-11 ENCOUNTER — Telehealth: Payer: Self-pay | Admitting: *Deleted

## 2013-05-11 MED ORDER — MELOXICAM 15 MG PO TABS: 15.0000 mg | ORAL_TABLET | Freq: Every day | ORAL | Status: DC

## 2013-05-11 NOTE — Telephone Encounter (Signed)
Message copied by Mora Bellman on Fri May 11, 2013 11:49 AM ------      Message from: Lizbeth Bark      Created: Fri May 11, 2013  9:54 AM      Regarding: phone message      Contact: [phone]       Pt called to states she is allergic to tramadol would like another med called in to [address] ------

## 2013-05-11 NOTE — Telephone Encounter (Signed)
Left pt a VM to get more info.

## 2013-05-11 NOTE — Progress Notes (Signed)
Case discussed with Dr. Na Li at the time of the visit, immediately after the resident saw the patient.  I reviewed the resident's history and exam and pertinent patient test results.  I agree with the assessment, diagnosis and plan of care documented in the resident's note.   

## 2013-05-14 NOTE — Telephone Encounter (Signed)
Per Dr. Katrinka Blazing advised pt that it would be fine to take 650 mg tylenol three times per day would be fine for her to take.

## 2013-05-15 ENCOUNTER — Ambulatory Visit (INDEPENDENT_AMBULATORY_CARE_PROVIDER_SITE_OTHER): Payer: Medicaid Other | Admitting: Family Medicine

## 2013-05-15 ENCOUNTER — Other Ambulatory Visit: Payer: Self-pay | Admitting: Family Medicine

## 2013-05-15 ENCOUNTER — Encounter: Payer: Self-pay | Admitting: Family Medicine

## 2013-05-15 VITALS — BP 164/76 | HR 97 | Ht 66.0 in | Wt 138.0 lb

## 2013-05-15 DIAGNOSIS — M545 Low back pain, unspecified: Secondary | ICD-10-CM

## 2013-05-15 DIAGNOSIS — M549 Dorsalgia, unspecified: Secondary | ICD-10-CM

## 2013-05-15 DIAGNOSIS — M79604 Pain in right leg: Secondary | ICD-10-CM

## 2013-05-15 MED ORDER — KETOROLAC TROMETHAMINE 60 MG/2ML IM SOLN
60.0000 mg | Freq: Once | INTRAMUSCULAR | Status: AC
  Administered 2013-05-15: 60 mg via INTRAMUSCULAR

## 2013-05-15 MED ORDER — GABAPENTIN 300 MG PO CAPS: 300.0000 mg | ORAL_CAPSULE | Freq: Three times a day (TID) | ORAL | Status: DC

## 2013-05-15 NOTE — Progress Notes (Signed)
Chief complaint: Low back pain and right foot pain  #1 right foot pain: Patient is here following up for a right fifth metatarsal fracture of the base. Patient states that she has been doing well  And wearing regular shoes.  No brace at this time. Patient denies any new symptoms such as radiation of pain or any numbness. Patient is not taking any pain medication on a regular basis for this problem.    #2 low back pain. Was doing home exercises, meloxicam but did have allergy to tramadol.  Patient states though the pain has been going down her leg much more frequently at this time.  Standing still makes it worse.   Patient's MRI shows 1 month ago shows.  1. Chronic severe disc and endplate degeneration at L4-L5. No associated stenosis.  2. Chronic disc endplate degeneration and L5-S1. Multifactorial mild to moderate L5 foraminal stenosis greater on the right. Denies any weakness or true numbness.   Physical exam Blood pressure 164/76, pulse 97, height 5\' 6"  (1.676 m), weight 138 lb (62.596 kg). General: No apparent distress alert and oriented x3 mood and affect normal Respiratory: Patient's speak in full sentences and does not appear short of breath Skin: Warm dry intact with no signs of infection or rash Neuro: Cranial nerves II through XII are intact, neurovascularly intact in all extremities with 2+ DTRs and 2+ pulses. Back exam: On inspection no gross deformity. Patient does have a POSITIVE straight leg test on right. She does have a positive Faber test on the right side. Patient does have full range of motion of the lumbar spine. She is neurovascularly intact distally with 2+ DTRs  Right foot exam shows the patient does have traceswelling still over the base of the fifth metatarsal. She is still tender in that area but improved. Patient is neurovascularly intact distally.

## 2013-05-15 NOTE — Patient Instructions (Addendum)
Good to see you as always We will start neurontin. One tablet at night for 3 days, one tablet twice a day for 3 days, then one tablet 3 times a day. I am giving you an injection. This should help the pain. We will do an epidural steroid injection. Come back and see [place] again in 3 weeks.

## 2013-05-15 NOTE — Assessment & Plan Note (Addendum)
With patient's low back pain and radiculopathy I do think patient may respond well to epidural steroid injections. Patient will be scheduled for this. In addition this patient was given some Neurontin to start and titrate up. Patient was warned of potential side effects. Patient will continue home exercises.  Patient will come back again in 3 weeks for further evaluation after epidural steroid injections focusing on the L4-L5, L5-S1 areas. Toradol given today for pain relief as well.

## 2013-05-22 ENCOUNTER — Ambulatory Visit
Admission: RE | Admit: 2013-05-22 | Discharge: 2013-05-22 | Disposition: A | Discharge status: Home / Self Care | Payer: Medicaid Other | Source: Ambulatory Visit | Attending: Sports Medicine | Admitting: Sports Medicine

## 2013-05-22 VITALS — BP 133/66 | HR 80

## 2013-05-22 DIAGNOSIS — M549 Dorsalgia, unspecified: Secondary | ICD-10-CM

## 2013-05-22 MED ORDER — IOHEXOL 180 MG/ML  SOLN
1.0000 mL | Freq: Once | INTRAMUSCULAR | Status: AC | PRN
  Administered 2013-05-22: 1 mL via EPIDURAL

## 2013-05-22 MED ORDER — METHYLPREDNISOLONE ACETATE 40 MG/ML INJ SUSP (RADIOLOG
120.0000 mg | Freq: Once | INTRAMUSCULAR | Status: AC
  Administered 2013-05-22: 120 mg via EPIDURAL

## 2013-05-23 ENCOUNTER — Telehealth: Payer: Self-pay | Admitting: *Deleted

## 2013-05-23 NOTE — Telephone Encounter (Signed)
Message copied by Jacki Cones C on Wed May 23, 2013 12:20 PM ------      Message from: CERESI, MELANIE L      Created: Wed May 23, 2013 11:08 AM      Regarding: phone message      Contact: [phone]       Pt states she is having neck pain, only thing that helps is when she is laying down with an ice pack on it. She would like a call back on what she can do to get Releaf for pain. Thanks!  ------

## 2013-05-23 NOTE — Telephone Encounter (Signed)
Pt had lumbar ESI yesterday. Per Dr. Margaretha Sheffield advised pt that she may be systemic benefit from the steroid inj that might help her neck.  Advised her to call back in 2 days if her neck pain continues for an appointment, as we have never evaluated her neck pain here in the office.  Pt agreeable.

## 2013-06-05 ENCOUNTER — Ambulatory Visit (INDEPENDENT_AMBULATORY_CARE_PROVIDER_SITE_OTHER): Payer: Medicaid Other | Admitting: Family Medicine

## 2013-06-05 ENCOUNTER — Encounter: Payer: Self-pay | Admitting: Family Medicine

## 2013-06-05 VITALS — BP 151/91 | HR 103 | Ht 68.0 in | Wt 138.0 lb

## 2013-06-05 DIAGNOSIS — M79604 Pain in right leg: Secondary | ICD-10-CM

## 2013-06-05 DIAGNOSIS — M545 Low back pain, unspecified: Secondary | ICD-10-CM

## 2013-06-05 NOTE — Assessment & Plan Note (Signed)
Patient did respond very well to epidural steroid injection. Patient is encouraged to continue the home exercise program, a home TENS unit, as well as medications prescribed. Patient knows that we can do a series of 3 epidural steroid injections if for some reason his pain seemed to worsen. Patient at this time will followup on an as-needed basis, if symptoms worsen would encourage patient to try another epidural steroid injection.

## 2013-06-05 NOTE — Progress Notes (Signed)
Followup for low back pain  History of present illness: Patient is a past medical history significant for a degenerative disc disease of L4-L5 as well as L5-S1 with moderate L5 foraminal stenosis greater on the right. Patient was sent for an epidural steroid injection. Patient states at this did improve her pain by about 70-80%. Patient states that she is doing all activities of daily living and resting much more comfortably. Patient continues to take the Neurontin as well as the flexor a fairly regularly and the meloxicam as needed. Patient states that this has made world of difference. Patient states that her back has not felt this good in quite some time and states that the radiculopathy has almost entirely resolved. Patient is very happy with the results.  No new symptoms  Physical exam Blood pressure 151/91, pulse 103, height 5\' 8"  (1.727 m), weight 138 lb (62.596 kg). General: No apparent distress alert and oriented x3 mood and affect normal Respiratory: Patient's speak in full sentences and does not appear short of breath Skin: Warm dry intact with no signs of infection or rash Neuro: Cranial nerves II through XII are intact, neurovascularly intact in all extremities with 2+ DTRs and 2+ pulses. Back exam: On inspection no gross deformity. Patient has a negative straight leg test on the right. Patient's has a positive Faber test on the right. Patient has full range of motion of the lumbar spine is neurovascularly intact distally.

## 2013-06-18 ENCOUNTER — Ambulatory Visit (INDEPENDENT_AMBULATORY_CARE_PROVIDER_SITE_OTHER): Payer: Medicaid Other | Admitting: Internal Medicine

## 2013-06-18 ENCOUNTER — Encounter: Payer: Self-pay | Admitting: Internal Medicine

## 2013-06-18 VITALS — BP 129/79 | HR 95 | Temp 97.6°F | Wt 140.5 lb

## 2013-06-18 DIAGNOSIS — G8929 Other chronic pain: Secondary | ICD-10-CM

## 2013-06-18 DIAGNOSIS — M25561 Pain in right knee: Secondary | ICD-10-CM

## 2013-06-18 DIAGNOSIS — M549 Dorsalgia, unspecified: Secondary | ICD-10-CM

## 2013-06-18 DIAGNOSIS — M545 Low back pain, unspecified: Secondary | ICD-10-CM

## 2013-06-18 DIAGNOSIS — M79604 Pain in right leg: Secondary | ICD-10-CM

## 2013-06-18 DIAGNOSIS — M25569 Pain in unspecified knee: Secondary | ICD-10-CM | POA: Insufficient documentation

## 2013-06-18 MED ORDER — CYCLOBENZAPRINE HCL 5 MG PO TABS: 10.0000 mg | ORAL_TABLET | Freq: Three times a day (TID) | ORAL | Status: DC | PRN

## 2013-06-18 MED ORDER — KETOROLAC TROMETHAMINE 60 MG/2ML IM SOLN
60.0000 mg | Freq: Once | INTRAMUSCULAR | Status: AC
  Administered 2013-06-18: 60 mg via INTRAMUSCULAR

## 2013-06-18 NOTE — Assessment & Plan Note (Addendum)
Assessment:  The patient's left sided thoracic/shoudler back pain is likely secondary to myofascial pain.   Plan:  Increase flexeril to 10 mg TID. Patient has an appointment to f/u with sports medicine on 06/26/13.

## 2013-06-18 NOTE — Patient Instructions (Addendum)
Increase flexeril to 10 mg three times a day.  Follow-up with sports medicine on 06/26/13.

## 2013-06-18 NOTE — Assessment & Plan Note (Addendum)
Assessment:  The patient's right knee pain is likely secondary to myofascial pain.  Plan:  Increase flexeril to 10 mg TID. Patient has an appointment to f/u with sports medicine on 06/26/13.

## 2013-06-18 NOTE — Progress Notes (Signed)
Subjective:   Patient ID: Monique Perry female   DOB: 11/10/1956 57 y.o.   MRN: [id number]  HPI: [name] is a 57 y.o. female with a pmhx of chronic LBP, radicular pain, and myofascial pain who comes to the clinic today with an acute flare of her chronic pain.  The patient has been under the care of Dr. Katrinka Blazing who is a sports medicine specialist. Of note, the patient responded well to an [facility] in June. The patient states that she was told to contact her physician if the pain returned because she may need repeat injections.  Three days ago, the patient woke and noticed that she had severe right sided knee pain. The pain radiated up and down her leg. The pain is similar to pain she had before her ESI.  The pain was made better with ice, flexeril, meloxicam, and gabapentin.  However, the patient states that her pain is not fully controlled with this regimen. Of note, the patient contacted her sports medicine physician who will see her next Tuesday.    Past Medical History  Diagnosis Date  . Alcohol abuse last February 2012  . Drug abuse last 1990's  . Arthritis   . Collagenous colitis   . Bipolar disorder    Current Outpatient Prescriptions  Medication Sig Dispense Refill  . cyclobenzaprine (FLEXERIL) 5 MG tablet TAKE 1 TABLET BY MOUTH AT 7AM, 2PM, AND 2 TABLETS AT BEDTIME AS NEEDED  120 tablet  0  . gabapentin (NEURONTIN) 300 MG capsule Take 1 capsule (300 mg total) by mouth 3 (three) times daily.  90 capsule  1  . meloxicam (MOBIC) 15 MG tablet Take 1 tablet (15 mg total) by mouth daily.  30 tablet  0  . varenicline (CHANTIX) 1 MG tablet Take 1 tablet (1 mg total) by mouth 2 (two) times daily.  60 tablet  2  . calcium citrate-vitamin D (CITRACAL+D) 315-200 MG-UNIT per tablet Take 1 tablet by mouth 2 (two) times daily. She takes Calcium Vitamin D 600-800 1 tab BID       No current facility-administered medications for this visit.   Family History  Problem Relation Age of Onset  .  Diabetes Father   . Hypertension Father   . Hyperlipidemia Father   . Stroke Mother    History   Social History  . Marital Status: Single    Spouse Name: N/A    Number of Children: N/A  . Years of Education: N/A   Social History Main Topics  . Smoking status: Former Smoker -- 0.25 packs/day    Types: Cigarettes, Cigars  . Smokeless tobacco: Never Used     Comment: smoking cig for 35 years. continues to smoke 1/2 pack daily  . Alcohol Use: Yes     Comment: ETOH abuse for 25 year, 1/2 gallon Liquor daily. admits 6 weeks course of drinking ETOH as her only intake in 2011. she states that she quits her drinnking since Feb 2012.  . Drug Use: No     Comment: pots before, clean since 1995  . Sexually Active: No   Other Topics Concern  . None   Social History Narrative   She lives with his stepson in [place]. Moved from [place] 2 years ago. Both  Parents lives in [place]. 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 Did not complete High School .       She admits only 2-3 hours daily for 15  years. She does not sleep at nighttime. She would watch TV and read at night time. Always sleep during the day. She has seen a psychiatrist at [facility] and still goes to the group therapy on Mondays. She has an follow up appt with her Psychiatrist on 08/07/12.   Review of Systems:  Pertinent items are noted in HPI.   Objective:  Physical Exam: Filed Vitals:   06/18/13 1505  BP: 129/79  Pulse: 95  Temp: 97.6 F (36.4 C)  TempSrc: Oral  Weight: 140 lb 8 oz (63.73 kg)  SpO2: 95%   Physical Exam  Constitutional: She is oriented to person, place, and time. She appears well-developed and well-nourished. No distress.  HENT:  Head: Normocephalic and atraumatic.  Nose: Nose normal.  Eyes: EOM are normal. Pupils are equal, round, and reactive to light.  Neck: No JVD present. No tracheal deviation present. No thyromegaly present.    Cardiovascular: Normal rate, regular rhythm, normal heart sounds and intact distal pulses.  Exam reveals no gallop and no friction rub.   No murmur heard. Pulmonary/Chest: Breath sounds normal. No respiratory distress. She has no wheezes. She has no rales. She exhibits no tenderness.  Abdominal: Soft. Bowel sounds are normal. She exhibits no distension. There is no tenderness.  Musculoskeletal:       Right knee: She exhibits normal range of motion, no swelling, no effusion, no ecchymosis, no deformity, no laceration, no erythema and normal patellar mobility. Tenderness found.       Cervical back: She exhibits decreased range of motion, tenderness, pain and spasm. She exhibits no bony tenderness, no swelling, no edema, no deformity and no laceration.       Lumbar back: She exhibits tenderness, bony tenderness and pain.       Back:  Increased muscle tone tender to palpation along the left trapezius above the scapula and below the occiput. The pain is tender to palpation above the superior iliac spine.  The patient is tender to palpation in the right quadriceps just superior to the knee.  Neurological: She is alert and oriented to person, place, and time.  Skin: She is not diaphoretic.  Psychiatric: She has a normal mood and affect. Her behavior is normal.     Assessment & Plan:

## 2013-06-18 NOTE — Assessment & Plan Note (Addendum)
Assessment:  The patient's lumbar back pain is likely secondary to radiculopathy.  Plan:  Patient reports that her pain is doing much better since ESI, but has flared up again.  We recommended that the patient receive IM Toradol as it was effective in the past. The patient has an appointment to f/u with sports medicine on 06/26/13.

## 2013-06-20 NOTE — Progress Notes (Signed)
I saw and evaluated the patient.  I personally confirmed the key portions of the history and exam documented by Dr. Komanski and I reviewed pertinent patient test results.  The assessment, diagnosis, and plan were formulated together and I agree with the documentation in the resident's note.  

## 2013-07-03 ENCOUNTER — Ambulatory Visit (INDEPENDENT_AMBULATORY_CARE_PROVIDER_SITE_OTHER): Payer: Medicaid Other | Admitting: Family Medicine

## 2013-07-03 VITALS — BP 126/80 | Ht 68.0 in | Wt 138.0 lb

## 2013-07-03 DIAGNOSIS — M545 Low back pain, unspecified: Secondary | ICD-10-CM

## 2013-07-03 DIAGNOSIS — M5416 Radiculopathy, lumbar region: Secondary | ICD-10-CM

## 2013-07-03 DIAGNOSIS — M79604 Pain in right leg: Secondary | ICD-10-CM

## 2013-07-03 DIAGNOSIS — IMO0002 Reserved for concepts with insufficient information to code with codable children: Secondary | ICD-10-CM

## 2013-07-03 MED ORDER — GABAPENTIN 300 MG PO CAPS: 600.0000 mg | ORAL_CAPSULE | Freq: Three times a day (TID) | ORAL | Status: DC

## 2013-07-03 MED ORDER — CYCLOBENZAPRINE HCL 5 MG PO TABS: 10.0000 mg | ORAL_TABLET | Freq: Three times a day (TID) | ORAL | Status: DC | PRN

## 2013-07-03 MED ORDER — MELOXICAM 15 MG PO TABS: 15.0000 mg | ORAL_TABLET | Freq: Every day | ORAL | Status: DC

## 2013-07-03 NOTE — Patient Instructions (Addendum)
The pain in your neck is muscle spasm. Use heat for 20 min 3 x per day. Work on stretching. Consider massage. Use meloxicam and flexeril.  For your back and leg pain, this is a pinched nerve. We will send you for another steroid injection. Increase your gabapentin to 600 mg 3 x per day per schedule below:  300 mg in morning, 300 mg at noon, and 600 mg at night for 4-5 days 600 mg in morning, 300 mg at noon, and 600 mg at night for 4-5 days Then 600 mg 3x per day.

## 2013-07-03 NOTE — Progress Notes (Signed)
CC: Low back pain and right leg pain HPI: Patient is a 1 are old female who presents for followup of low back pain with right-sided radiculopathy. Her other complaint today is with regards to left-sided neck pain. She says she has a burning pain that radiates from the left side of her neck to the top of her shoulder. No pain past the shoulder joint. No radicular symptoms or paresthesias.  With regards to her radicular pain in her leg she has previously completed physical therapy as well as anti-inflammatories, heat, ice, and gabapentin. Last month she received an epidural steroid injection which provided about 3 weeks of pain relief. However her pain flared again and she now has recurrence of radicular pain and paresthesias. She is currently taking gabapentin 300 mg 3 times a day as well as Flexeril 10 mg 3 times a day. Sometimes she feels like her knee is going to give out.  ROS: As above in the HPI. All other systems are stable or negative.  OBJECTIVE: APPEARANCE:  Patient in no acute distress.The patient appeared well nourished and normally developed. HEENT: No scleral icterus. Conjunctiva non-injected Resp: Non labored Skin: No rash MSK:  Back Exam: - FROM in flexion, extension, lateral bending, rotation - TTP at midline L4-L5 spinous processes - No TTP in low back musculature, SI joint, sciatic notch - SLR is positive - Strength 5/5 bilaterally in lower legs - Reflexes 2+ bilaterally at knee and achilles - FABERs negative Right Knee - Inspection normal with no erythema or effusion or obvious bony abnormalities.  - Palpation normal with no warmth or joint line tenderness. No TTP at patellar or quad tendon.  - ROM normal in flexion and extension. - Strength 5/5 in flexion and extension. - Ligaments with solid consistent endpoints including ACL, PCL, LCL, MCL.  - Negative Mcmurray's.   - Neurovascularly intact  ASSESSMENT: #1. Neck pain due to trapezius and scalene spasm #2.  Right-sided lumbar radiculopathy   PLAN: #1. For neck pain, recommended muscle relaxer and NSAID therapy along with heat, stretching, and massage therapy. #2. The patient had significant improvement in her radiculopathy with epidural steroid injection. At this time, I think he would be appropriate to repeat that injection. That referral was placed today. We will also increase the patient's gabapentin by gradual titration scale up to 600 mg 3 times a day. Unfortunately, she has completed all the physical therapy visits for the year but have recommended that she continue her home exercises. She will follow up with me in about a month after her epidural steroid injection.

## 2013-07-04 ENCOUNTER — Other Ambulatory Visit: Payer: Self-pay | Admitting: Family Medicine

## 2013-07-04 DIAGNOSIS — M5416 Radiculopathy, lumbar region: Secondary | ICD-10-CM

## 2013-07-11 ENCOUNTER — Ambulatory Visit
Admission: RE | Admit: 2013-07-11 | Discharge: 2013-07-11 | Disposition: A | Discharge status: Home / Self Care | Payer: Medicaid Other | Source: Ambulatory Visit | Attending: Family Medicine | Admitting: Family Medicine

## 2013-07-11 VITALS — BP 143/73 | HR 100

## 2013-07-11 DIAGNOSIS — M5416 Radiculopathy, lumbar region: Secondary | ICD-10-CM

## 2013-07-11 MED ORDER — IOHEXOL 180 MG/ML  SOLN
1.0000 mL | Freq: Once | INTRAMUSCULAR | Status: AC | PRN
  Administered 2013-07-11: 1 mL via EPIDURAL

## 2013-07-11 MED ORDER — METHYLPREDNISOLONE ACETATE 40 MG/ML INJ SUSP (RADIOLOG
120.0000 mg | Freq: Once | INTRAMUSCULAR | Status: AC
  Administered 2013-07-11: 120 mg via EPIDURAL

## 2013-07-31 ENCOUNTER — Ambulatory Visit (INDEPENDENT_AMBULATORY_CARE_PROVIDER_SITE_OTHER): Payer: Medicaid Other | Admitting: Family Medicine

## 2013-07-31 ENCOUNTER — Ambulatory Visit
Admission: RE | Admit: 2013-07-31 | Discharge: 2013-07-31 | Disposition: A | Discharge status: Home / Self Care | Payer: Medicaid Other | Source: Ambulatory Visit | Attending: Family Medicine | Admitting: Family Medicine

## 2013-07-31 ENCOUNTER — Other Ambulatory Visit: Payer: Self-pay | Admitting: Family Medicine

## 2013-07-31 ENCOUNTER — Encounter: Payer: Self-pay | Admitting: Family Medicine

## 2013-07-31 VITALS — BP 130/85 | HR 99 | Ht 68.0 in | Wt 138.0 lb

## 2013-07-31 DIAGNOSIS — M5416 Radiculopathy, lumbar region: Secondary | ICD-10-CM

## 2013-07-31 DIAGNOSIS — M549 Dorsalgia, unspecified: Secondary | ICD-10-CM

## 2013-07-31 DIAGNOSIS — M542 Cervicalgia: Secondary | ICD-10-CM

## 2013-07-31 DIAGNOSIS — IMO0002 Reserved for concepts with insufficient information to code with codable children: Secondary | ICD-10-CM

## 2013-07-31 NOTE — Patient Instructions (Addendum)
Thank you for coming in today  For your leg pain, we will do one more injection. If this still does not give adequate symptom relief, then we will send you to a surgeon. Continue your medications - gabapentin, mobic, and flexeril For neck pain, do exercises and continue medications and ice or heat. We are going to get xrays. Return in 4 weeks.

## 2013-07-31 NOTE — Progress Notes (Signed)
CC: Followup right-sided lumbar radiculopathy and left-sided neck pain HPI: Patient is a pleasant 57 year old female who presents for followup today. I have been seeing her both for right-sided lumbar radiculopathy as well as new complaint of left-sided neck pain at last visit. Patient states that she is overall doing okay today. She had her second L4-L5 lumbar epidural steroid injection on July 30. She notes significant improvement and feels like she is 50-60% better at this time. She is also on an increased dose of gabapentin 600 mg 3 times a day as well as Flexeril 3 times per day. She continues to have pain that radiates down the back of her light into her calf. No weakness or bowel or bladder disturbance.   Unfortunately, her neck pain has not improved at all with conservative therapy including medications as above, heat, and ice. She describes a burning pain on the left side of her neck and left trapezius along with feelings of pins and needles. She denies any radicular pain down the arm, weakness in arm, or paresthesias in the arm. Unfortunately she is not able to do physical therapy due to her insurance status and the fact that she has already used her therapy sessions.  ROS: As above in the HPI. All other systems are stable or negative.  OBJECTIVE: APPEARANCE:  Patient in no acute distress.The patient appeared well nourished and normally developed. HEENT: No scleral icterus. Conjunctiva non-injected Resp: Non labored Skin: No rash MSK: Neck: Equivocal spurling's Full neck range of motion Grip strength and sensation normal in bilateral hands Strength good C4 to T1 distribution No sensory change to C4 to T1 Reflexes normal   Low back exam: - Full range of motion in flexion, extension, lateral bending, rotation without pain  - No tenderness to palpation over the spinous processes of the lumbar vertebra - No tenderness to palpation at the SI joint or sciatic notch - Negative  straight leg raise - Strength 5 out of 5 in the bilateral lower extremities - Reflexes 2+ bilaterally.  ASSESSMENT: #1. Right-sided lumbar radiculopathy. Temporary response to epidural steroid injection at L5-S1. More lasting response to epidural steroid injection at L4-L5 with approximately 50-60% improvement at this time. #2. Left-sided neck pain, nonradicular, but with neuropathic type features   PLAN: For patient's lumbar radiculopathy, happy to see that she has had a 50-60% improvement in her pain with the epidural steroid injection. Given that she has had significant effects from this we will refer her for a third epidural steroid injection. If she has persistent symptoms after this injection with unsatisfactory improvement we will send her for a surgical opinion. She will continue her gabapentin, Flexeril, meloxicam. She was given a home exercise program with the McKenzie exercises to work on back extension.  For her neck pain, will obtain cervical spine films to evaluate for degenerative change arthritis. Patient was encouraged to continue medications as above. Unfortunately she is unable to physical therapy given insurance limitations at this time. She was also given a home exercise program for her neck to do until I see her back. She will followup in 4 weeks.

## 2013-08-02 ENCOUNTER — Ambulatory Visit (INDEPENDENT_AMBULATORY_CARE_PROVIDER_SITE_OTHER): Payer: Medicaid Other | Admitting: Internal Medicine

## 2013-08-02 ENCOUNTER — Encounter: Payer: Self-pay | Admitting: Internal Medicine

## 2013-08-02 VITALS — BP 134/83 | HR 93 | Temp 97.8°F | Wt 142.5 lb

## 2013-08-02 DIAGNOSIS — M79604 Pain in right leg: Secondary | ICD-10-CM

## 2013-08-02 DIAGNOSIS — Z720 Tobacco use: Secondary | ICD-10-CM

## 2013-08-02 DIAGNOSIS — M545 Low back pain, unspecified: Secondary | ICD-10-CM

## 2013-08-02 DIAGNOSIS — F17200 Nicotine dependence, unspecified, uncomplicated: Secondary | ICD-10-CM

## 2013-08-02 NOTE — Patient Instructions (Addendum)
1. Will follow up in 3 months. 2. ABI and Carotid Doppler pending.   Back Pain, Adult Low back pain is very common. About 1 in 5 people have back pain.The cause of low back pain is rarely dangerous. The pain often gets better over time.About half of people with a sudden onset of back pain feel better in just 2 weeks. About 8 in 10 people feel better by 6 weeks.  CAUSES Some common causes of back pain include:  Strain of the muscles or ligaments supporting the spine.  Wear and tear (degeneration) of the spinal discs.  Arthritis.  Direct injury to the back. DIAGNOSIS Most of the time, the direct cause of low back pain is not known.However, back pain can be treated effectively even when the exact cause of the pain is unknown.Answering your caregiver's questions about your overall health and symptoms is one of the most accurate ways to make sure the cause of your pain is not dangerous. If your caregiver needs more information, he or she may order lab work or imaging tests (X-rays or MRIs).However, even if imaging tests show changes in your back, this usually does not require surgery. HOME CARE INSTRUCTIONS For many people, back pain returns.Since low back pain is rarely dangerous, it is often a condition that people can learn to [employer] their own.   Remain active. It is stressful on the back to sit or stand in one place. Do not sit, drive, or stand in one place for more than 30 minutes at a time. Take short walks on level surfaces as soon as pain allows.Try to increase the length of time you walk each day.  Do not stay in bed.Resting more than 1 or 2 days can delay your recovery.  Do not avoid exercise or work.Your body is made to move.It is not dangerous to be active, even though your back may hurt.Your back will likely heal faster if you return to being active before your pain is gone.  Pay attention to your body when you bend and lift. Many people have less discomfortwhen  lifting if they bend their knees, keep the load close to their bodies,and avoid twisting. Often, the most comfortable positions are those that put less stress on your recovering back.  Find a comfortable position to sleep. Use a firm mattress and lie on your side with your knees slightly bent. If you lie on your back, put a pillow under your knees.  Only take over-the-counter or prescription medicines as directed by your caregiver. Over-the-counter medicines to reduce pain and inflammation are often the most helpful.Your caregiver may prescribe muscle relaxant drugs.These medicines help dull your pain so you can more quickly return to your normal activities and healthy exercise.  Put ice on the injured area.  Put ice in a plastic bag.  Place a towel between your skin and the bag.  Leave the ice on for 15-20 minutes, 3-4 times a day for the first 2 to 3 days. After that, ice and heat may be alternated to reduce pain and spasms.  Ask your caregiver about trying back exercises and gentle massage. This may be of some benefit.  Avoid feeling anxious or stressed.Stress increases muscle tension and can worsen back pain.It is important to recognize when you are anxious or stressed and learn ways to manage it.Exercise is a great option. SEEK MEDICAL CARE IF:  You have pain that is not relieved with rest or medicine.  You have pain that does not improve in  1 week.  You have new symptoms.  You are generally not feeling well. SEEK IMMEDIATE MEDICAL CARE IF:   You have pain that radiates from your back into your legs.  You develop new bowel or bladder control problems.  You have unusual weakness or numbness in your arms or legs.  You develop nausea or vomiting.  You develop abdominal pain.  You feel faint. Document Released: 11/29/2005 Document Revised: 05/30/2012 Document Reviewed: 04/19/2011 [facility] Patient Information 2014 [place], [place].

## 2013-08-02 NOTE — Progress Notes (Signed)
Subjective:   Patient ID: Monique Perry female   DOB: [birth year] 57 y.o.   MRN: [id number]  HPI: [name] is a 57 y.o. woman with PMH of polysubstance abuse, anxiety/depression, chronic back pain followed by Dr. Margaretmary Bayley medicine [name], sleep depravation, collagenous colitis followed by GI Dr. Elnoria Howard, who presents to the clinic for follow up visit.     # Chronic back pain    She had her L4-L5 lumbar epidural steroid injections on June 10 th and July 30th 2014. She is followed by Dr. [name] who put her on gabapentin 600 mg 3 times a day, Flexeril  10 mg 3 times per day and Mobic 10 mg po daily PRN. She feels better now.    #. Tobacco abuse     She does not want to stop smoking now. She is offered free patch and does not want to use it now.  Risk of Tobacco abuse is discussed including but not limiting cancer, cardiovascular disease and pulmonary disease.   Health maintenance - ABI and carotid doppler.     Past Medical History  Diagnosis Date  . Alcohol abuse last February 2012  . Drug abuse last 1990's  . Arthritis   . Collagenous colitis   . Bipolar disorder    Current Outpatient Prescriptions  Medication Sig Dispense Refill  . calcium citrate-vitamin D (CITRACAL+D) 315-200 MG-UNIT per tablet Take 1 tablet by mouth 2 (two) times daily. She takes Calcium Vitamin D 600-800 1 tab BID      . cyclobenzaprine (FLEXERIL) 5 MG tablet Take 2 tablets (10 mg total) by mouth 3 (three) times daily as needed for muscle spasms. TAKE 1 TABLET BY MOUTH AT 7AM, 2PM, AND 2 TABLETS AT BEDTIME AS NEEDED  120 tablet  0  . gabapentin (NEURONTIN) 300 MG capsule Take 2 capsules (600 mg total) by mouth 3 (three) times daily.  90 capsule  1  . meloxicam (MOBIC) 15 MG tablet Take 15 mg by mouth daily as needed for pain.      . varenicline (CHANTIX) 1 MG tablet Take 1 tablet (1 mg total) by mouth 2 (two) times daily.  60 tablet  2   No current facility-administered medications for this visit.   Family  History  Problem Relation Age of Onset  . Diabetes Father   . Hypertension Father   . Hyperlipidemia Father   . Stroke Mother    History   Social History  . Marital Status: Single    Spouse Name: N/A    Number of Children: N/A  . Years of Education: N/A   Social History Main Topics  . Smoking status: Former Smoker -- 0.25 packs/day    Types: Cigarettes, Cigars  . Smokeless tobacco: Never Used     Comment: smoking cig for 35 years. continues to smoke 1/2 pack daily  . Alcohol Use: Yes     Comment: ETOH abuse for 25 year, 1/2 gallon Liquor daily. admits 6 weeks course of drinking ETOH as her only intake in 2011. she states that she quits her drinnking since Feb 2012.  . Drug Use: No     Comment: pots before, clean since 1995  . Sexual Activity: No   Other Topics Concern  . None   Social History Narrative   She lives with his stepson in [place]. Moved from [place] 2 years ago. Both  Parents lives in [place]. 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 Did  not complete High School .       She admits only 2-3 hours daily for 15 years. She does not sleep at nighttime. She would watch TV and read at night time. Always sleep during the day. She has seen a psychiatrist at [facility] and still goes to the group therapy on Mondays. She has an follow up appt with her Psychiatrist on 08/07/12.   Review of Systems: Review of Systems:  Constitutional:  Denies fever, chills, diaphoresis, appetite change and fatigue.   HEENT:  Denies congestion, sore throat, rhinorrhea, sneezing, mouth sores, trouble swallowing, neck pain   Respiratory:  Denies SOB, DOE, cough, and wheezing.   Cardiovascular:  Denies palpitations and leg swelling.   Gastrointestinal:  Denies nausea, vomiting, abdominal pain, diarrhea, constipation, blood in stool and abdominal distention.   Genitourinary:  Denies dysuria, urgency, frequency, hematuria, flank pain and  difficulty urinating.   Musculoskeletal:  Denies myalgias, joint swelling, arthralgias and gait problem. Positive for chronic back pain.  Skin:  Denies pallor, rash and wound.   Neurological:  Denies dizziness, seizures, syncope, weakness, light-headedness, numbness and headaches.    .    Objective:  Physical Exam: Filed Vitals:   08/02/13 1445  BP: 134/83  Pulse: 93  Temp: 97.8 F (36.6 C)  TempSrc: Oral  Weight: 142 lb 8 oz (64.638 kg)  SpO2: 98%   General: NAD  Head: normocephalic and atraumatic. Mild carotid bruit noted on right side,  Lungs: CTA B/L Heart: RRR, No M/G/R  Abdomen: soft, non-tender, normal bowel sounds, no distention, no guarding, no rebound tenderness. Pulses: 1+ petal pulse bilaterally. Unable to detect posterial tibia pulse.  Extremities: No cyanosis, clubbing, edema Neurologic: alert & oriented X3.   Assessment & Plan:

## 2013-08-03 NOTE — Assessment & Plan Note (Signed)
chronic back pain managed by her Sport medicine doctor. She reports that her pain is better controlled after two epidural injections.   - continue to follow up with her sports medicine doctor. - She receives refills on Gabapentin, Flexeril, and Mobic from her sports medicine. We will not refill above medicines.

## 2013-08-03 NOTE — Assessment & Plan Note (Signed)
She does not want to stop smoking now. She is offered free patch and does not want to use it now.  Risk of Tobacco abuse is discussed including but not limiting cancer, cardiovascular disease and pulmonary disease.

## 2013-08-06 IMAGING — CR DG LUMBAR SPINE COMPLETE 4+V
5 series · 5 of 5 positions shown · non-contrast
Comparison: CT 06/11/2010.

CLINICAL DATA: History of back pain.

LUMBAR SPINE - COMPLETE 4+ VIEW

[t l-spine a.p.]
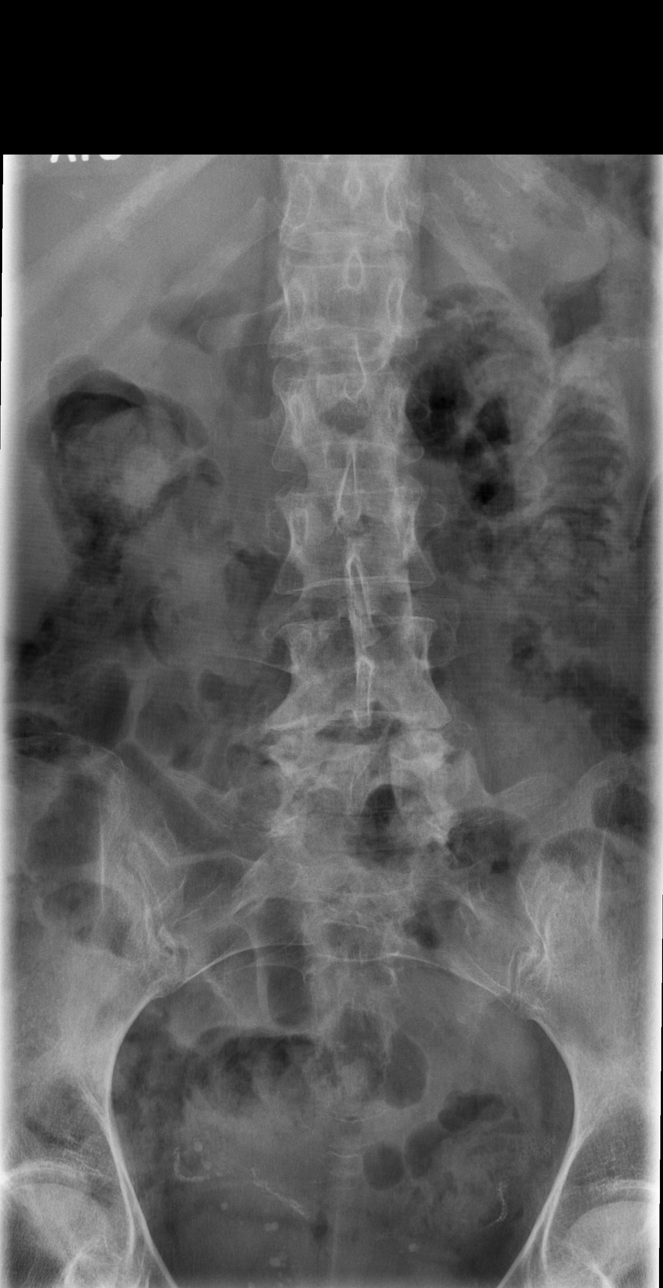

[t l-spine oblique exposure (1 of 2)]
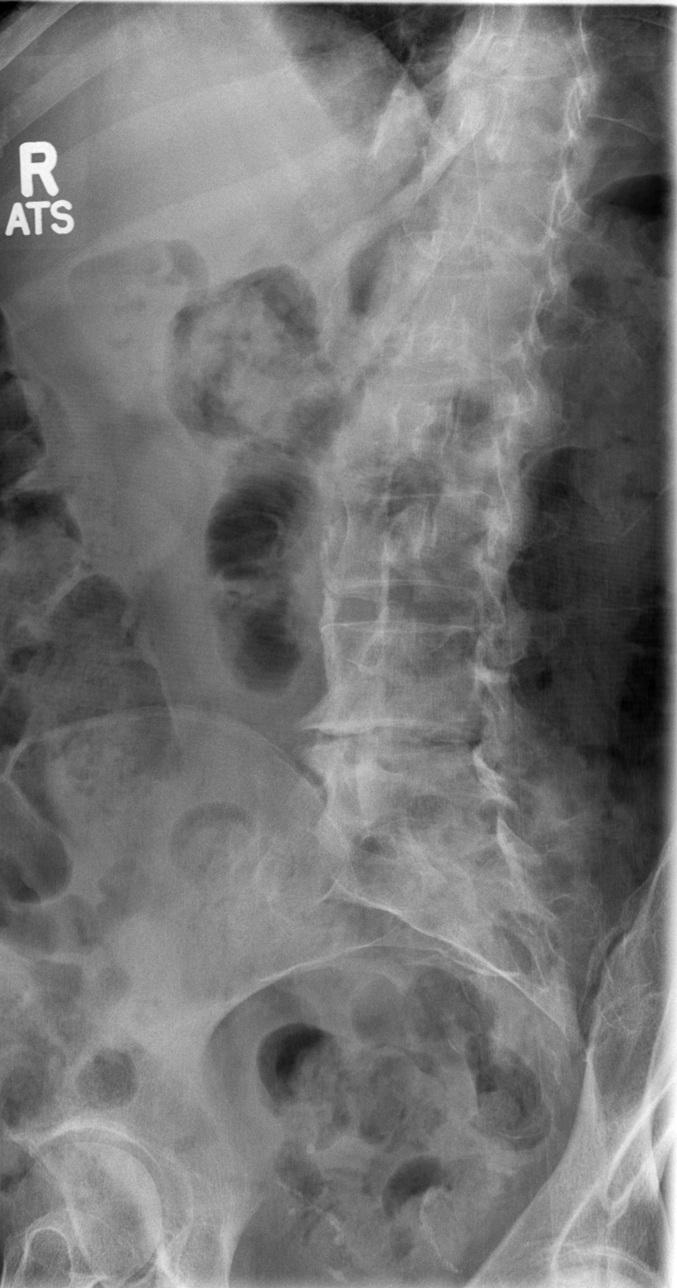

[t l-spine oblique exposure (2 of 2)]
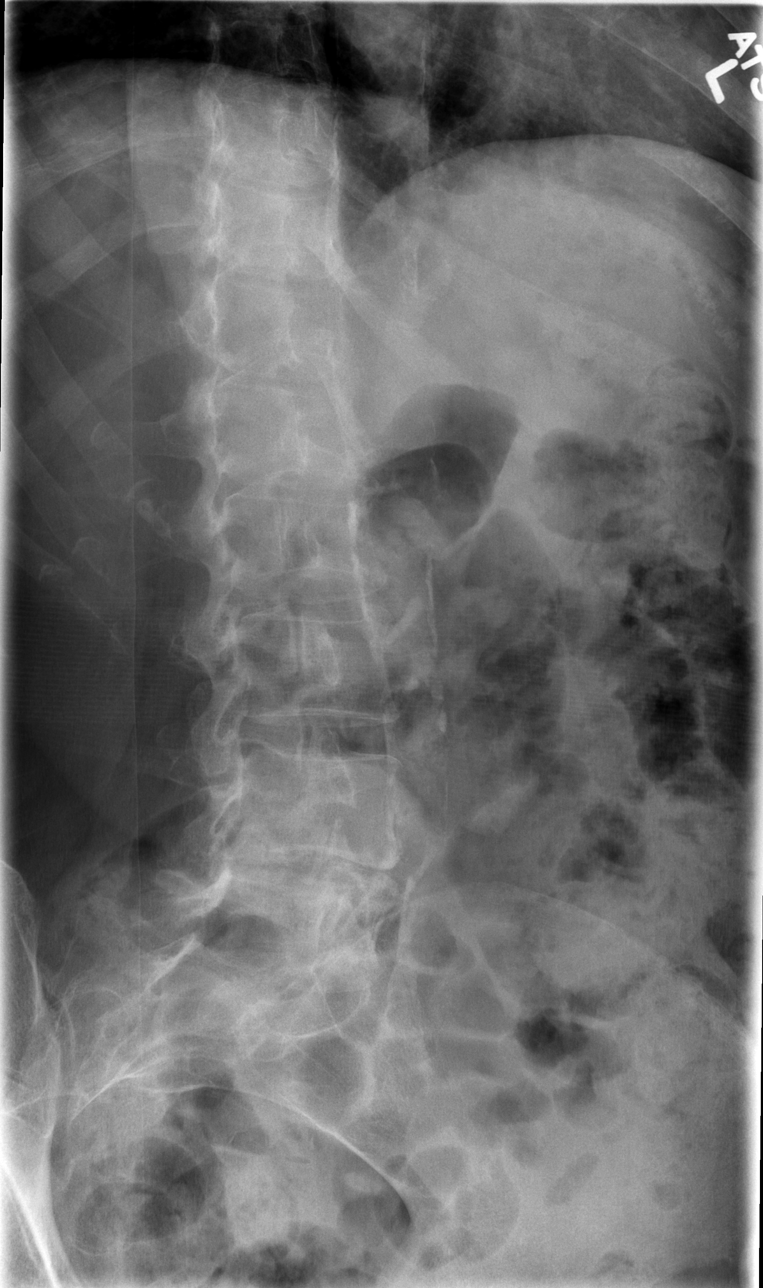

[t l-spine lat]
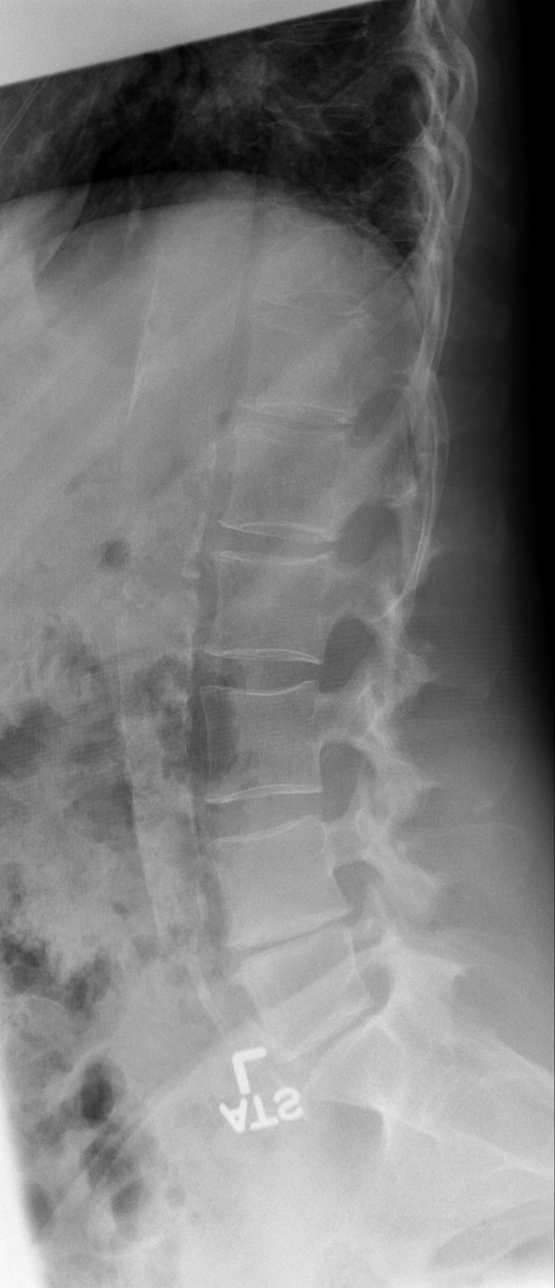

[t l-spine l5-s1 spot]
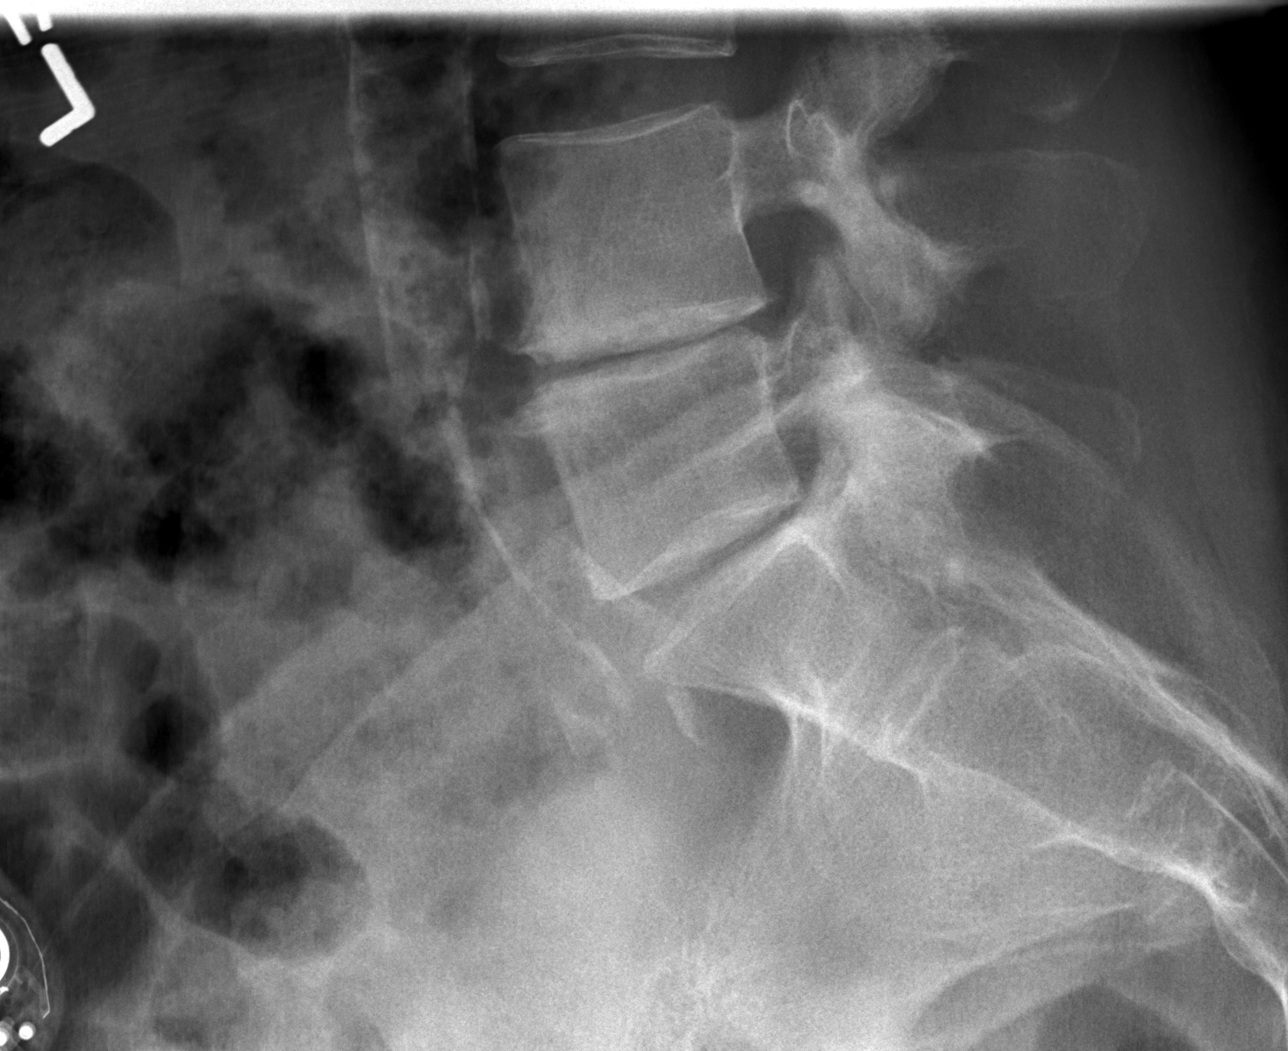

[5 of 5 positions shown; findings below may reference images not displayed]

FINDINGS: There are five lumbar-type vertebral bodies labeled L1-
L5.  There is narrowing of the intervertebral disc spaces at the
levels of L4-L5 and L5-S1. Marginal osteophytes are seen.  There is
slight posterior slippage of the body of L4 on the body of L5.  No
fracture or bony destruction is seen.  No pars defects were
evident.

Nonaneurysmal calcifications of the abdominal aorta and common
iliac arteries are present.  The aorta is densely calcified.  There
is fecal distention of portions of the colon.  SI joints appear
intact.  Pelvic phleboliths are present.
IMPRESSION: Changes of degenerative disc disease and degenerative spondylosis.
No fracture or bony destruction.  Nonaneurysmal abdominal aortic
and common iliac artery calcifications.

Fecal distention of portions of the colon.

## 2013-08-06 NOTE — Progress Notes (Signed)
Case discussed with Dr. Li at the time of the visit.  We reviewed the resident's history and exam and pertinent patient test results.  I agree with the assessment, diagnosis, and plan of care documented in the resident's note. 

## 2013-08-07 ENCOUNTER — Telehealth: Payer: Self-pay | Admitting: Family Medicine

## 2013-08-07 NOTE — Telephone Encounter (Signed)
Called patient to discuss results of C-spine xray. Shows facet arthropathy - likely cause of patient's pain. Recommend continued HEP and ice or heat. F/u as scheduled. Will refer for facet injections if no improvement at next visit.

## 2013-08-28 ENCOUNTER — Ambulatory Visit (INDEPENDENT_AMBULATORY_CARE_PROVIDER_SITE_OTHER): Payer: Medicaid Other | Admitting: Family Medicine

## 2013-08-28 VITALS — BP 111/73 | Ht 66.0 in | Wt 135.0 lb

## 2013-08-28 DIAGNOSIS — M545 Low back pain, unspecified: Secondary | ICD-10-CM

## 2013-08-28 DIAGNOSIS — M79609 Pain in unspecified limb: Secondary | ICD-10-CM

## 2013-08-28 DIAGNOSIS — M79604 Pain in right leg: Secondary | ICD-10-CM

## 2013-08-28 DIAGNOSIS — M5416 Radiculopathy, lumbar region: Secondary | ICD-10-CM

## 2013-08-28 DIAGNOSIS — M79671 Pain in right foot: Secondary | ICD-10-CM

## 2013-08-28 DIAGNOSIS — M25561 Pain in right knee: Secondary | ICD-10-CM

## 2013-08-28 DIAGNOSIS — M25569 Pain in unspecified knee: Secondary | ICD-10-CM

## 2013-08-28 DIAGNOSIS — IMO0002 Reserved for concepts with insufficient information to code with codable children: Secondary | ICD-10-CM

## 2013-08-28 MED ORDER — GABAPENTIN 600 MG PO TABS: 600.0000 mg | ORAL_TABLET | Freq: Three times a day (TID) | ORAL | Status: DC

## 2013-08-28 MED ORDER — METHYLPREDNISOLONE ACETATE 40 MG/ML IJ SUSP
40.0000 mg | Freq: Once | INTRAMUSCULAR | Status: AC
  Administered 2013-08-28: 40 mg via INTRA_ARTICULAR

## 2013-08-28 MED ORDER — CYCLOBENZAPRINE HCL 10 MG PO TABS: 10.0000 mg | ORAL_TABLET | Freq: Three times a day (TID) | ORAL | Status: DC | PRN

## 2013-08-28 MED ORDER — MELOXICAM 15 MG PO TABS: 15.0000 mg | ORAL_TABLET | Freq: Every day | ORAL | Status: DC | PRN

## 2013-08-28 NOTE — Patient Instructions (Addendum)
Thank you for coming in today  For your back and leg, we will work on referral to ortho surgery. For your knee, ice this later today. We injected it today. I would expect you to start to feel relief in next 2-3 days Meds refilled Followup 4 weeks.

## 2013-08-28 NOTE — Progress Notes (Signed)
CC: Followup low back and right leg pain as well as new complaint of right knee pain. HPI: Patient is a very pleasant 57 year old female who I've been seeing for right-sided lumbar radiculopathy. She has had an MRI as well as to epidural steroid injections that provided partial and temporary relief. She is also currently taking Flexeril, meloxicam, and also gabapentin 600 mg 3 times a day. She states that her pain seems to be worsening again both in the back and right leg. She complains of numbness at times in her foot. She does state that her neck pain has improved some and she has been managing it with conservative measures. She currently has intermittent burning type of pain in her neck. She did have neck x-rays that showed significant degenerative disease I suspect largely facet arthropathy.  Patient's new complaint today is with regards to her right knee. She states she has had right knee pain for the last few days. Her knee bothers her intermittently over the last several years. She notes a lot of pain with walking. No swelling. Has not improved at all over the past several days despite continuing her medications. She notes a lot of popping.  ROS: As above in the HPI. All other systems are stable or negative.  OBJECTIVE: APPEARANCE:  Patient in no acute distress.The patient appeared well nourished and normally developed. HEENT: No scleral icterus. Conjunctiva non-injected Resp: Non labored Skin: No rash MSK:  Low back: Positive straight leg raise on the right. Strength is 5 out of 5 in the bilateral lower extremities. Reflexes 2+ symmetrically. Right knee: Full range of motion. Tenderness to palpation along the medial joint line. No effusion. No laxity on varus, valgus, Lachman's. Strength is 5 out of 5. McMurray's is positive for both pain and palpable click.    ASSESSMENT: #1. Low back pain with right-sided radiculopathy with previous temporary and partial response to epidural steroid  injection. Patient has failed conservative therapy at this point with injection and oral medications. #2. Right knee pain, suspect degenerative meniscal pathology.  PLAN: #1. For her lumbar radiculopathy, I do think that she would benefit from surgical consultation at this time. She will continue her oral medications which were refilled today. We will refer her to spine surgery. #2. For right knee pain with likely degenerative meniscal injury, patient desires corticosteroid injection today. She was also encouraged to ice the knee and take it easy for the next several days. We will see her back in one month.   Consent obtained and verified.  Time-out conducted.  Noted no overlying erythema, induration, or other signs of local infection.  Skin prepped in a sterile fashion.  Topical analgesic spray: Ethyl chloride.  Joint: Right knee Needle: 25-gauge 1-1/2 inch Completed without difficulty.  Meds: 4 cc 1% lidocaine, 1 cc depomedrol Advised to call if fevers/chills, erythema, induration, drainage, or persistent bleeding.

## 2013-08-29 ENCOUNTER — Other Ambulatory Visit: Payer: Self-pay | Admitting: Internal Medicine

## 2013-08-29 DIAGNOSIS — M545 Low back pain: Secondary | ICD-10-CM

## 2013-08-29 DIAGNOSIS — M79604 Pain in right leg: Secondary | ICD-10-CM

## 2013-09-10 NOTE — Addendum Note (Signed)
Addended by: [name] on: 09/10/2013 05:58 PM   Modules accepted: Orders

## 2013-09-20 ENCOUNTER — Ambulatory Visit (INDEPENDENT_AMBULATORY_CARE_PROVIDER_SITE_OTHER): Payer: Medicaid Other | Admitting: *Deleted

## 2013-09-20 DIAGNOSIS — Z23 Encounter for immunization: Secondary | ICD-10-CM

## 2013-09-20 DIAGNOSIS — Z0000 Encounter for general adult medical examination without abnormal findings: Secondary | ICD-10-CM

## 2013-10-02 ENCOUNTER — Encounter: Payer: Self-pay | Admitting: Family Medicine

## 2013-10-02 ENCOUNTER — Ambulatory Visit (INDEPENDENT_AMBULATORY_CARE_PROVIDER_SITE_OTHER): Payer: Medicaid Other | Admitting: Family Medicine

## 2013-10-02 VITALS — BP 129/80 | Ht 66.0 in | Wt 135.0 lb

## 2013-10-02 DIAGNOSIS — IMO0002 Reserved for concepts with insufficient information to code with codable children: Secondary | ICD-10-CM

## 2013-10-02 DIAGNOSIS — M542 Cervicalgia: Secondary | ICD-10-CM

## 2013-10-02 DIAGNOSIS — M5416 Radiculopathy, lumbar region: Secondary | ICD-10-CM

## 2013-10-02 MED ORDER — TRAMADOL HCL 50 MG PO TABS: 50.0000 mg | ORAL_TABLET | Freq: Four times a day (QID) | ORAL | Status: DC | PRN

## 2013-10-02 NOTE — Progress Notes (Signed)
CC: Followup low back and neck pain HPI: Patient returns today for followup. Unfortunately, she states that her back and neck pain have worsened. She continues to complain of severe pain in her low back that radiates down her right leg in the posterior thigh and posterior calf. She temporarily to get better after the epidural steroid injection but it seems to be worsening again. She has tried the medications that I have her on including Mobic, Flexeril, and gabapentin 600 mg 3 times a day as well as using ice, heat, and her TENS unit. Unfortunately she continues to have severe pain. No bowel or bladder symptoms. She is also complaining of severe left-sided neck pain that has become exacerbated recently. She does have facet arthropathy of the cervical spine. She is taking her medications as above as well as using ice, heat, doing her home exercises for her neck, and using her TENS unit. Unfortunately she has been unable to get any relief. She describes the pain as burning and sharp on the left side of her neck radiating from the neck down to the level of her trapezius.  ROS: As above in the HPI. All other systems are stable or negative.  OBJECTIVE: APPEARANCE:  Patient in no acute distress.The patient appeared well nourished and normally developed. HEENT: No scleral icterus. Conjunctiva non-injected Resp: Non labored Skin: No rash MSK:  Neck: Equivocal spurling's Full neck range of motion Grip strength and sensation normal in bilateral hands Strength good C4 to T1 distribution No sensory change to C4 to T1   Low back exam: - Full range of motion in flexion, extension, lateral bending, rotation without pain  - No tenderness to palpation over the spinous processes of the lumbar vertebra - Tenderness to palpation along the right iliac crest, sciatic notch, right-sided low back muscles - Strength 5 out of 5 in the bilateral lower extremities - Reflexes 2+ bilaterally.  ASSESSMENT: #1.  Chronic low back pain and right-sided radiculopathy in L5 vs S1 distribution  #2. Flare of left-sided neck pain, suspect combination of facet arthropathy and muscular  PLAN: At this point, patient has failed conservative therapy with epidural steroid injections, home exercise plan, medications , heat and ice. I think physical therapy would be of benefit to her neck pain however she unfortunately does not have insurance that'll allow her to do this. She may benefit from facet injections in her neck. We will get her into see Dr. Yevette Edwards at [facility] orthopedics for evaluation of her chronic low back pain and right-sided radiculopathy to see if there are any surgical options for her. I will hold off on ordering any cervical facet joint injections at this time until patient is seen over at the spine clinic. An appointment was scheduled for her today for this coming Friday.

## 2013-10-02 NOTE — Patient Instructions (Addendum)
You have been scheduled for an appointment with Dr. Yevette Edwards on 10/05/13 at 9:30 am, please arrive at 8:45 am to fill out paper work.   His office is located at:  [facility]  [address]  [place]   [phone]   Thank you for coming in today We will get you in with the spine surgeon Please continue your mobic, flexeril, and gabapentin Use tramadol only occasionally for severe pain Include moderate daily exercise I think that your back pain is a pinched nerve in your back. Your neck pain may be a combination of facet joint arthritis and muscular pain.

## 2013-10-03 ENCOUNTER — Other Ambulatory Visit: Payer: Self-pay | Admitting: Internal Medicine

## 2013-10-03 DIAGNOSIS — Z1231 Encounter for screening mammogram for malignant neoplasm of breast: Secondary | ICD-10-CM

## 2013-10-22 ENCOUNTER — Telehealth: Payer: Self-pay | Admitting: *Deleted

## 2013-10-22 NOTE — Telephone Encounter (Signed)
Spoke with pt- states she has been on a increased dose of gabapentin for the past month- 600 mg tid.  She has been religous about taking that dose for 2 weeks.  Saturday she started "violently vomitting" all day until Sunday morning at 8:30 am.  She is feeling better now, has not taken gabapentin since midday Saturday.  Suggested to pt that this could have been a GI virus, she insists that it was not.  States she has never vomited like that before.  Advised she can try decreasing her dose to 300 mg tid for 2-3 days to see how she feels, and increase back to 600 mg tid as tolerated.  Asked pt to call back if she starts having similar symptoms again.  Will send to Dr. [name] for further recommendations.

## 2013-10-24 NOTE — Telephone Encounter (Signed)
Dr. [name] agreed with plan.

## 2013-10-30 ENCOUNTER — Ambulatory Visit (HOSPITAL_COMMUNITY)
Admission: RE | Admit: 2013-10-30 | Discharge: 2013-10-30 | Disposition: A | Discharge status: Home / Self Care | Payer: Medicare Other | Source: Ambulatory Visit | Attending: Internal Medicine | Admitting: Internal Medicine

## 2013-10-30 ENCOUNTER — Other Ambulatory Visit: Payer: Self-pay | Admitting: Internal Medicine

## 2013-10-30 DIAGNOSIS — Z13820 Encounter for screening for osteoporosis: Secondary | ICD-10-CM | POA: Insufficient documentation

## 2013-10-30 DIAGNOSIS — M8580 Other specified disorders of bone density and structure, unspecified site: Secondary | ICD-10-CM

## 2013-10-30 DIAGNOSIS — Z1231 Encounter for screening mammogram for malignant neoplasm of breast: Secondary | ICD-10-CM

## 2013-10-30 DIAGNOSIS — M899 Disorder of bone, unspecified: Secondary | ICD-10-CM | POA: Insufficient documentation

## 2013-11-02 ENCOUNTER — Encounter: Payer: Self-pay | Admitting: Internal Medicine

## 2013-11-06 ENCOUNTER — Ambulatory Visit: Payer: Medicaid Other | Admitting: Internal Medicine

## 2013-11-12 ENCOUNTER — Ambulatory Visit (INDEPENDENT_AMBULATORY_CARE_PROVIDER_SITE_OTHER): Payer: Medicare Other | Admitting: Internal Medicine

## 2013-11-12 ENCOUNTER — Encounter: Payer: Self-pay | Admitting: Internal Medicine

## 2013-11-12 VITALS — BP 117/70 | HR 91 | Temp 98.1°F | Ht 66.0 in | Wt 141.2 lb

## 2013-11-12 DIAGNOSIS — F1021 Alcohol dependence, in remission: Secondary | ICD-10-CM

## 2013-11-12 DIAGNOSIS — F17200 Nicotine dependence, unspecified, uncomplicated: Secondary | ICD-10-CM

## 2013-11-12 DIAGNOSIS — I10 Essential (primary) hypertension: Secondary | ICD-10-CM

## 2013-11-12 DIAGNOSIS — F32A Depression, unspecified: Secondary | ICD-10-CM

## 2013-11-12 DIAGNOSIS — M549 Dorsalgia, unspecified: Secondary | ICD-10-CM

## 2013-11-12 DIAGNOSIS — Z23 Encounter for immunization: Secondary | ICD-10-CM | POA: Insufficient documentation

## 2013-11-12 DIAGNOSIS — Z789 Other specified health status: Secondary | ICD-10-CM

## 2013-11-12 DIAGNOSIS — Z720 Tobacco use: Secondary | ICD-10-CM

## 2013-11-12 DIAGNOSIS — G8929 Other chronic pain: Secondary | ICD-10-CM

## 2013-11-12 DIAGNOSIS — F329 Major depressive disorder, single episode, unspecified: Secondary | ICD-10-CM

## 2013-11-12 LAB — CBC
HCT: 33.3 % — ABNORMAL LOW (ref 36.0–46.0)
MCHC: 33.3 g/dL (ref 30.0–36.0)
RDW: 14.7 % (ref 11.5–15.5)

## 2013-11-12 NOTE — Assessment & Plan Note (Signed)
Assessment: Patient is a 57 y.o. pleasant woman with PMH of polysubstance abuse, anxiety/depression, chronic back pain. She has had quite a lot stressors in her life. She just had recent major depressed mood which was treated by her psychiatrist at [facility]. She feels better now. Denies depressed mood. Denies SI/HI.  Plan:  - continue current therapy - thorough discussion on signs and symptoms of Depression - she understands to call 911 or go to ED if she has SI/HI

## 2013-11-12 NOTE — Assessment & Plan Note (Signed)
  Assessment: Progress toward smoking cessation:  smoking less Barriers to progress toward smoking cessation:  lack of motivation to quit Comments: She is able to smoke less Cig now.   Plan: Instruction/counseling given:  I counseled patient on the dangers of tobacco use, advised patient to stop smoking, and reviewed strategies to maximize success. Educational resources provided:  [employer]line[employer]) brochure Self management tools provided:  smoking cessation plan (STAR Quit Plan) Medications to assist with smoking cessation:  Nicotine Patch Patient agreed to the following self-care plans for smoking cessation: go to the [employer] ([employer])  Other plans:  Patient states that she is not ready to stop smoking completely. She states that she will try. She does not want take Nicotine products for now.

## 2013-11-12 NOTE — Patient Instructions (Addendum)
1. Continue your appt with Lumbar spinal injection this week. 2. Continue current medical therapy 3. Continue your appt with [facility] as scheduled. 4. Call 911 or go to ED if you experience suicidal or homicidal ideation. 5. Please come back for hepatitis B shot in Jan and May.   Depression, Adult Depression refers to feeling sad, low, down in the dumps, blue, gloomy, or empty. In general, there are two kinds of depression: 1. Depression that we all experience from time to time because of upsetting life experiences, including the loss of a job or the ending of a relationship (normal sadness or normal grief). This kind of depression is considered normal, is short lived, and resolves within a few days to 2 weeks. (Depression experienced after the loss of a loved one is called bereavement. Bereavement often lasts longer than 2 weeks but normally gets better with time.) 2. Clinical depression, which lasts longer than normal sadness or normal grief or interferes with your ability to function at home, at work, and in school. It also interferes with your personal relationships. It affects almost every aspect of your life. Clinical depression is an illness. Symptoms of depression also can be caused by conditions other than normal sadness and grief or clinical depression. Examples of these conditions are listed as follows:  Physical illness Some physical illnesses, including underactive thyroid gland (hypothyroidism), severe anemia, specific types of cancer, diabetes, uncontrolled seizures, heart and lung problems, strokes, and chronic pain are commonly associated with symptoms of depression.  Side effects of some prescription medicine In some people, certain types of prescription medicine can cause symptoms of depression.  Substance abuse Abuse of alcohol and illicit drugs can cause symptoms of depression. SYMPTOMS Symptoms of normal sadness and normal grief include the following:  Feeling sad or crying  for short periods of time.  Not caring about anything (apathy).  Difficulty sleeping or sleeping too much.  No longer able to enjoy the things you used to enjoy.  Desire to be by oneself all the time (social isolation).  Lack of energy or motivation.  Difficulty concentrating or remembering.  Change in appetite or weight.  Restlessness or agitation. Symptoms of clinical depression include the same symptoms of normal sadness or normal grief and also the following symptoms:  Feeling sad or crying all the time.  Feelings of guilt or worthlessness.  Feelings of hopelessness or helplessness.  Thoughts of suicide or the desire to harm yourself (suicidal ideation).  Loss of touch with reality (psychotic symptoms). Seeing or hearing things that are not real (hallucinations) or having false beliefs about your life or the people around you (delusions and paranoia). DIAGNOSIS  The diagnosis of clinical depression usually is based on the severity and duration of the symptoms. Your caregiver also will ask you questions about your medical history and substance use to find out if physical illness, use of prescription medicine, or substance abuse is causing your depression. Your caregiver also may order blood tests. TREATMENT  Typically, normal sadness and normal grief do not require treatment. However, sometimes antidepressant medicine is prescribed for bereavement to ease the depressive symptoms until they resolve. The treatment for clinical depression depends on the severity of your symptoms but typically includes antidepressant medicine, counseling with a mental health professional, or a combination of both. Your caregiver will help to determine what treatment is best for you. Depression caused by physical illness usually goes away with appropriate medical treatment of the illness. If prescription medicine is causing depression, talk  with your caregiver about stopping the medicine, decreasing  the dose, or substituting another medicine. Depression caused by abuse of alcohol or illicit drugs abuse goes away with abstinence from these substances. Some adults need professional help in order to stop drinking or using drugs. SEEK IMMEDIATE CARE IF:  You have thoughts about hurting yourself or others.  You lose touch with reality (have psychotic symptoms).  You are taking medicine for depression and have a serious side effect. FOR MORE INFORMATION National Alliance on Mental Illness: www.nami.[employer] of Mental Health: http://www.maynard.net/ Document Released: 11/26/2000 Document Revised: 05/30/2012 Document Reviewed: 02/28/2012 [facility] Patient Information 2014 [place], [place].

## 2013-11-12 NOTE — Assessment & Plan Note (Signed)
Patient will get 1st vaccine on 11/12/13. She should come back in Jan for 2nd shot and May for 3rd shot.

## 2013-11-12 NOTE — Progress Notes (Signed)
Patient ID: Monique Perry, female   DOB: 23-Mar-1956, 57 y.o.   MRN: [id number]   Subjective:   Patient ID: Monique Perry female   DOB: Dec 28, 1955 57 y.o.   MRN: [id number]  HPI: [name] Monique Perry is a 57 y.o. woman with PMH of polysubstance abuse, anxiety/depression, chronic back pain followed by Dr. Margaretmary Bayley medicine [name], sleep depravation, collagenous colitis followed by GI Dr. Elnoria Howard, who presents to the clinic for follow up visit.    # Depression  Patient states that she had major depressed mood about two weeks ago. Denies SI/HI. She went back to see her Psychiatrist at [facility] and was prescribed with Seroquel. She reports medical compliance and states that she feels better. She is no longer depressed. Denies SI/HI. She lives with her parents which is helpful to her condition.    # Chronic back pain    She had a couple of L4-L5 lumbar epidural steroid injections this year.  She is followed by Dr. [name] who put her on gabapentin 600 mg 3 times a day, Flexeril  10 mg 3 times per day and Mobic 10 mg po daily PRN. She states that she is scheduled to have another lumbar epidural steroid injection this week.    #. Tobacco abuse- 1 cig daily     She does not want to stop smoking now. She is offered free patch and does not want to use it now.  Risk of Tobacco abuse is discussed including but not limiting cancer, cardiovascular disease and pulmonary disease.   Health maintenance - ABI and carotid doppler ordered and she has not showed up for these exam.  - Hepatitis B vaccine given her history of Alcohol abuse.    Past Medical History  Diagnosis Date  . Alcohol abuse last February 2012  . Drug abuse last 1990's  . Arthritis   . Collagenous colitis   . Bipolar disorder    Current Outpatient Prescriptions  Medication Sig Dispense Refill  . calcium citrate-vitamin D (CITRACAL+D) 315-200 MG-UNIT per tablet Take 1 tablet by mouth 2 (two) times daily. She takes Calcium Vitamin D 600-800 1  tab BID      . cyclobenzaprine (FLEXERIL) 10 MG tablet Take 1 tablet (10 mg total) by mouth 3 (three) times daily as needed for muscle spasms.  90 tablet  2  . gabapentin (NEURONTIN) 600 MG tablet Take 1 tablet (600 mg total) by mouth 3 (three) times daily.  90 tablet  3  . meloxicam (MOBIC) 15 MG tablet Take 1 tablet (15 mg total) by mouth daily as needed for pain.  30 tablet  2  . QUEtiapine Fumarate (SEROQUEL XR) 150 MG 24 hr tablet Take 150 mg by mouth at bedtime.       No current facility-administered medications for this visit.   Family History  Problem Relation Age of Onset  . Diabetes Father   . Hypertension Father   . Hyperlipidemia Father   . Stroke Mother    History   Social History  . Marital Status: Single    Spouse Name: N/A    Number of Children: N/A  . Years of Education: N/A   Social History Main Topics  . Smoking status: Current Some Day Smoker -- 0.25 packs/day    Types: Cigarettes, Cigars  . Smokeless tobacco: Never Used     Comment: smoking cig for 35 years. continues to smoke 1/2 pack daily  . Alcohol Use: Yes     Comment: ETOH abuse for  25 year, 1/2 gallon Liquor daily. admits 6 weeks course of drinking ETOH as her only intake in 2011. she states that she quits her drinnking since Feb 2012.  . Drug Use: No     Comment: pots before, clean since 1995  . Sexual Activity: No   Other Topics Concern  . Not on file   Social History Narrative   She lives with his stepson in [place]. Moved from [place] 2 years ago. Both  Parents lives in [place]. 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 Did not complete High School .       She admits only 2-3 hours daily for 15 years. She does not sleep at nighttime. She would watch TV and read at night time. Always sleep during the day. She has seen a psychiatrist at [facility] and still goes to the group therapy on Mondays. She has an follow up appt with her  Psychiatrist on 08/07/12.   Review of Systems:  Constitutional:  Denies fever, chills, diaphoresis, appetite change and fatigue.   HEENT:  Denies congestion, sore throat, rhinorrhea, sneezing, mouth sores, trouble swallowing, neck pain   Respiratory:  Denies SOB, DOE, cough, and wheezing.   Cardiovascular:  Denies palpitations and leg swelling.   Gastrointestinal:  Denies nausea, vomiting, abdominal pain, diarrhea, constipation, blood in stool and abdominal distention.   Genitourinary:  Denies dysuria, urgency, frequency, hematuria, flank pain and difficulty urinating.   Musculoskeletal:  Denies myalgias, joint swelling, arthralgias and gait problem. Positive for chronic back pain.  Skin:  Denies pallor, rash and wound.   Neurological:  Denies dizziness, seizures, syncope, weakness, light-headedness, numbness and headaches.    .    Objective:  Physical Exam: Filed Vitals:   11/12/13 1314  BP: 117/70  Pulse: 91  Temp: 98.1 F (36.7 C)  TempSrc: Oral  Height: 5\' 6"  (1.676 m)  Weight: 141 lb 3.2 oz (64.048 kg)  SpO2: 96%   General: NAD  Head: normocephalic and atraumatic. Mild carotid bruit noted on right side,  Lungs: CTA B/L Heart: RRR, No M/G/R  Abdomen: soft, non-tender, normal bowel sounds, no distention, no guarding, no rebound tenderness. Pulses: 1+ petal pulse bilaterally. Unable to detect posterial tibia pulse.  Extremities: No cyanosis, clubbing, edema Neurologic: alert & oriented X3.   Assessment & Plan:

## 2013-11-12 NOTE — Assessment & Plan Note (Signed)
Assessment: She has a chronic back pain which is managed by her sports medicine Dr. [name]. Her back pain is at her baseline now.  Plan: - continue current therapy including Gabapentin, flexeril and Mobic as prescribed by Dr. [name]. - continue her scheduled lumbar spinal injection this week as scheduled.

## 2013-11-13 LAB — COMPLETE METABOLIC PANEL WITH GFR
Albumin: 3.7 g/dL (ref 3.5–5.2)
CO2: 31 mEq/L (ref 19–32)
GFR, Est African American: >89 mL/min
GFR, Est Non African American: >89 mL/min
Glucose, Bld: 73 mg/dL (ref 70–99)
Potassium: 4.9 mEq/L (ref 3.5–5.3)
Sodium: 141 mEq/L (ref 135–145)
Total Protein: 6 g/dL (ref 6.0–8.3)

## 2013-11-13 LAB — TSH: TSH: 1.116 u[IU]/mL (ref 0.350–4.500)

## 2013-11-14 ENCOUNTER — Other Ambulatory Visit (INDEPENDENT_AMBULATORY_CARE_PROVIDER_SITE_OTHER): Payer: Medicare Other

## 2013-11-14 ENCOUNTER — Other Ambulatory Visit: Payer: Self-pay | Admitting: Internal Medicine

## 2013-11-14 DIAGNOSIS — D649 Anemia, unspecified: Secondary | ICD-10-CM

## 2013-11-14 LAB — ANEMIA PANEL
Folate: 4.9 ng/mL
Retic Ct Pct: 1.7 % (ref 0.4–2.3)
UIBC: 258 ug/dL (ref 125–400)
Vitamin B-12: 384 pg/mL (ref 211–911)

## 2013-11-14 NOTE — Progress Notes (Signed)
Case discussed with Dr. Li soon after the resident saw the patient. We reviewed the resident's history and exam and pertinent patient test results. I agree with the assessment, diagnosis, and plan of care documented in the resident's note. 

## 2013-12-04 ENCOUNTER — Other Ambulatory Visit: Payer: Self-pay | Admitting: Family Medicine

## 2013-12-04 MED ORDER — TRAMADOL HCL 50 MG PO TABS: 50.0000 mg | ORAL_TABLET | Freq: Four times a day (QID) | ORAL | Status: DC | PRN

## 2013-12-18 ENCOUNTER — Ambulatory Visit: Payer: Medicare Other | Admitting: *Deleted

## 2013-12-18 DIAGNOSIS — Z23 Encounter for immunization: Secondary | ICD-10-CM

## 2013-12-18 MED ORDER — HEPATITIS B VAC RECOMBINANT 5 MCG/0.5ML IJ SUSP
0.5000 mL | Freq: Once | INTRAMUSCULAR | Status: AC
  Administered 2013-12-18: 0.5 ug via INTRAMUSCULAR

## 2013-12-20 ENCOUNTER — Other Ambulatory Visit: Payer: Self-pay | Admitting: Family Medicine

## 2014-01-14 DIAGNOSIS — F3189 Other bipolar disorder: Secondary | ICD-10-CM | POA: Diagnosis not present

## 2014-01-21 ENCOUNTER — Other Ambulatory Visit: Payer: Self-pay | Admitting: Family Medicine

## 2014-02-14 ENCOUNTER — Encounter: Payer: Self-pay | Admitting: Internal Medicine

## 2014-02-14 ENCOUNTER — Ambulatory Visit (INDEPENDENT_AMBULATORY_CARE_PROVIDER_SITE_OTHER): Payer: Medicare Other | Admitting: Internal Medicine

## 2014-02-14 VITALS — BP 122/72 | HR 85 | Temp 98.1°F | Ht 66.0 in | Wt 157.2 lb

## 2014-02-14 DIAGNOSIS — R740 Nonspecific elevation of levels of transaminase and lactic acid dehydrogenase [LDH]: Principal | ICD-10-CM

## 2014-02-14 DIAGNOSIS — F32A Depression, unspecified: Secondary | ICD-10-CM

## 2014-02-14 DIAGNOSIS — R7402 Elevation of levels of lactic acid dehydrogenase (LDH): Secondary | ICD-10-CM

## 2014-02-14 DIAGNOSIS — F17200 Nicotine dependence, unspecified, uncomplicated: Secondary | ICD-10-CM

## 2014-02-14 DIAGNOSIS — F329 Major depressive disorder, single episode, unspecified: Secondary | ICD-10-CM | POA: Diagnosis not present

## 2014-02-14 DIAGNOSIS — F3289 Other specified depressive episodes: Secondary | ICD-10-CM | POA: Diagnosis not present

## 2014-02-14 DIAGNOSIS — Z720 Tobacco use: Secondary | ICD-10-CM

## 2014-02-14 DIAGNOSIS — R7401 Elevation of levels of liver transaminase levels: Secondary | ICD-10-CM | POA: Insufficient documentation

## 2014-02-14 MED ORDER — VARENICLINE TARTRATE 1 MG PO TABS: 1.0000 mg | ORAL_TABLET | Freq: Two times a day (BID) | ORAL | Status: DC

## 2014-02-14 NOTE — Patient Instructions (Addendum)
1. Will follow up on your liver enzymes in June 2. chantix start 0.5 mg po daily x 3 days, then 0.5 BID x 4 days. Then 1 mg po BID. 3. Please have ABI and carotid doppler done asap 4. Follow up in 1 months.

## 2014-02-14 NOTE — Progress Notes (Signed)
Subjective:   Patient ID: Monique Perry female   DOB: [birth year] 58 y.o.   MRN: [id number]  HPI: [name] is a 58 y.o. woman with PMH of polysubstance abuse, anxiety/depression, chronic back pain followed by Dr. Jon Gills medicine Maryln Gottron, sleep depravation, collagenous colitis followed by GI Dr. Benson [place], who presents to the clinic for follow up visit.    # Depression  Patient states that she feels fine. Denies depressed mood. Denies SI/HI. She is followed by her Psychiatrist at [facility] and is compliant with Seroquel.  She lives with her parents which is helpful to her condition.    # Chronic back pain, stable    She is followed by Dr. Maryln Gottron and is on gabapentin 600 mg 3 times a day, Flexeril  10 mg 3 times per day and Mobic 10 mg po daily PRN.   #. Tobacco abuse-      We discussed the importance of smoking cessation, and she agrees with the plan.   Health maintenance - ABI and carotid doppler ordered in 2014 and she has not showed up for these exam. We will reschedule the tests for her.  - Hepatitis B vaccine given her history of Alcohol abuse--3rd vaccine in May/June this year.       Past Medical History  Diagnosis Date  . Alcohol abuse last February 2012  . Drug abuse last 1990's  . Arthritis   . Collagenous colitis   . Bipolar disorder    Current Outpatient Prescriptions  Medication Sig Dispense Refill  . calcium citrate-vitamin D (CITRACAL+D) 315-200 MG-UNIT per tablet Take 1 tablet by mouth 2 (two) times daily. She takes Calcium Vitamin D 600-800 1 tab BID      . cyclobenzaprine (FLEXERIL) 10 MG tablet TAKE 1 TABLET BY MOUTH 3 TIMES A DAY AS NEEDED FOR MUSCLE SPASM  90 tablet  2  . gabapentin (NEURONTIN) 300 MG capsule TAKE 2 CAPSULES BY MOUTH 3 TIMES A DAY  180 capsule  3  . meloxicam (MOBIC) 15 MG tablet Take 1 tablet (15 mg total) by mouth daily as needed for pain.  30 tablet  2  . QUEtiapine Fumarate (SEROQUEL XR) 150 MG 24 hr tablet Take 150 mg by mouth at bedtime.       . traMADol (ULTRAM) 50 MG tablet Take 1 tablet (50 mg total) by mouth every 6 (six) hours as needed.  50 tablet  2   No current facility-administered medications for this visit.   Family History  Problem Relation Age of Onset  . Diabetes Father   . Hypertension Father   . Hyperlipidemia Father   . Stroke Mother    History   Social History  . Marital Status: Single    Spouse Name: N/A    Number of Children: N/A  . Years of Education: N/A   Social History Main Topics  . Smoking status: Current Some Day Smoker -- 0.25 packs/day    Types: Cigarettes, Cigars  . Smokeless tobacco: Never Used     Comment: smoking cig for 35 years. continues to smoke 1/2 pack daily  . Alcohol Use: Yes     Comment: ETOH abuse for 25 year, 1/2 gallon Liquor daily. admits 6 weeks course of drinking ETOH as her only intake in 2011. she states that she quits her drinnking since Feb 2012.  . Drug Use: No     Comment: pots before, clean since 1995  . Sexual Activity: No   Other Topics Concern  . None  Social History Narrative   She lives with his stepson in [place]. Moved from [place] 2 years ago. Both  Parents lives in [place]. 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 Did not complete High School .       She admits only 2-3 hours daily for 15 years. She does not sleep at nighttime. She would watch TV and read at night time. Always sleep during the day. She has seen a psychiatrist at [facility] and still goes to the group therapy on Mondays. She has an follow up appt with her Psychiatrist on 08/07/12.   Review of Systems:  Constitutional:  Denies fever, chills, diaphoresis, appetite change and fatigue.   HEENT:  Denies congestion, sore throat, rhinorrhea, sneezing, mouth sores, trouble swallowing, neck pain   Respiratory:  Denies SOB, DOE, cough, and wheezing.   Cardiovascular:  Denies palpitations and leg swelling.   Gastrointestinal:   Denies nausea, vomiting, abdominal pain, diarrhea, constipation, blood in stool and abdominal distention.   Genitourinary:  Denies dysuria, urgency, frequency, hematuria, flank pain and difficulty urinating.   Musculoskeletal:  Denies myalgias, joint swelling, arthralgias and gait problem. Positive for chronic back pain.  Skin:  Denies pallor, rash and wound.   Neurological:  Denies dizziness, seizures, syncope, weakness, light-headedness, numbness and headaches.    .    Objective:  Physical Exam: Filed Vitals:   02/14/14 1346  BP: 122/72  Pulse: 85  Temp: 98.1 F (36.7 C)  TempSrc: Oral  Height: 5\' 6"  (1.676 m)  Weight: 157 lb 3.2 oz (71.305 kg)  SpO2: 90%   General: NAD  Head: normocephalic and atraumatic. Mild carotid bruit noted on right side,  Lungs: CTA B/L Heart: RRR, No M/G/R  Abdomen: soft, non-tender, normal bowel sounds, no distention, no guarding, no rebound tenderness. Pulses: 1+ petal pulse bilaterally. Unable to detect posterial tibia pulse.  Extremities: No cyanosis, clubbing, edema Neurologic: alert & oriented X3.   Assessment & Plan:

## 2014-02-16 NOTE — Assessment & Plan Note (Addendum)
Assessment: She has very mildly elevated liver transaminases. She is asymptomatic. Though she has a history of alcoholism and she has been abstinence from alcohol for years. No hx of viral hepatitis. She is in process of getting her Hepatitis B vaccines.   Plan: - will be back for 3rd Hepatitis B vaccine in May/June 2015 - consider repeat her HFT in 63-month follow up.

## 2014-02-16 NOTE — Assessment & Plan Note (Signed)
Assessment: Well controlled on Seroquel. She follows up with the [facility].   Plan: - continue current therapy

## 2014-02-16 NOTE — Assessment & Plan Note (Signed)
  Assessment: Progress toward smoking cessation:   she is committed to stop smoking.  Barriers to progress toward smoking cessation:   None She has tried nicotine patch in the past and does not think that she would benefit from it. She would like to try the Chantix.   Plan: Instruction/counseling given:  I counseled patient on the dangers of tobacco use, advised patient to stop smoking, and reviewed strategies to maximize success. Educational resources provided:    Self management tools provided:    Medications to assist with smoking cessation:  Varenicline (Chantix) Patient agreed to the following self-care plans for smoking cessation:    Other plans:   1. Will start the Chantix. Will give her one month supply with a close follow up.  2. I discussed with her about the Chantix therapy and side effect/adverse effects discussed.

## 2014-02-20 NOTE — Progress Notes (Signed)
Case discussed with Dr. Li at the time of the visit.  We reviewed the resident's history and exam and pertinent patient test results.  I agree with the assessment, diagnosis, and plan of care documented in the resident's note. 

## 2014-02-21 ENCOUNTER — Ambulatory Visit (HOSPITAL_COMMUNITY)
Admission: RE | Admit: 2014-02-21 | Discharge: 2014-02-21 | Disposition: A | Discharge status: Home / Self Care | Payer: Medicare Other | Source: Ambulatory Visit | Attending: Internal Medicine | Admitting: Internal Medicine

## 2014-02-21 ENCOUNTER — Other Ambulatory Visit (HOSPITAL_COMMUNITY): Payer: Self-pay | Admitting: Internal Medicine

## 2014-02-21 DIAGNOSIS — R0989 Other specified symptoms and signs involving the circulatory and respiratory systems: Secondary | ICD-10-CM

## 2014-02-21 DIAGNOSIS — R42 Dizziness and giddiness: Secondary | ICD-10-CM

## 2014-02-21 DIAGNOSIS — I70219 Atherosclerosis of native arteries of extremities with intermittent claudication, unspecified extremity: Secondary | ICD-10-CM

## 2014-02-21 DIAGNOSIS — M79609 Pain in unspecified limb: Secondary | ICD-10-CM

## 2014-02-21 NOTE — Progress Notes (Signed)
VASCULAR LAB PRELIMINARY  PRELIMINARY  PRELIMINARY  PRELIMINARY  Carotid duplex and ABI completed.    Preliminary report:   Carotid:  Bilateral:  1-39% ICA stenosis.  Vertebral artery flow is antegrade.     ABI:  ABIs and pedal waveforms within normal limits at rest.  Margie Brink, RVT 02/21/2014, 2:51 PM

## 2014-02-22 ENCOUNTER — Other Ambulatory Visit: Payer: Self-pay | Admitting: Family Medicine

## 2014-03-18 ENCOUNTER — Encounter: Payer: Self-pay | Admitting: Internal Medicine

## 2014-03-18 ENCOUNTER — Ambulatory Visit (INDEPENDENT_AMBULATORY_CARE_PROVIDER_SITE_OTHER): Payer: Medicare Other | Admitting: Internal Medicine

## 2014-03-18 VITALS — BP 130/78 | HR 82 | Temp 97.9°F | Ht 66.0 in | Wt 154.7 lb

## 2014-03-18 DIAGNOSIS — R7402 Elevation of levels of lactic acid dehydrogenase (LDH): Secondary | ICD-10-CM | POA: Diagnosis present

## 2014-03-18 DIAGNOSIS — Z720 Tobacco use: Secondary | ICD-10-CM

## 2014-03-18 DIAGNOSIS — F329 Major depressive disorder, single episode, unspecified: Secondary | ICD-10-CM | POA: Diagnosis not present

## 2014-03-18 DIAGNOSIS — F17200 Nicotine dependence, unspecified, uncomplicated: Secondary | ICD-10-CM

## 2014-03-18 DIAGNOSIS — F3289 Other specified depressive episodes: Secondary | ICD-10-CM | POA: Diagnosis not present

## 2014-03-18 DIAGNOSIS — R7401 Elevation of levels of liver transaminase levels: Secondary | ICD-10-CM | POA: Diagnosis present

## 2014-03-18 NOTE — Progress Notes (Signed)
Patient ID: Marleny Faller, female   DOB: Nov 12, 1956, 58 y.o.   MRN: [id number]    Subjective:   Patient ID: Monette Omara female   DOB: [birth year] 58 y.o.   MRN: [id number]  HPI: Ms.Xylah Tasneem Cormier is a 58 y.o. woman with a pmhx detailed below who comes for a 1 month f/u after starting chantix to stop smoking. She reports that she took chantix for less than a week before stopping it and restarting smoking. She has increased her daily cigarettes to a half ppd. She states that this is due to increase stress in her life. She is dealing with two deaths in her close family. She also is dealing with cleaning out a house of a loved home who was a Ship broker. Apparently the house is condemned and she is sorting through the house to try to find valuable and sentimental items. This has been very strenuous for the patient.     Past Medical History  Diagnosis Date  . Alcohol abuse last February 2012  . Drug abuse last 1990's  . Arthritis   . Collagenous colitis   . Bipolar disorder    Current Outpatient Prescriptions  Medication Sig Dispense Refill  . calcium citrate-vitamin D (CITRACAL+D) 315-200 MG-UNIT per tablet Take 1 tablet by mouth 2 (two) times daily. She takes Calcium Vitamin D 600-800 1 tab BID      . cyclobenzaprine (FLEXERIL) 10 MG tablet TAKE 1 TABLET BY MOUTH 3 TIMES A DAY AS NEEDED FOR MUSCLE SPASM  90 tablet  2  . gabapentin (NEURONTIN) 300 MG capsule TAKE 2 CAPSULES BY MOUTH 3 TIMES A DAY  180 capsule  3  . meloxicam (MOBIC) 15 MG tablet Take 1 tablet (15 mg total) by mouth daily as needed for pain.  30 tablet  2  . QUEtiapine Fumarate (SEROQUEL XR) 150 MG 24 hr tablet Take 150 mg by mouth at bedtime.      . traMADol (ULTRAM) 50 MG tablet TAKE 1 TABLET BY MOUTH EVERY 6 HOURS AS NEEDED FOR PAIN  50 tablet  2  . varenicline (CHANTIX) 1 MG tablet Take 1 tablet (1 mg total) by mouth 2 (two) times daily.  60 tablet  0   No current facility-administered medications for this visit.    Family History  Problem Relation Age of Onset  . Diabetes Father   . Hypertension Father   . Hyperlipidemia Father   . Stroke Mother   . Breast cancer Sister    History   Social History  . Marital Status: Single    Spouse Name: N/A    Number of Children: N/A  . Years of Education: N/A   Social History Main Topics  . Smoking status: Current Some Day Smoker -- 0.50 packs/day    Types: Cigarettes, Cigars  . Smokeless tobacco: Never Used     Comment: smoking cig for 35 years. continues to smoke 1/2 pack daily  . Alcohol Use: Yes     Comment: ETOH abuse for 25 year, 1/2 gallon Liquor daily. admits 6 weeks course of drinking ETOH as her only intake in 2011. she states that she quits her drinnking since Feb 2012.  . Drug Use: No     Comment: pots before, clean since 1995  . Sexual Activity: No   Other Topics Concern  . None   Social History Narrative   She lives with his stepson in [place]. Moved from [place] 2 years ago. Both  Parents lives in [place]. 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 Did not complete High School .       She admits only 2-3 hours daily for 15 years. She does not sleep at nighttime. She would watch TV and read at night time. Always sleep during the day. She has seen a psychiatrist at [facility] and still goes to the group therapy on Mondays. She has an follow up appt with her Psychiatrist on 08/07/12.   Review of Systems: Not performed as the majority of the visit was spent counseling the patient on stress management and smoking cessation.  Objective:  Physical Exam: Filed Vitals:   03/18/14 1016  BP: 130/78  Pulse: 82  Temp: 97.9 F (36.6 C)  TempSrc: Oral  Height: 5\' 6"  (1.676 m)  Weight: 154 lb 11.2 oz (70.171 kg)  SpO2: 94%   Physical Exam  Constitutional: She is oriented to person, place, and time. She appears well-developed and well-nourished. No distress.  HENT:  Head:  Normocephalic.  Mouth/Throat: Oropharynx is clear and moist.  Neck:  Hoarse voice  Cardiovascular: Normal rate, regular rhythm and intact distal pulses.  Exam reveals no friction rub.   No murmur heard. Pulmonary/Chest: Effort normal and breath sounds normal. No respiratory distress. She has no wheezes. She has no rales.  Neurological: She is oriented to person, place, and time.  Skin: She is not diaphoretic.  Psychiatric:  Anxious appearing women. Contracts for safety.     Assessment & Plan:

## 2014-03-18 NOTE — Patient Instructions (Addendum)
I am sorry that you have so much stress in your life. I want to support you with your smoking cessation when you are ready to try quitting again. Please follow up with your councilor at [facility] next week. Please continue to use your personal network to help you during this time of stress. Please contact our clinic if you develop new or worsening symptoms.   Smoking Cessation Quitting smoking is important to your health and has many advantages. However, it is not always easy to quit since nicotine is a very addictive drug. Often times, people try 3 times or more before being able to quit. This document explains the best ways for you to prepare to quit smoking. Quitting takes hard work and a lot of effort, but you can do it. ADVANTAGES OF QUITTING SMOKING  You will live longer, feel better, and live better.  Your body will feel the impact of quitting smoking almost immediately.  Within 20 minutes, blood pressure decreases. Your pulse returns to its normal level.  After 8 hours, carbon monoxide levels in the blood return to normal. Your oxygen level increases.  After 24 hours, the chance of having a heart attack starts to decrease. Your breath, hair, and body stop smelling like smoke.  After 48 hours, damaged nerve endings begin to recover. Your sense of taste and smell improve.  After 72 hours, the body is virtually free of nicotine. Your bronchial tubes relax and breathing becomes easier.  After 2 to 12 weeks, lungs can hold more air. Exercise becomes easier and circulation improves.  The risk of having a heart attack, stroke, cancer, or lung disease is greatly reduced.  After 1 year, the risk of coronary heart disease is cut in half.  After 5 years, the risk of stroke falls to the same as a nonsmoker.  After 10 years, the risk of lung cancer is cut in half and the risk of other cancers decreases significantly.  After 15 years, the risk of coronary heart disease drops, usually to the  level of a nonsmoker.  If you are pregnant, quitting smoking will improve your chances of having a healthy baby.  The people you live with, especially any children, will be healthier.  You will have extra money to spend on things other than cigarettes. QUESTIONS TO THINK ABOUT BEFORE ATTEMPTING TO QUIT You may want to talk about your answers with your caregiver.  Why do you want to quit?  If you tried to quit in the past, what helped and what did not?  What will be the most difficult situations for you after you quit? How will you plan to handle them?  Who can help you through the tough times? Your family? Friends? A caregiver?  What pleasures do you get from smoking? What ways can you still get pleasure if you quit? Here are some questions to ask your caregiver:  How can you help me to be successful at quitting?  What medicine do you think would be best for me and how should I take it?  What should I do if I need more help?  What is smoking withdrawal like? How can I get information on withdrawal? GET READY  Set a quit date.  Change your environment by getting rid of all cigarettes, ashtrays, matches, and lighters in your home, car, or work. Do not let people smoke in your home.  Review your past attempts to quit. Think about what worked and what did not. GET SUPPORT AND ENCOURAGEMENT  You have a better chance of being successful if you have help. You can get support in many ways.  Tell your family, friends, and co-workers that you are going to quit and need their support. Ask them not to smoke around you.  Get individual, group, or telephone counseling and support. Programs are available at [employer] and health centers. Call your local health department for information about programs in your area.  Spiritual beliefs and practices may help some smokers quit.  Download a "quit meter" on your computer to keep track of quit statistics, such as how long you have gone  without smoking, cigarettes not smoked, and money saved.  Get a self-help book about quitting smoking and staying off of tobacco. [place] yourself from urges to smoke. Talk to someone, go for a walk, or occupy your time with a task.  Change your normal routine. Take a different route to work. Drink tea instead of coffee. Eat breakfast in a different place.  Reduce your stress. Take a hot bath, exercise, or read a book.  Plan something enjoyable to do every day. Reward yourself for not smoking.  Explore interactive web-based programs that specialize in helping you quit. GET MEDICINE AND USE IT CORRECTLY Medicines can help you stop smoking and decrease the urge to smoke. Combining medicine with the above behavioral methods and support can greatly increase your chances of successfully quitting smoking.  Nicotine replacement therapy helps deliver nicotine to your body without the negative effects and risks of smoking. Nicotine replacement therapy includes nicotine gum, lozenges, inhalers, nasal sprays, and skin patches. Some may be available over-the-counter and others require a prescription.  Antidepressant medicine helps people abstain from smoking, but how this works is unknown. This medicine is available by prescription.  Nicotinic receptor partial agonist medicine simulates the effect of nicotine in your brain. This medicine is available by prescription. Ask your caregiver for advice about which medicines to use and how to use them based on your health history. Your caregiver will tell you what side effects to look out for if you choose to be on a medicine or therapy. Carefully read the information on the package. Do not use any other product containing nicotine while using a nicotine replacement product.  RELAPSE OR DIFFICULT SITUATIONS Most relapses occur within the first 3 months after quitting. Do not be discouraged if you start smoking again. Remember,  most people try several times before finally quitting. You may have symptoms of withdrawal because your body is used to nicotine. You may crave cigarettes, be irritable, feel very hungry, cough often, get headaches, or have difficulty concentrating. The withdrawal symptoms are only temporary. They are strongest when you first quit, but they will go away within 10 14 days. To reduce the chances of relapse, try to:  Avoid drinking alcohol. Drinking lowers your chances of successfully quitting.  Reduce the amount of caffeine you consume. Once you quit smoking, the amount of caffeine in your body increases and can give you symptoms, such as a rapid heartbeat, sweating, and anxiety.  Avoid smokers because they can make you want to smoke.  Do not let weight gain distract you. Many smokers will gain weight when they quit, usually less than 10 pounds. Eat a healthy diet and stay active. You can always lose the weight gained after you quit.  Find ways to improve your mood other than smoking. FOR MORE INFORMATION  www.smokefree.gov  Document Released: 11/23/2001 Document Revised: 05/30/2012  Document Reviewed: 03/09/2012 [facility] Patient Information 2014 [place].

## 2014-03-18 NOTE — Assessment & Plan Note (Signed)
Patient failed smoking cessation. She has stopped chantix within 1 week of starting. I rec that the patient focus on controlling the stressors in her life. I spent the majority of the session talking with the patient about stress management and how best to take care of herself. The patient was thankful for the time we spent talking. Hopefully, in the future the patient will be ready to try smoking cessation again.

## 2014-03-19 NOTE — Progress Notes (Signed)
Case discussed with Dr. Komanski at time of visit.  We reviewed the resident's history and exam and pertinent patient test results.  I agree with the assessment, diagnosis, and plan of care documented in the resident's note. 

## 2014-03-21 DIAGNOSIS — M533 Sacrococcygeal disorders, not elsewhere classified: Secondary | ICD-10-CM | POA: Diagnosis not present

## 2014-03-22 ENCOUNTER — Other Ambulatory Visit: Payer: Self-pay | Admitting: Family Medicine

## 2014-04-08 DIAGNOSIS — F3189 Other bipolar disorder: Secondary | ICD-10-CM | POA: Diagnosis not present

## 2014-05-02 DIAGNOSIS — H524 Presbyopia: Secondary | ICD-10-CM | POA: Diagnosis not present

## 2014-05-02 DIAGNOSIS — H2510 Age-related nuclear cataract, unspecified eye: Secondary | ICD-10-CM | POA: Diagnosis not present

## 2014-05-02 DIAGNOSIS — H538 Other visual disturbances: Secondary | ICD-10-CM | POA: Diagnosis not present

## 2014-05-16 DIAGNOSIS — M47817 Spondylosis without myelopathy or radiculopathy, lumbosacral region: Secondary | ICD-10-CM | POA: Diagnosis not present

## 2014-05-16 DIAGNOSIS — M533 Sacrococcygeal disorders, not elsewhere classified: Secondary | ICD-10-CM | POA: Diagnosis not present

## 2014-05-17 DIAGNOSIS — F3189 Other bipolar disorder: Secondary | ICD-10-CM | POA: Diagnosis not present

## 2014-05-17 DIAGNOSIS — F308 Other manic episodes: Secondary | ICD-10-CM | POA: Diagnosis not present

## 2014-05-20 ENCOUNTER — Other Ambulatory Visit: Payer: Self-pay | Admitting: Family Medicine

## 2014-05-23 ENCOUNTER — Other Ambulatory Visit (INDEPENDENT_AMBULATORY_CARE_PROVIDER_SITE_OTHER): Payer: Medicare Other | Admitting: *Deleted

## 2014-05-23 DIAGNOSIS — Z23 Encounter for immunization: Secondary | ICD-10-CM

## 2014-05-23 DIAGNOSIS — Z789 Other specified health status: Secondary | ICD-10-CM | POA: Diagnosis not present

## 2014-05-24 ENCOUNTER — Other Ambulatory Visit: Payer: Self-pay | Admitting: Family Medicine

## 2014-06-11 DIAGNOSIS — F3189 Other bipolar disorder: Secondary | ICD-10-CM | POA: Diagnosis not present

## 2014-06-19 ENCOUNTER — Encounter: Payer: Self-pay | Admitting: Sports Medicine

## 2014-06-19 ENCOUNTER — Ambulatory Visit (INDEPENDENT_AMBULATORY_CARE_PROVIDER_SITE_OTHER): Payer: Medicare Other | Admitting: Sports Medicine

## 2014-06-19 VITALS — BP 130/79 | HR 89 | Ht 66.0 in | Wt 145.0 lb

## 2014-06-19 DIAGNOSIS — M549 Dorsalgia, unspecified: Secondary | ICD-10-CM

## 2014-06-19 DIAGNOSIS — G8929 Other chronic pain: Secondary | ICD-10-CM

## 2014-06-19 DIAGNOSIS — M79604 Pain in right leg: Secondary | ICD-10-CM

## 2014-06-19 DIAGNOSIS — M545 Low back pain, unspecified: Secondary | ICD-10-CM

## 2014-06-19 MED ORDER — MELOXICAM 15 MG PO TABS: 15.0000 mg | ORAL_TABLET | Freq: Every day | ORAL | Status: DC | PRN

## 2014-06-19 MED ORDER — GABAPENTIN 300 MG PO CAPS: 600.0000 mg | ORAL_CAPSULE | Freq: Three times a day (TID) | ORAL | Status: DC

## 2014-06-19 MED ORDER — TRAMADOL HCL 50 MG PO TABS: 50.0000 mg | ORAL_TABLET | Freq: Two times a day (BID) | ORAL | Status: DC | PRN

## 2014-06-19 NOTE — Progress Notes (Signed)
  Monique Perry - 58 y.o. female MRN [id number]  Date of birth: 12-01-1956  SUBJECTIVE:  Including CC & ROS.  Low Back Pain Follow Up: Patient returns today requesting refills on her medication for her chronic back pain.  She is currently under the care of Dr. Jacelyn Grip at [facility] and scheduled to undergo a procedure tomorrow; she has had minimal improvement with prior injections.  She reports overall her pain is stable when on medications.  She has been out of meloxicam for one week as well as Neurontin and reports her symptoms since that time have been worsening. Pt denies any new or different radicular symptoms, change in bowel or bladder habits, muscle weakness or falls associated with back pain.  No fevers, chills, night sweats or weight loss.     HISTORY: Past Medical, Surgical, Social, and Family History Reviewed & Updated per EMR. Pertinent Historical Findings include: Internal medicine clinic patient Bipolar disorder history of drug abuse, depression.  PVD with intermittent claudication.  Hypertension Ma Rings a smoker.  History of alcoholism. Her cervical radiculitis  DATA REVIEWED: Prior notes MRI 04/05/2013: Significant L4-L5 and L5-S1 degeneration with mild to moderate L5 foraminal stenosis worse on the right.  PHYSICAL EXAM:  VS: BP:130/79 mmHg  HR:89bpm  TEMP: ( )  RESP:   HT:5\' 6"  (167.6 cm)   WT:145 lb (65.772 kg)  BMI:23.5 PHYSICAL EXAM: GENERAL:  Adult Caucasian female. In no discomfort; no respiratory distress  PSYCH:  alert and appropriate Back Exam:        Gen/Palp:  Patient is tender to palpation of her bilateral SI joints.  There is no midline tenderness.  ROM:  She is limited hamstring flexibility to 60 bilaterally.  Bilateral hips with internal rotation to 20 NV:  The bilateral Lower Extremity myotomes are 5+/5.  Sensation is grossly intact to light sensation in bilateral Lower Extremity dermatomes.  Bilateral lower DTRs 1+ out of 4 Tests:  Bilateral  negative straight leg raises.    ASSESSMENT & PLAN: See problem based charting & AVS for pt instructions.

## 2014-06-19 NOTE — Patient Instructions (Signed)
It was nice to meet you today.  We are getting the one additional month of refills on your Mobic, tramadol and Neurontin.  These medications need to be coming from either your primary care physician or from your back pain specialist.

## 2014-06-20 DIAGNOSIS — M533 Sacrococcygeal disorders, not elsewhere classified: Secondary | ICD-10-CM | POA: Diagnosis not present

## 2014-06-20 DIAGNOSIS — M47817 Spondylosis without myelopathy or radiculopathy, lumbosacral region: Secondary | ICD-10-CM | POA: Diagnosis not present

## 2014-06-20 NOTE — Assessment & Plan Note (Signed)
Assessment & Plan & Follow up Issues:  Encounter Diagnoses  Name Primary?  . Low back pain radiating to right leg Yes  . Chronic back pain    Chronic stable conditions  - patient reports overall significant improvement with her previous regimen of Neurontin, Mobic, Flexeril and and tramadol. 1. Refills requested for Neurontin, Mobic and tramadol.  Provided today.  Discussed this will be the last prescription from sports medicine for these medications as she has other providers are more appropriate to provide these including her primary care physician or back pain specialist.  She has been doing well with these medications and she seems to be using them appropriately.  We are happy to see her back for new or different symptoms we'll defer to her other physicians for her chronic ongoing conditions.

## 2014-06-21 ENCOUNTER — Other Ambulatory Visit: Payer: Self-pay | Admitting: Family Medicine

## 2014-06-28 ENCOUNTER — Other Ambulatory Visit: Payer: Self-pay | Admitting: Family Medicine

## 2014-07-03 DIAGNOSIS — F17200 Nicotine dependence, unspecified, uncomplicated: Secondary | ICD-10-CM | POA: Diagnosis not present

## 2014-07-03 DIAGNOSIS — M533 Sacrococcygeal disorders, not elsewhere classified: Secondary | ICD-10-CM | POA: Diagnosis not present

## 2014-07-06 ENCOUNTER — Other Ambulatory Visit: Payer: Self-pay | Admitting: Family Medicine

## 2014-07-15 DIAGNOSIS — F3189 Other bipolar disorder: Secondary | ICD-10-CM | POA: Diagnosis not present

## 2014-07-19 ENCOUNTER — Other Ambulatory Visit: Payer: Self-pay | Admitting: Family Medicine

## 2014-07-25 DIAGNOSIS — M47817 Spondylosis without myelopathy or radiculopathy, lumbosacral region: Secondary | ICD-10-CM | POA: Diagnosis not present

## 2014-07-25 DIAGNOSIS — M533 Sacrococcygeal disorders, not elsewhere classified: Secondary | ICD-10-CM | POA: Diagnosis not present

## 2014-07-27 ENCOUNTER — Other Ambulatory Visit: Payer: Self-pay | Admitting: Family Medicine

## 2014-08-09 ENCOUNTER — Other Ambulatory Visit: Payer: Self-pay | Admitting: Family Medicine

## 2014-08-09 DIAGNOSIS — M533 Sacrococcygeal disorders, not elsewhere classified: Secondary | ICD-10-CM | POA: Diagnosis not present

## 2014-08-14 DIAGNOSIS — Z23 Encounter for immunization: Secondary | ICD-10-CM | POA: Diagnosis not present

## 2014-08-22 ENCOUNTER — Encounter: Payer: Self-pay | Admitting: Internal Medicine

## 2014-08-22 ENCOUNTER — Encounter: Payer: Self-pay | Admitting: *Deleted

## 2014-08-23 NOTE — Telephone Encounter (Signed)
Patient had flu shot 08/14/14 at [facility] by Ramond Dial.  Afluria mfg CSL Biotherapie, Lot #[id number] Volume (ml) 0.50.

## 2014-09-30 ENCOUNTER — Encounter: Payer: Self-pay | Admitting: Internal Medicine

## 2014-10-04 ENCOUNTER — Other Ambulatory Visit: Payer: Self-pay | Admitting: Internal Medicine

## 2014-10-04 DIAGNOSIS — Z1231 Encounter for screening mammogram for malignant neoplasm of breast: Secondary | ICD-10-CM

## 2014-10-07 DIAGNOSIS — F3181 Bipolar II disorder: Secondary | ICD-10-CM | POA: Diagnosis not present

## 2014-10-14 ENCOUNTER — Encounter: Payer: Self-pay | Admitting: Sports Medicine

## 2014-10-16 ENCOUNTER — Other Ambulatory Visit: Payer: Self-pay | Admitting: Sports Medicine

## 2014-10-16 MED ORDER — CYCLOBENZAPRINE HCL 10 MG PO TABS: 10.0000 mg | ORAL_TABLET | Freq: Three times a day (TID) | ORAL | Status: DC | PRN

## 2014-11-01 ENCOUNTER — Ambulatory Visit (HOSPITAL_COMMUNITY)
Admission: RE | Admit: 2014-11-01 | Discharge: 2014-11-01 | Disposition: A | Discharge status: Home / Self Care | Payer: Medicare Other | Source: Ambulatory Visit | Attending: Internal Medicine | Admitting: Internal Medicine

## 2014-11-01 ENCOUNTER — Other Ambulatory Visit: Payer: Self-pay | Admitting: Internal Medicine

## 2014-11-01 ENCOUNTER — Ambulatory Visit (INDEPENDENT_AMBULATORY_CARE_PROVIDER_SITE_OTHER): Payer: Medicare Other | Admitting: Internal Medicine

## 2014-11-01 VITALS — BP 122/56 | HR 88 | Temp 98.3°F | Ht 66.0 in | Wt 146.4 lb

## 2014-11-01 DIAGNOSIS — F3170 Bipolar disorder, currently in remission, most recent episode unspecified: Secondary | ICD-10-CM

## 2014-11-01 DIAGNOSIS — Z1231 Encounter for screening mammogram for malignant neoplasm of breast: Secondary | ICD-10-CM

## 2014-11-01 DIAGNOSIS — R928 Other abnormal and inconclusive findings on diagnostic imaging of breast: Secondary | ICD-10-CM | POA: Insufficient documentation

## 2014-11-01 DIAGNOSIS — Z720 Tobacco use: Secondary | ICD-10-CM | POA: Diagnosis not present

## 2014-11-01 DIAGNOSIS — M542 Cervicalgia: Secondary | ICD-10-CM | POA: Diagnosis not present

## 2014-11-01 DIAGNOSIS — M549 Dorsalgia, unspecified: Secondary | ICD-10-CM | POA: Diagnosis not present

## 2014-11-01 DIAGNOSIS — G8929 Other chronic pain: Secondary | ICD-10-CM

## 2014-11-01 MED ORDER — KETOROLAC TROMETHAMINE 30 MG/ML IM SOLN
30.0000 mg | Freq: Once | INTRAMUSCULAR | Status: DC
Start: 2014-11-01 — End: 2014-11-01

## 2014-11-01 MED ORDER — KETOROLAC TROMETHAMINE 30 MG/ML IJ SOLN
30.0000 mg | Freq: Once | INTRAMUSCULAR | Status: AC
  Administered 2014-11-01: 30 mg via INTRAMUSCULAR

## 2014-11-01 MED ORDER — VARENICLINE TARTRATE 0.5 MG X 11 & 1 MG X 42 PO MISC: ORAL | Status: DC

## 2014-11-01 NOTE — Progress Notes (Signed)
Medicine attending: I personally examined and interviewed this patient together with resident physician Dr. Karle Starch Moding and I concur with his evaluation and management plan.

## 2014-11-01 NOTE — Progress Notes (Signed)
   Subjective:    Patient ID: Monique Perry, female    DOB: 1956-11-02, 58 y.o.   MRN: [id number]  HPI Monique Perry is a 58 year old woman with history of polysubstance abuse, chronic back pain, and bipolar disorder presenting for routine follow-up.  Monique Perry reports pain and burning in her neck that hasn't responded to Tylenol, Advil, or ice.  She says the neck pain has been off and on 5 or 6 times over the last year, but she has had it for 5 days.  She says that her back pain has been stable and responsive to Flexeril and gabapentin.  She follows with [facility] for her back pain.  She says her mood is stable, and she has been following with [facility] who increased her Seroquel to 400 mg QHS.  She continues to smoke and tried using Chantix in the past, but was unsuccessful due to stresses in her life.  She is currently smoking 1/2 ppd and would like to try to quit again.  She received a mammogram this morning.   Review of Systems  Constitutional: Negative for fever, chills and diaphoresis.  HENT: Negative for congestion and rhinorrhea.   Respiratory: Negative for cough, shortness of breath and wheezing.   Cardiovascular: Negative for chest pain and palpitations.  Gastrointestinal: Negative for nausea, vomiting, abdominal pain, diarrhea and constipation.  Genitourinary: Negative for dysuria and difficulty urinating.  Musculoskeletal: Positive for back pain and neck pain.  Skin: Negative for pallor and rash.  Neurological: Negative for dizziness, syncope, light-headedness and numbness.  Psychiatric/Behavioral: Negative for dysphoric mood and agitation.       Objective:   Physical Exam  Constitutional: She is oriented to person, place, and time. She appears well-developed and well-nourished. No distress.  HENT:  Head: Normocephalic and atraumatic.  Eyes: Conjunctivae and EOM are normal. Pupils are equal, round, and reactive to light. No scleral icterus.  Neck:  Normal range of motion.  Tense tender muscle on left neck and shoulder.  Cardiovascular: Normal rate, regular rhythm and normal heart sounds.   Pulmonary/Chest: Effort normal and breath sounds normal.  Abdominal: Soft. Bowel sounds are normal. She exhibits no distension. There is no tenderness.  Musculoskeletal: Normal range of motion. She exhibits no edema.  Neurological: She is alert and oriented to person, place, and time. No cranial nerve deficit. She exhibits normal muscle tone.  Skin: Skin is warm and dry. No rash noted. No erythema.          Assessment & Plan:  Please see problem-based assessment and plan.

## 2014-11-01 NOTE — Patient Instructions (Signed)
Thank you for coming to clinic today Monique Perry.  General instructions: -You received a Toradol injection for your neck pain.  You can continue to take ibuprofen, flexeril, and gabapentin to help with your neck pain. -Let us know if your neck pain worsens or does not go away in couple of weeks. -Make sure to follow up with orthopedics who may be able to help with your neck pain as well. -I wrote you a prescription for Chantix.  Start taking it one week before you plan to quit smoking. -Please make a follow up appointment to return to clinic in 6 months.  Please try to bring all your medicines next time. This helps [place] take good care of you and stops mistakes from medicines that could hurt you.

## 2014-11-02 ENCOUNTER — Encounter: Payer: Self-pay | Admitting: Internal Medicine

## 2014-11-02 DIAGNOSIS — M542 Cervicalgia: Secondary | ICD-10-CM | POA: Insufficient documentation

## 2014-11-02 NOTE — Assessment & Plan Note (Signed)
Doing well on increased dose of Seroquel. -Continue to follow with [facility]. -Continue Seroquel 400 mg QHS.

## 2014-11-02 NOTE — Assessment & Plan Note (Signed)
Stable currently.  Follow with [facility] who prescribes her pain medications. Says she does not take Mobic because it does not help. -Continue Flexeril and gabapentin. -Follow up with Ortho.

## 2014-11-02 NOTE — Assessment & Plan Note (Signed)
Intermittent left neck pain with tense and tender muscles.  This has resolved on its own in the past, and it appears to be musculoskeletal without any associated weakness or numbness. -Continue Flexeril and gabapentin which she takes for her back pain. This will likely help with neck pain. -Toradol 30 mg injection in clinic today. -Continue to follow up with orthopedics for back pain as they may have additional suggestions. -Return to clinic if pain worsens or does not resolve in a couple of weeks.

## 2014-11-02 NOTE — Assessment & Plan Note (Signed)
Has tried Wellbutrin, patches, and Chantix in past.  Very motivated to quit smoking.  Going through life stressors when last tried Chantix, so would like to try again. -Encouraged patient that it often takes multiple tries to quit smoking. -Repeat trial of Chantix.

## 2014-11-03 ENCOUNTER — Encounter: Payer: Self-pay | Admitting: Internal Medicine

## 2014-11-06 ENCOUNTER — Other Ambulatory Visit: Payer: Self-pay | Admitting: Internal Medicine

## 2014-11-06 DIAGNOSIS — R928 Other abnormal and inconclusive findings on diagnostic imaging of breast: Secondary | ICD-10-CM

## 2014-11-13 DIAGNOSIS — F3181 Bipolar II disorder: Secondary | ICD-10-CM | POA: Diagnosis not present

## 2014-11-20 ENCOUNTER — Other Ambulatory Visit: Payer: Self-pay | Admitting: Internal Medicine

## 2014-11-20 ENCOUNTER — Other Ambulatory Visit: Payer: Self-pay

## 2014-11-20 DIAGNOSIS — R928 Other abnormal and inconclusive findings on diagnostic imaging of breast: Secondary | ICD-10-CM

## 2014-11-25 ENCOUNTER — Ambulatory Visit
Admission: RE | Admit: 2014-11-25 | Discharge: 2014-11-25 | Disposition: A | Discharge status: Home / Self Care | Payer: Medicare Other | Source: Ambulatory Visit | Attending: Internal Medicine | Admitting: Internal Medicine

## 2014-11-25 DIAGNOSIS — N6489 Other specified disorders of breast: Secondary | ICD-10-CM | POA: Diagnosis not present

## 2014-11-25 DIAGNOSIS — R928 Other abnormal and inconclusive findings on diagnostic imaging of breast: Secondary | ICD-10-CM

## 2014-11-26 ENCOUNTER — Encounter: Payer: Self-pay | Admitting: Internal Medicine

## 2014-11-26 ENCOUNTER — Ambulatory Visit (INDEPENDENT_AMBULATORY_CARE_PROVIDER_SITE_OTHER): Payer: Medicare Other | Admitting: Internal Medicine

## 2014-11-26 VITALS — BP 128/81 | HR 96 | Temp 97.0°F | Wt 143.6 lb

## 2014-11-26 DIAGNOSIS — M542 Cervicalgia: Secondary | ICD-10-CM

## 2014-11-26 MED ORDER — KETOROLAC TROMETHAMINE 30 MG/ML IJ SOLN
30.0000 mg | Freq: Once | INTRAMUSCULAR | Status: AC
  Administered 2014-11-26: 30 mg via INTRAMUSCULAR

## 2014-11-26 MED ORDER — KETOROLAC TROMETHAMINE 30 MG/ML IJ SOLN: 30.0000 mg | Freq: Once | INTRAMUSCULAR | Status: DC

## 2014-11-26 NOTE — Patient Instructions (Addendum)
**  Continue taking the Flexeril and the Gabapentin.  **Use warm compresses on your neck and try stretching and massages to help loosen the muscles in your neck. **Be sure to follow up with your Orthopedist  General Instructions:   Please bring your medicines with you each time you come to clinic.  Medicines may include prescription medications, over-the-counter medications, herbal remedies, eye drops, vitamins, or other pills.   Progress Toward Treatment Goals:  Treatment Goal 11/12/2013  Blood pressure at goal  Stop smoking smoking less  Prevent falls improved    Self Care Goals & Plans:  Self Care Goal 03/18/2014  Manage my medications take my medicines as prescribed; bring my medications to every visit; refill my medications on time  Monitor my health -  Eat healthy foods drink diet soda or water instead of juice or soda; eat more vegetables; eat foods that are low in salt; eat baked foods instead of fried foods  Be physically active -  Stop smoking -  Prevent falls -  Meeting treatment goals -    No flowsheet data found.   Care Management & Community Referrals:  Referral 11/12/2013  Referrals made for care management support none needed

## 2014-11-26 NOTE — Progress Notes (Signed)
Patient ID: Monique Perry, female   DOB: [birth year], 58 y.o.   MRN: [id number]  Subjective:   Patient ID: Monique Perry female   DOB: Aug 19, 1956 58 y.o.   MRN: [id number]  HPI: Monique Perry is a 58 y.o. F w/ PMH polysubstance abuse, chronic back pain, and bipolar disorder presents with persistent neck pain.   She was seen by her PCP 11/01/14 and was advised to use Flexeril and gabapentin for her chronic musculoskeletal pain. She was given a Toradol injection in the clinic as well and was asked to f/u with her Orthopedist. She presents today with pain in her left neck and shoulder. She is having neck stiffness 2/2 pain. She states that she has tried OTC medications w/o relief. Pain 11/10 and it makes her feel nauseous.   She has not followed up with her Orthopedist since seeing Dr. Trudee Kuster.    Past Medical History  Diagnosis Date  . Alcohol abuse last February 2012  . Drug abuse last 1990's  . Arthritis   . Collagenous colitis   . Bipolar disorder    Current Outpatient Prescriptions  Medication Sig Dispense Refill  . calcium citrate-vitamin D (CITRACAL+D) 315-200 MG-UNIT per tablet Take 1 tablet by mouth 2 (two) times daily. She takes Calcium Vitamin D 600-800 1 tab BID    . cyclobenzaprine (FLEXERIL) 10 MG tablet TAKE 1 TABLET BY MOUTH 3 TIMES A DAY AS NEEDED FOR MUSCLE SPASM 90 tablet 2  . gabapentin (NEURONTIN) 300 MG capsule TAKE 2 CAPSULES BY MOUTH 3 TIMES A DAY 180 capsule 3  . QUEtiapine Fumarate (SEROQUEL XR) 150 MG 24 hr tablet Take 400 mg by mouth at bedtime.     . varenicline (CHANTIX PAK) 0.5 MG X 11 & 1 MG X 42 tablet Take one 0.5 mg tablet by mouth once daily for 3 days, then increase to one 0.5 mg tablet BID for 4 days, then increase to one 1 mg BID. 53 tablet 0   No current facility-administered medications for this visit.   Family History  Problem Relation Age of Onset  . Diabetes Father   . Hypertension Father   . Hyperlipidemia Father   . Stroke Mother    . Breast cancer Sister    History   Social History  . Marital Status: Single    Spouse Name: N/A    Number of Children: N/A  . Years of Education: N/A   Social History Main Topics  . Smoking status: Current Some Day Smoker -- 0.50 packs/day    Types: Cigarettes, Cigars  . Smokeless tobacco: Never Used     Comment: smoking cig for 35 years. continues to smoke 1/2 pack daily  . Alcohol Use: 0.0 oz/week    0 Not specified per week     Comment: ETOH abuse for 25 year, 1/2 gallon Liquor daily. admits 6 weeks course of drinking ETOH as her only intake in 2011. she states that she quits her drinnking since Feb 2012.  . Drug Use: No     Comment: pots before, clean since 1995  . Sexual Activity: No   Other Topics Concern  . None   Social History Narrative   She lives with his stepson in [place]. Moved from [place] 2 years ago. Both  Parents lives in [place]. 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 Did not complete High School .       She admits only 2-3 hours  daily for 15 years. She does not sleep at nighttime. She would watch TV and read at night time. Always sleep during the day. She has seen a psychiatrist at [facility] and still goes to the group therapy on Mondays. She has an follow up appt with her Psychiatrist on 08/07/12.   Review of Systems: Constitutional: Denies fever, chills.Marland Kitchen  HEENT: +neck pain, neck stiffness.   Respiratory: Denies SOB, DOE Cardiovascular: Denies chest pain Gastrointestinal: +nausea. Denies vomiting Genitourinary: Denies dysuria, urgency, frequency Musculoskeletal: +back pain Neurological: Denies syncope Psychiatric/Behavioral: Denies confusion. +increased stressors  Objective:  Physical Exam: Filed Vitals:   11/26/14 0848  BP: 128/81  Pulse: 96  Temp: 97 F (36.1 C)  TempSrc: Oral  Weight: 143 lb 9.6 oz (65.137 kg)  SpO2: 98%   Constitutional: Vital signs reviewed.  Patient is a  cachectic female in no acute distress and cooperative with exam. Alert and oriented x3.  Head: Normocephalic and atraumatic Eyes: PERRL, EOMI Neck: Supple. Trachea midline, normal ROM, No JVD, mass, thyromegaly. Does have pain with ROM exam of the neck and shoulder. Cardiovascular: Mildly tachycardic. Pulmonary/Chest: Normal respiratory effort Abdominal: Non-distended. Musculoskeletal: No joint deformities. Moves all 4 extremities Hematology: No cervical adenopathy.  Neurological: A&O x3, cranial nerve II-XII are grossly intact, no focal motor deficit.  Skin: Warm, dry and intact.   Psychiatric: Normal mood and affect. Speech and behavior is normal.   Assessment & Plan:   Please refer to Problem List based Assessment and Plan  Pt states that she would like to see the same physician each time and not see different physicians. She is going to look for a PCP outside of the [facility] Medicine Clinic.

## 2014-11-26 NOTE — Assessment & Plan Note (Addendum)
Persistent left neck pain, which seems to be muscular in nature. She has full ROM of the neck and her pain is improved with hot/cold packs and massages and stretching. She is on gabapentin and flexeril and has been for a number of years and does not feel that she gets sufficient relief from these. She is requesting stronger pain medication than tramadol, which she states does not help her pain. In the past she has seen her Orthopedist at [facility] and has receive pain medication from there. She was asked to f/u with Ortho by her PCP last month but has not.  - Toradol 30mg  IM given in the clinic - Continue the Flexeril and gabapentin - Pt advised to f/u with her Orthopedist - Recommended to continue hot packs, massages, and stretching for her pain.

## 2014-11-28 NOTE — Progress Notes (Signed)
Internal Medicine Clinic Attending  Case discussed with Dr. Glenn soon after the resident saw the patient.  We reviewed the resident's history and exam and pertinent patient test results.  I agree with the assessment, diagnosis, and plan of care documented in the resident's note. 

## 2014-12-03 ENCOUNTER — Telehealth: Payer: Self-pay | Admitting: *Deleted

## 2014-12-03 NOTE — Telephone Encounter (Signed)
Contacted pt's insurance at [phone] to initiate PA request on pt's Chantix-dx tobacco abuse.  Request was approved until June 2016-will inform pharmacy     PA Auth # [id number]

## 2014-12-28 ENCOUNTER — Other Ambulatory Visit: Payer: Self-pay | Admitting: Internal Medicine

## 2015-01-20 ENCOUNTER — Encounter: Payer: Self-pay | Admitting: Internal Medicine

## 2015-01-20 DIAGNOSIS — Z0000 Encounter for general adult medical examination without abnormal findings: Secondary | ICD-10-CM | POA: Diagnosis not present

## 2015-01-20 DIAGNOSIS — R0982 Postnasal drip: Secondary | ICD-10-CM | POA: Diagnosis not present

## 2015-01-20 DIAGNOSIS — F319 Bipolar disorder, unspecified: Secondary | ICD-10-CM | POA: Diagnosis not present

## 2015-01-20 DIAGNOSIS — J309 Allergic rhinitis, unspecified: Secondary | ICD-10-CM | POA: Diagnosis not present

## 2015-01-20 DIAGNOSIS — Z131 Encounter for screening for diabetes mellitus: Secondary | ICD-10-CM | POA: Diagnosis not present

## 2015-01-20 DIAGNOSIS — M8580 Other specified disorders of bone density and structure, unspecified site: Secondary | ICD-10-CM | POA: Diagnosis not present

## 2015-01-20 DIAGNOSIS — Z13220 Encounter for screening for lipoid disorders: Secondary | ICD-10-CM | POA: Diagnosis not present

## 2015-01-20 DIAGNOSIS — E782 Mixed hyperlipidemia: Secondary | ICD-10-CM | POA: Diagnosis not present

## 2015-01-20 DIAGNOSIS — M5136 Other intervertebral disc degeneration, lumbar region: Secondary | ICD-10-CM | POA: Diagnosis not present

## 2015-01-20 DIAGNOSIS — F419 Anxiety disorder, unspecified: Secondary | ICD-10-CM | POA: Diagnosis not present

## 2015-01-21 DIAGNOSIS — F3181 Bipolar II disorder: Secondary | ICD-10-CM | POA: Diagnosis not present

## 2015-02-18 ENCOUNTER — Other Ambulatory Visit: Payer: Self-pay | Admitting: Internal Medicine

## 2015-02-19 NOTE — Telephone Encounter (Signed)
Transferred to [employer] at [facility]

## 2015-03-24 DIAGNOSIS — F3181 Bipolar II disorder: Secondary | ICD-10-CM | POA: Diagnosis not present

## 2015-04-07 DIAGNOSIS — R0600 Dyspnea, unspecified: Secondary | ICD-10-CM | POA: Diagnosis not present

## 2015-04-07 DIAGNOSIS — M79669 Pain in unspecified lower leg: Secondary | ICD-10-CM | POA: Diagnosis not present

## 2015-04-07 DIAGNOSIS — L309 Dermatitis, unspecified: Secondary | ICD-10-CM | POA: Diagnosis not present

## 2015-04-08 ENCOUNTER — Other Ambulatory Visit: Payer: Self-pay | Admitting: Family Medicine

## 2015-04-08 ENCOUNTER — Ambulatory Visit
Admission: RE | Admit: 2015-04-08 | Discharge: 2015-04-08 | Disposition: A | Discharge status: Home / Self Care | Payer: Medicare Other | Source: Ambulatory Visit | Attending: Family Medicine | Admitting: Family Medicine

## 2015-04-08 DIAGNOSIS — I700 Atherosclerosis of aorta: Secondary | ICD-10-CM | POA: Diagnosis not present

## 2015-04-08 DIAGNOSIS — R0600 Dyspnea, unspecified: Secondary | ICD-10-CM

## 2015-04-08 DIAGNOSIS — R791 Abnormal coagulation profile: Secondary | ICD-10-CM | POA: Diagnosis not present

## 2015-04-08 DIAGNOSIS — R7989 Other specified abnormal findings of blood chemistry: Secondary | ICD-10-CM

## 2015-04-08 DIAGNOSIS — R0602 Shortness of breath: Secondary | ICD-10-CM | POA: Diagnosis not present

## 2015-04-08 MED ORDER — IOHEXOL 350 MG/ML SOLN
75.0000 mL | Freq: Once | INTRAVENOUS | Status: AC | PRN
  Administered 2015-04-08: 75 mL via INTRAVENOUS

## 2015-04-11 ENCOUNTER — Encounter: Payer: Self-pay | Admitting: *Deleted

## 2015-05-05 DIAGNOSIS — M533 Sacrococcygeal disorders, not elsewhere classified: Secondary | ICD-10-CM | POA: Diagnosis not present

## 2015-05-05 DIAGNOSIS — H538 Other visual disturbances: Secondary | ICD-10-CM | POA: Diagnosis not present

## 2015-05-05 DIAGNOSIS — H524 Presbyopia: Secondary | ICD-10-CM | POA: Diagnosis not present

## 2015-05-05 DIAGNOSIS — F17200 Nicotine dependence, unspecified, uncomplicated: Secondary | ICD-10-CM | POA: Diagnosis not present

## 2015-05-05 DIAGNOSIS — H2513 Age-related nuclear cataract, bilateral: Secondary | ICD-10-CM | POA: Diagnosis not present

## 2015-05-05 DIAGNOSIS — M25561 Pain in right knee: Secondary | ICD-10-CM | POA: Diagnosis not present

## 2015-05-09 ENCOUNTER — Other Ambulatory Visit: Payer: Self-pay | Admitting: Orthopedic Surgery

## 2015-05-09 DIAGNOSIS — G8929 Other chronic pain: Secondary | ICD-10-CM

## 2015-05-09 DIAGNOSIS — M533 Sacrococcygeal disorders, not elsewhere classified: Principal | ICD-10-CM

## 2015-05-20 ENCOUNTER — Ambulatory Visit
Admission: RE | Admit: 2015-05-20 | Discharge: 2015-05-20 | Disposition: A | Discharge status: Home / Self Care | Payer: Medicare Other | Source: Ambulatory Visit | Attending: Orthopedic Surgery | Admitting: Orthopedic Surgery

## 2015-05-20 DIAGNOSIS — M533 Sacrococcygeal disorders, not elsewhere classified: Principal | ICD-10-CM

## 2015-05-20 DIAGNOSIS — G8929 Other chronic pain: Secondary | ICD-10-CM

## 2015-05-20 DIAGNOSIS — M461 Sacroiliitis, not elsewhere classified: Secondary | ICD-10-CM | POA: Diagnosis not present

## 2015-05-20 DIAGNOSIS — R102 Pelvic and perineal pain: Secondary | ICD-10-CM | POA: Diagnosis not present

## 2015-05-30 DIAGNOSIS — M533 Sacrococcygeal disorders, not elsewhere classified: Secondary | ICD-10-CM | POA: Diagnosis not present

## 2015-06-06 DIAGNOSIS — M542 Cervicalgia: Secondary | ICD-10-CM | POA: Diagnosis not present

## 2015-06-10 DIAGNOSIS — F3181 Bipolar II disorder: Secondary | ICD-10-CM | POA: Diagnosis not present

## 2015-06-11 DIAGNOSIS — M542 Cervicalgia: Secondary | ICD-10-CM | POA: Diagnosis not present

## 2015-06-13 DIAGNOSIS — M2141 Flat foot [pes planus] (acquired), right foot: Secondary | ICD-10-CM | POA: Diagnosis not present

## 2015-06-13 DIAGNOSIS — M79671 Pain in right foot: Secondary | ICD-10-CM | POA: Diagnosis not present

## 2015-06-13 DIAGNOSIS — M2241 Chondromalacia patellae, right knee: Secondary | ICD-10-CM | POA: Diagnosis not present

## 2015-07-16 DIAGNOSIS — M25561 Pain in right knee: Secondary | ICD-10-CM | POA: Diagnosis not present

## 2015-07-18 ENCOUNTER — Other Ambulatory Visit: Payer: Self-pay | Admitting: Orthopaedic Surgery

## 2015-07-18 DIAGNOSIS — M25561 Pain in right knee: Secondary | ICD-10-CM

## 2015-07-24 ENCOUNTER — Ambulatory Visit
Admission: RE | Admit: 2015-07-24 | Discharge: 2015-07-24 | Disposition: A | Discharge status: Home / Self Care | Payer: Medicare Other | Source: Ambulatory Visit | Attending: Orthopaedic Surgery | Admitting: Orthopaedic Surgery

## 2015-07-24 DIAGNOSIS — M25561 Pain in right knee: Secondary | ICD-10-CM

## 2015-07-24 DIAGNOSIS — M2241 Chondromalacia patellae, right knee: Secondary | ICD-10-CM | POA: Diagnosis not present

## 2015-07-24 DIAGNOSIS — M1711 Unilateral primary osteoarthritis, right knee: Secondary | ICD-10-CM | POA: Diagnosis not present

## 2015-07-25 DIAGNOSIS — Z23 Encounter for immunization: Secondary | ICD-10-CM | POA: Diagnosis not present

## 2015-08-04 DIAGNOSIS — M25561 Pain in right knee: Secondary | ICD-10-CM | POA: Diagnosis not present

## 2015-08-21 DIAGNOSIS — M2241 Chondromalacia patellae, right knee: Secondary | ICD-10-CM | POA: Diagnosis not present

## 2015-08-21 DIAGNOSIS — M23300 Other meniscus derangements, unspecified lateral meniscus, right knee: Secondary | ICD-10-CM | POA: Diagnosis not present

## 2015-08-21 DIAGNOSIS — S83271A Complex tear of lateral meniscus, current injury, right knee, initial encounter: Secondary | ICD-10-CM | POA: Diagnosis not present

## 2015-08-21 DIAGNOSIS — M94261 Chondromalacia, right knee: Secondary | ICD-10-CM | POA: Diagnosis not present

## 2015-08-21 DIAGNOSIS — G8918 Other acute postprocedural pain: Secondary | ICD-10-CM | POA: Diagnosis not present

## 2015-08-27 DIAGNOSIS — M25561 Pain in right knee: Secondary | ICD-10-CM | POA: Diagnosis not present

## 2015-08-27 DIAGNOSIS — M25661 Stiffness of right knee, not elsewhere classified: Secondary | ICD-10-CM | POA: Diagnosis not present

## 2015-08-27 DIAGNOSIS — R262 Difficulty in walking, not elsewhere classified: Secondary | ICD-10-CM | POA: Diagnosis not present

## 2015-09-01 DIAGNOSIS — M25561 Pain in right knee: Secondary | ICD-10-CM | POA: Diagnosis not present

## 2015-09-01 DIAGNOSIS — R262 Difficulty in walking, not elsewhere classified: Secondary | ICD-10-CM | POA: Diagnosis not present

## 2015-09-01 DIAGNOSIS — M25661 Stiffness of right knee, not elsewhere classified: Secondary | ICD-10-CM | POA: Diagnosis not present

## 2015-09-03 DIAGNOSIS — M25561 Pain in right knee: Secondary | ICD-10-CM | POA: Diagnosis not present

## 2015-09-03 DIAGNOSIS — M25661 Stiffness of right knee, not elsewhere classified: Secondary | ICD-10-CM | POA: Diagnosis not present

## 2015-09-03 DIAGNOSIS — R262 Difficulty in walking, not elsewhere classified: Secondary | ICD-10-CM | POA: Diagnosis not present

## 2015-09-08 DIAGNOSIS — M25561 Pain in right knee: Secondary | ICD-10-CM | POA: Diagnosis not present

## 2015-09-08 DIAGNOSIS — M25661 Stiffness of right knee, not elsewhere classified: Secondary | ICD-10-CM | POA: Diagnosis not present

## 2015-09-08 DIAGNOSIS — R262 Difficulty in walking, not elsewhere classified: Secondary | ICD-10-CM | POA: Diagnosis not present

## 2015-09-10 DIAGNOSIS — M25661 Stiffness of right knee, not elsewhere classified: Secondary | ICD-10-CM | POA: Diagnosis not present

## 2015-09-10 DIAGNOSIS — R262 Difficulty in walking, not elsewhere classified: Secondary | ICD-10-CM | POA: Diagnosis not present

## 2015-09-10 DIAGNOSIS — M25561 Pain in right knee: Secondary | ICD-10-CM | POA: Diagnosis not present

## 2015-09-15 DIAGNOSIS — R262 Difficulty in walking, not elsewhere classified: Secondary | ICD-10-CM | POA: Diagnosis not present

## 2015-09-15 DIAGNOSIS — M25661 Stiffness of right knee, not elsewhere classified: Secondary | ICD-10-CM | POA: Diagnosis not present

## 2015-09-15 DIAGNOSIS — M25561 Pain in right knee: Secondary | ICD-10-CM | POA: Diagnosis not present

## 2015-09-17 DIAGNOSIS — M25561 Pain in right knee: Secondary | ICD-10-CM | POA: Diagnosis not present

## 2015-09-17 DIAGNOSIS — M25661 Stiffness of right knee, not elsewhere classified: Secondary | ICD-10-CM | POA: Diagnosis not present

## 2015-09-17 DIAGNOSIS — R262 Difficulty in walking, not elsewhere classified: Secondary | ICD-10-CM | POA: Diagnosis not present

## 2015-09-22 DIAGNOSIS — R262 Difficulty in walking, not elsewhere classified: Secondary | ICD-10-CM | POA: Diagnosis not present

## 2015-09-22 DIAGNOSIS — M25561 Pain in right knee: Secondary | ICD-10-CM | POA: Diagnosis not present

## 2015-09-22 DIAGNOSIS — M25661 Stiffness of right knee, not elsewhere classified: Secondary | ICD-10-CM | POA: Diagnosis not present

## 2015-09-24 DIAGNOSIS — M25561 Pain in right knee: Secondary | ICD-10-CM | POA: Diagnosis not present

## 2015-09-24 DIAGNOSIS — M25661 Stiffness of right knee, not elsewhere classified: Secondary | ICD-10-CM | POA: Diagnosis not present

## 2015-09-24 DIAGNOSIS — R262 Difficulty in walking, not elsewhere classified: Secondary | ICD-10-CM | POA: Diagnosis not present

## 2015-10-10 DIAGNOSIS — F3181 Bipolar II disorder: Secondary | ICD-10-CM | POA: Diagnosis not present

## 2015-10-17 DIAGNOSIS — M25561 Pain in right knee: Secondary | ICD-10-CM | POA: Diagnosis not present

## 2015-11-14 ENCOUNTER — Other Ambulatory Visit: Payer: Self-pay | Admitting: Orthopaedic Surgery

## 2015-11-14 ENCOUNTER — Other Ambulatory Visit: Payer: Self-pay | Admitting: Internal Medicine

## 2015-11-14 DIAGNOSIS — N6310 Unspecified lump in the right breast, unspecified quadrant: Secondary | ICD-10-CM

## 2015-11-18 ENCOUNTER — Other Ambulatory Visit: Payer: Self-pay | Admitting: Family Medicine

## 2015-11-18 DIAGNOSIS — N6310 Unspecified lump in the right breast, unspecified quadrant: Secondary | ICD-10-CM

## 2015-11-21 ENCOUNTER — Ambulatory Visit
Admission: RE | Admit: 2015-11-21 | Discharge: 2015-11-21 | Disposition: A | Discharge status: Home / Self Care | Payer: Medicare Other | Source: Ambulatory Visit | Attending: Internal Medicine | Admitting: Internal Medicine

## 2015-11-21 DIAGNOSIS — N6310 Unspecified lump in the right breast, unspecified quadrant: Secondary | ICD-10-CM

## 2015-11-21 DIAGNOSIS — R928 Other abnormal and inconclusive findings on diagnostic imaging of breast: Secondary | ICD-10-CM | POA: Diagnosis not present

## 2016-01-22 DIAGNOSIS — R490 Dysphonia: Secondary | ICD-10-CM | POA: Diagnosis not present

## 2016-01-22 DIAGNOSIS — M859 Disorder of bone density and structure, unspecified: Secondary | ICD-10-CM | POA: Diagnosis not present

## 2016-01-22 DIAGNOSIS — J309 Allergic rhinitis, unspecified: Secondary | ICD-10-CM | POA: Diagnosis not present

## 2016-01-22 DIAGNOSIS — F419 Anxiety disorder, unspecified: Secondary | ICD-10-CM | POA: Diagnosis not present

## 2016-01-22 DIAGNOSIS — M5136 Other intervertebral disc degeneration, lumbar region: Secondary | ICD-10-CM | POA: Diagnosis not present

## 2016-01-22 DIAGNOSIS — D7589 Other specified diseases of blood and blood-forming organs: Secondary | ICD-10-CM | POA: Diagnosis not present

## 2016-01-22 DIAGNOSIS — Z0000 Encounter for general adult medical examination without abnormal findings: Secondary | ICD-10-CM | POA: Diagnosis not present

## 2016-01-22 DIAGNOSIS — R634 Abnormal weight loss: Secondary | ICD-10-CM | POA: Diagnosis not present

## 2016-01-22 DIAGNOSIS — E781 Pure hyperglyceridemia: Secondary | ICD-10-CM | POA: Diagnosis not present

## 2016-01-22 DIAGNOSIS — F319 Bipolar disorder, unspecified: Secondary | ICD-10-CM | POA: Diagnosis not present

## 2016-01-22 DIAGNOSIS — Z209 Contact with and (suspected) exposure to unspecified communicable disease: Secondary | ICD-10-CM | POA: Diagnosis not present

## 2016-01-30 DIAGNOSIS — R634 Abnormal weight loss: Secondary | ICD-10-CM | POA: Diagnosis not present

## 2016-02-02 DIAGNOSIS — R1314 Dysphagia, pharyngoesophageal phase: Secondary | ICD-10-CM | POA: Diagnosis not present

## 2016-02-02 DIAGNOSIS — R490 Dysphonia: Secondary | ICD-10-CM | POA: Diagnosis not present

## 2016-02-12 ENCOUNTER — Other Ambulatory Visit: Payer: Self-pay | Admitting: Otolaryngology

## 2016-02-12 DIAGNOSIS — R490 Dysphonia: Secondary | ICD-10-CM | POA: Diagnosis not present

## 2016-02-12 DIAGNOSIS — R1310 Dysphagia, unspecified: Secondary | ICD-10-CM | POA: Diagnosis not present

## 2016-02-12 DIAGNOSIS — K222 Esophageal obstruction: Secondary | ICD-10-CM | POA: Diagnosis not present

## 2016-02-12 DIAGNOSIS — J381 Polyp of vocal cord and larynx: Secondary | ICD-10-CM | POA: Diagnosis not present

## 2016-02-12 DIAGNOSIS — F17299 Nicotine dependence, other tobacco product, with unspecified nicotine-induced disorders: Secondary | ICD-10-CM | POA: Diagnosis not present

## 2016-02-12 DIAGNOSIS — R634 Abnormal weight loss: Secondary | ICD-10-CM | POA: Diagnosis not present

## 2016-05-04 DIAGNOSIS — F419 Anxiety disorder, unspecified: Secondary | ICD-10-CM | POA: Diagnosis not present

## 2016-05-04 DIAGNOSIS — R634 Abnormal weight loss: Secondary | ICD-10-CM | POA: Diagnosis not present

## 2016-05-04 DIAGNOSIS — F319 Bipolar disorder, unspecified: Secondary | ICD-10-CM | POA: Diagnosis not present

## 2016-05-06 DIAGNOSIS — H524 Presbyopia: Secondary | ICD-10-CM | POA: Diagnosis not present

## 2016-05-06 DIAGNOSIS — H538 Other visual disturbances: Secondary | ICD-10-CM | POA: Diagnosis not present

## 2016-05-06 DIAGNOSIS — H2513 Age-related nuclear cataract, bilateral: Secondary | ICD-10-CM | POA: Diagnosis not present

## 2016-05-06 DIAGNOSIS — H04129 Dry eye syndrome of unspecified lacrimal gland: Secondary | ICD-10-CM | POA: Diagnosis not present

## 2016-06-21 DIAGNOSIS — K52831 Collagenous colitis: Secondary | ICD-10-CM | POA: Diagnosis not present

## 2016-06-21 DIAGNOSIS — R634 Abnormal weight loss: Secondary | ICD-10-CM | POA: Diagnosis not present

## 2016-06-21 DIAGNOSIS — K219 Gastro-esophageal reflux disease without esophagitis: Secondary | ICD-10-CM | POA: Diagnosis not present

## 2016-06-21 DIAGNOSIS — Z86010 Personal history of colonic polyps: Secondary | ICD-10-CM | POA: Diagnosis not present

## 2016-07-09 DIAGNOSIS — L905 Scar conditions and fibrosis of skin: Secondary | ICD-10-CM | POA: Diagnosis not present

## 2016-07-09 DIAGNOSIS — L821 Other seborrheic keratosis: Secondary | ICD-10-CM | POA: Diagnosis not present

## 2016-07-09 DIAGNOSIS — L820 Inflamed seborrheic keratosis: Secondary | ICD-10-CM | POA: Diagnosis not present

## 2016-07-15 ENCOUNTER — Inpatient Hospital Stay (HOSPITAL_COMMUNITY)
Admission: EM | Admit: 2016-07-15 | Discharge: 2016-07-20 | DRG: 800 | Disposition: A | Discharge status: Home / Self Care | Payer: Medicare Other | Attending: Surgery | Admitting: Surgery

## 2016-07-15 ENCOUNTER — Encounter (HOSPITAL_COMMUNITY): Payer: Self-pay | Admitting: Emergency Medicine

## 2016-07-15 ENCOUNTER — Emergency Department (HOSPITAL_COMMUNITY): Payer: Medicare Other

## 2016-07-15 DIAGNOSIS — R404 Transient alteration of awareness: Secondary | ICD-10-CM | POA: Diagnosis not present

## 2016-07-15 DIAGNOSIS — F1021 Alcohol dependence, in remission: Secondary | ICD-10-CM | POA: Diagnosis present

## 2016-07-15 DIAGNOSIS — F319 Bipolar disorder, unspecified: Secondary | ICD-10-CM | POA: Diagnosis present

## 2016-07-15 DIAGNOSIS — M1990 Unspecified osteoarthritis, unspecified site: Secondary | ICD-10-CM | POA: Diagnosis present

## 2016-07-15 DIAGNOSIS — K635 Polyp of colon: Secondary | ICD-10-CM | POA: Diagnosis not present

## 2016-07-15 DIAGNOSIS — S069X9A Unspecified intracranial injury with loss of consciousness of unspecified duration, initial encounter: Secondary | ICD-10-CM | POA: Diagnosis not present

## 2016-07-15 DIAGNOSIS — Z1211 Encounter for screening for malignant neoplasm of colon: Secondary | ICD-10-CM | POA: Diagnosis not present

## 2016-07-15 DIAGNOSIS — S36032A Major laceration of spleen, initial encounter: Principal | ICD-10-CM | POA: Diagnosis present

## 2016-07-15 DIAGNOSIS — D62 Acute posthemorrhagic anemia: Secondary | ICD-10-CM | POA: Diagnosis not present

## 2016-07-15 DIAGNOSIS — S3609XA Other injury of spleen, initial encounter: Secondary | ICD-10-CM | POA: Diagnosis present

## 2016-07-15 DIAGNOSIS — S0101XA Laceration without foreign body of scalp, initial encounter: Secondary | ICD-10-CM

## 2016-07-15 DIAGNOSIS — Z881 Allergy status to other antibiotic agents status: Secondary | ICD-10-CM

## 2016-07-15 DIAGNOSIS — I959 Hypotension, unspecified: Secondary | ICD-10-CM | POA: Diagnosis present

## 2016-07-15 DIAGNOSIS — S36031A Moderate laceration of spleen, initial encounter: Secondary | ICD-10-CM | POA: Diagnosis not present

## 2016-07-15 DIAGNOSIS — D122 Benign neoplasm of ascending colon: Secondary | ICD-10-CM | POA: Diagnosis not present

## 2016-07-15 DIAGNOSIS — D735 Infarction of spleen: Secondary | ICD-10-CM

## 2016-07-15 DIAGNOSIS — Z23 Encounter for immunization: Secondary | ICD-10-CM

## 2016-07-15 DIAGNOSIS — F17210 Nicotine dependence, cigarettes, uncomplicated: Secondary | ICD-10-CM | POA: Diagnosis present

## 2016-07-15 DIAGNOSIS — I739 Peripheral vascular disease, unspecified: Secondary | ICD-10-CM | POA: Diagnosis present

## 2016-07-15 DIAGNOSIS — D123 Benign neoplasm of transverse colon: Secondary | ICD-10-CM | POA: Diagnosis not present

## 2016-07-15 DIAGNOSIS — Z888 Allergy status to other drugs, medicaments and biological substances status: Secondary | ICD-10-CM | POA: Diagnosis not present

## 2016-07-15 DIAGNOSIS — S35329A Unspecified injury of splenic vein, initial encounter: Secondary | ICD-10-CM | POA: Diagnosis not present

## 2016-07-15 DIAGNOSIS — K219 Gastro-esophageal reflux disease without esophagitis: Secondary | ICD-10-CM | POA: Diagnosis present

## 2016-07-15 DIAGNOSIS — D7389 Other diseases of spleen: Secondary | ICD-10-CM

## 2016-07-15 DIAGNOSIS — Z86010 Personal history of colonic polyps: Secondary | ICD-10-CM | POA: Diagnosis not present

## 2016-07-15 DIAGNOSIS — Z87898 Personal history of other specified conditions: Secondary | ICD-10-CM

## 2016-07-15 DIAGNOSIS — R1013 Epigastric pain: Secondary | ICD-10-CM | POA: Diagnosis not present

## 2016-07-15 DIAGNOSIS — E785 Hyperlipidemia, unspecified: Secondary | ICD-10-CM | POA: Diagnosis present

## 2016-07-15 DIAGNOSIS — S36039A Unspecified laceration of spleen, initial encounter: Secondary | ICD-10-CM | POA: Diagnosis not present

## 2016-07-15 DIAGNOSIS — K5730 Diverticulosis of large intestine without perforation or abscess without bleeding: Secondary | ICD-10-CM | POA: Diagnosis not present

## 2016-07-15 DIAGNOSIS — K668 Other specified disorders of peritoneum: Secondary | ICD-10-CM | POA: Diagnosis not present

## 2016-07-15 DIAGNOSIS — K6389 Other specified diseases of intestine: Secondary | ICD-10-CM | POA: Diagnosis not present

## 2016-07-15 DIAGNOSIS — Z882 Allergy status to sulfonamides status: Secondary | ICD-10-CM

## 2016-07-15 DIAGNOSIS — R55 Syncope and collapse: Secondary | ICD-10-CM

## 2016-07-15 DIAGNOSIS — D120 Benign neoplasm of cecum: Secondary | ICD-10-CM | POA: Diagnosis present

## 2016-07-15 DIAGNOSIS — W1839XA Other fall on same level, initial encounter: Secondary | ICD-10-CM | POA: Diagnosis present

## 2016-07-15 DIAGNOSIS — K222 Esophageal obstruction: Secondary | ICD-10-CM | POA: Diagnosis not present

## 2016-07-15 DIAGNOSIS — D126 Benign neoplasm of colon, unspecified: Secondary | ICD-10-CM

## 2016-07-15 LAB — I-STAT CHEM 8, ED
BUN: 12 mg/dL (ref 6–20)
CALCIUM ION: 0.97 mmol/L — AB (ref 1.13–1.30)
CREATININE: 0.9 mg/dL (ref 0.44–1.00)
Chloride: 103 mmol/L (ref 101–111)
Glucose, Bld: 210 mg/dL — ABNORMAL HIGH (ref 65–99)
HCT: 29 % — ABNORMAL LOW (ref 36.0–46.0)
Hemoglobin: 9.9 g/dL — ABNORMAL LOW (ref 12.0–15.0)
Potassium: 5.3 mmol/L — ABNORMAL HIGH (ref 3.5–5.1)
Sodium: 137 mmol/L (ref 135–145)
TCO2: 18 mmol/L (ref 0–100)

## 2016-07-15 LAB — CBC WITH DIFFERENTIAL/PLATELET
Basophils Absolute: 0 10*3/uL (ref 0.0–0.1)
Basophils Relative: 0 %
Eosinophils Absolute: 0 10*3/uL (ref 0.0–0.7)
Eosinophils Relative: 0 %
HEMATOCRIT: 27.6 % — AB (ref 36.0–46.0)
Hemoglobin: 9.2 g/dL — ABNORMAL LOW (ref 12.0–15.0)
LYMPHS ABS: 1.6 10*3/uL (ref 0.7–4.0)
LYMPHS PCT: 24 %
MCH: 37.1 pg — ABNORMAL HIGH (ref 26.0–34.0)
MCHC: 33.3 g/dL (ref 30.0–36.0)
MCV: 111.3 fL — ABNORMAL HIGH (ref 78.0–100.0)
MONO ABS: 0.7 10*3/uL (ref 0.1–1.0)
MONOS PCT: 10 %
NEUTROS ABS: 4.5 10*3/uL (ref 1.7–7.7)
Neutrophils Relative %: 66 %
Platelets: 133 10*3/uL — ABNORMAL LOW (ref 150–400)
RBC: 2.48 MIL/uL — ABNORMAL LOW (ref 3.87–5.11)
RDW: 14.8 % (ref 11.5–15.5)
WBC: 6.8 10*3/uL (ref 4.0–10.5)

## 2016-07-15 LAB — COMPREHENSIVE METABOLIC PANEL
ALK PHOS: 57 U/L (ref 38–126)
ALT: 24 U/L (ref 14–54)
ANION GAP: 17 — AB (ref 5–15)
AST: 49 U/L — ABNORMAL HIGH (ref 15–41)
Albumin: 3.3 g/dL — ABNORMAL LOW (ref 3.5–5.0)
BUN: 14 mg/dL (ref 6–20)
CO2: 18 mmol/L — AB (ref 22–32)
Calcium: 8 mg/dL — ABNORMAL LOW (ref 8.9–10.3)
Chloride: 103 mmol/L (ref 101–111)
Creatinine, Ser: 1.02 mg/dL — ABNORMAL HIGH (ref 0.44–1.00)
GFR calc Af Amer: >60 mL/min (ref >60)
GFR calc non Af Amer: 59 mL/min — ABNORMAL LOW (ref >60)
GLUCOSE: 218 mg/dL — AB (ref 65–99)
POTASSIUM: 5.3 mmol/L — AB (ref 3.5–5.1)
SODIUM: 138 mmol/L (ref 135–145)
Total Bilirubin: 1.2 mg/dL (ref 0.3–1.2)
Total Protein: 5.5 g/dL — ABNORMAL LOW (ref 6.5–8.1)

## 2016-07-15 LAB — TYPE AND SCREEN
ABO/RH(D): O POS
Antibody Screen: NEGATIVE

## 2016-07-15 LAB — PROTIME-INR
INR: 1.09
Prothrombin Time: 14.1 seconds (ref 11.4–15.2)

## 2016-07-15 LAB — CBG MONITORING, ED: GLUCOSE-CAPILLARY: 220 mg/dL — AB (ref 65–99)

## 2016-07-15 LAB — APTT: APTT: 23 s — AB (ref 24–36)

## 2016-07-15 MED ORDER — FENTANYL CITRATE (PF) 100 MCG/2ML IJ SOLN
50.0000 ug | Freq: Once | INTRAMUSCULAR | Status: AC
  Administered 2016-07-15: 50 ug via INTRAVENOUS
  Filled 2016-07-15: qty 2

## 2016-07-15 MED ORDER — SODIUM CHLORIDE 0.9 % IV SOLN
8.0000 mg | Freq: Four times a day (QID) | INTRAVENOUS | Status: DC | PRN
  Filled 2016-07-15: qty 4

## 2016-07-15 MED ORDER — METHOCARBAMOL 1000 MG/10ML IJ SOLN: 1000.0000 mg | Freq: Four times a day (QID) | INTRAVENOUS | Status: DC | PRN

## 2016-07-15 MED ORDER — BISACODYL 10 MG RE SUPP: 10.0000 mg | Freq: Two times a day (BID) | RECTAL | Status: DC | PRN

## 2016-07-15 MED ORDER — DIPHENHYDRAMINE HCL 50 MG/ML IJ SOLN: 12.5000 mg | Freq: Four times a day (QID) | INTRAMUSCULAR | Status: DC | PRN

## 2016-07-15 MED ORDER — SODIUM CHLORIDE 0.9 % IV BOLUS (SEPSIS)
1000.0000 mL | Freq: Once | INTRAVENOUS | Status: AC
  Administered 2016-07-15: 1000 mL via INTRAVENOUS

## 2016-07-15 MED ORDER — ONDANSETRON HCL 4 MG/2ML IJ SOLN
4.0000 mg | Freq: Four times a day (QID) | INTRAMUSCULAR | Status: DC | PRN
  Administered 2016-07-16: 4 mg via INTRAVENOUS
  Filled 2016-07-15: qty 2

## 2016-07-15 MED ORDER — METHOCARBAMOL 1000 MG/10ML IJ SOLN
1000.0000 mg | Freq: Four times a day (QID) | INTRAMUSCULAR | Status: DC | PRN
  Filled 2016-07-15: qty 10

## 2016-07-15 MED ORDER — PROCHLORPERAZINE EDISYLATE 5 MG/ML IJ SOLN
5.0000 mg | INTRAMUSCULAR | Status: DC | PRN
  Administered 2016-07-16: 10 mg via INTRAVENOUS
  Filled 2016-07-15 (×2): qty 2

## 2016-07-15 MED ORDER — SODIUM CHLORIDE 0.9 % IV SOLN
INTRAVENOUS | Status: DC
  Administered 2016-07-15 – 2016-07-16 (×2): via INTRAVENOUS
  Filled 2016-07-15 (×4): qty 1000

## 2016-07-15 MED ORDER — MAGIC MOUTHWASH
15.0000 mL | Freq: Four times a day (QID) | ORAL | Status: DC | PRN
Start: 2016-07-15 — End: 2016-07-16

## 2016-07-15 MED ORDER — OXYCODONE HCL 5 MG PO TABS: 5.0000 mg | ORAL_TABLET | ORAL | Status: DC | PRN

## 2016-07-15 MED ORDER — HYDROMORPHONE HCL 1 MG/ML IJ SOLN
0.5000 mg | INTRAMUSCULAR | Status: DC | PRN
  Administered 2016-07-15 – 2016-07-17 (×9): 1 mg via INTRAVENOUS
  Filled 2016-07-15 (×9): qty 1

## 2016-07-15 MED ORDER — MENTHOL 3 MG MT LOZG
1.0000 | LOZENGE | OROMUCOSAL | Status: DC | PRN
Start: 2016-07-15 — End: 2016-07-20

## 2016-07-15 MED ORDER — ACETAMINOPHEN 500 MG PO TABS
1000.0000 mg | ORAL_TABLET | Freq: Three times a day (TID) | ORAL | Status: DC
  Administered 2016-07-16 – 2016-07-18 (×2): 1000 mg via ORAL
  Filled 2016-07-15 (×8): qty 2

## 2016-07-15 MED ORDER — LACTATED RINGERS IV BOLUS (SEPSIS)
1000.0000 mL | Freq: Three times a day (TID) | INTRAVENOUS | Status: AC | PRN
  Administered 2016-07-16: 1000 mL via INTRAVENOUS
  Filled 2016-07-15: qty 1000

## 2016-07-15 MED ORDER — BLISTEX MEDICATED EX OINT
1.0000 "application " | TOPICAL_OINTMENT | Freq: Two times a day (BID) | CUTANEOUS | Status: DC
  Administered 2016-07-18 – 2016-07-20 (×3): 1 via TOPICAL
  Filled 2016-07-15: qty 6.3

## 2016-07-15 MED ORDER — CYCLOBENZAPRINE HCL 5 MG PO TABS
5.0000 mg | ORAL_TABLET | Freq: Three times a day (TID) | ORAL | Status: DC | PRN
  Administered 2016-07-16 – 2016-07-19 (×6): 5 mg via ORAL
  Filled 2016-07-15 (×6): qty 1

## 2016-07-15 MED ORDER — ONDANSETRON HCL 4 MG/2ML IJ SOLN
4.0000 mg | INTRAMUSCULAR | Status: DC | PRN
  Administered 2016-07-15 – 2016-07-17 (×2): 4 mg via INTRAVENOUS
  Filled 2016-07-15 (×2): qty 2

## 2016-07-15 MED ORDER — QUETIAPINE FUMARATE ER 400 MG PO TB24
400.0000 mg | ORAL_TABLET | Freq: Every day | ORAL | Status: DC
  Administered 2016-07-17 – 2016-07-19 (×3): 400 mg via ORAL
  Filled 2016-07-15 (×7): qty 1

## 2016-07-15 MED ORDER — LORAZEPAM 2 MG/ML IJ SOLN: 0.5000 mg | Freq: Three times a day (TID) | INTRAMUSCULAR | Status: DC | PRN

## 2016-07-15 MED ORDER — CYANOCOBALAMIN 500 MCG PO TABS
500.0000 ug | ORAL_TABLET | Freq: Every day | ORAL | Status: DC
  Administered 2016-07-16 – 2016-07-20 (×5): 500 ug via ORAL
  Filled 2016-07-15 (×5): qty 1

## 2016-07-15 MED ORDER — ALUM & MAG HYDROXIDE-SIMETH 200-200-20 MG/5ML PO SUSP: 30.0000 mL | Freq: Four times a day (QID) | ORAL | Status: DC | PRN

## 2016-07-15 MED ORDER — IOPAMIDOL (ISOVUE-300) INJECTION 61%
100.0000 mL | Freq: Once | INTRAVENOUS | Status: AC | PRN
  Administered 2016-07-15: 100 mL via INTRAVENOUS

## 2016-07-15 MED ORDER — LACTATED RINGERS IV BOLUS (SEPSIS)
1000.0000 mL | Freq: Once | INTRAVENOUS | Status: AC
  Administered 2016-07-15: 1000 mL via INTRAVENOUS

## 2016-07-15 MED ORDER — CALCIUM CARBONATE-VITAMIN D 500-200 MG-UNIT PO TABS
1.0000 | ORAL_TABLET | Freq: Two times a day (BID) | ORAL | Status: DC
  Administered 2016-07-16 – 2016-07-20 (×9): 1 via ORAL
  Filled 2016-07-15 (×11): qty 1

## 2016-07-15 MED ORDER — GABAPENTIN 300 MG PO CAPS
600.0000 mg | ORAL_CAPSULE | Freq: Three times a day (TID) | ORAL | Status: DC
  Administered 2016-07-16 – 2016-07-20 (×14): 600 mg via ORAL
  Filled 2016-07-15 (×14): qty 2

## 2016-07-15 MED ORDER — SODIUM CHLORIDE 0.9 % IV SOLN
Freq: Once | INTRAVENOUS | Status: AC
  Administered 2016-07-16: via INTRAVENOUS

## 2016-07-15 MED ORDER — PHENOL 1.4 % MT LIQD: 2.0000 | OROMUCOSAL | Status: DC | PRN

## 2016-07-15 NOTE — ED Triage Notes (Signed)
Per EMS patient comes from home for syncopal episode causing her to fall.  Patient had a colonoscopy today and had generalized abd pain after test, but states that she was sent home anyways.  Patient has c-collar in place by EMS due to pain after fall.  Patient hypotensive at scene 73/30.

## 2016-07-15 NOTE — ED Provider Notes (Signed)
[facility] DEPT Provider Note   CSN: [id number] Arrival date & time: 07/15/16  1819  First Provider Contact:  First MD Initiated Contact with Patient 07/15/16 1832        History   Chief Complaint Chief Complaint  Patient presents with  . Loss of Consciousness  . Abdominal Pain  . Hypotension    HPI Monique Perry is a 60 y.o. female.  Patient with a history of bipolar disorder and prior remote drug and alcohol abuse presents after syncopal episode. She states that she was having a colonoscopy done today. When she started taking the bowel prep this morning she began having some abdominal pain and mostly across her lower abdomen. She had the colonoscopy where she was told that they removed 2-3 polyps. She states that she had increased intensity in pain. She was told that it was likely related to gas. Throughout the afternoon her pain is continued to worsen.  She states that she was at home this evening and started feeling dizzy and lightheaded and felt like she was given pass out. She fell down onto the bed. She then heard the doorbell ringing and started walking to the door bell where she felt lightheaded again and passed out, shaking her head on the doorknob. She complains of pain to the back of her head. She has a small laceration there. She also has pain to the left side of her neck but denies any other neck or back pain. No numbness or weakness to her extremities. No fevers. No nausea or vomiting. She denies any other injuries from the fall. She denies any diarrhea or blood in her stool. She is passing gas.      Past Medical History:  Diagnosis Date  . Alcohol abuse last February 2012  . Arthritis   . Bipolar disorder ([place])   . Collagenous colitis   . Drug abuse last 1990's    Patient Active Problem List   Diagnosis Date Noted  . Splenic rupture 07/15/2016  . Serrated adenoma of cecum s/p polypectomy 2011 07/15/2016  . Neck pain on left side 11/02/2014  . Elevated  transaminase level 02/14/2014  . Anemia 11/14/2013  . History of alcoholism ([place]) 11/12/2013  . Hepatitis B vaccination administered at current visit 11/12/2013  . Knee pain 06/18/2013  . Right foot pain s/p fusion surgery of right foot 05/10/2013  . Fracture of metatarsal of right foot, closed 03/20/2013  . Abnormality of gait 03/20/2013  . Low back pain radiating to right leg 02/20/2013  . Chronic back pain 02/08/2013  . Atherosclerotic peripheral vascular disease with intermittent claudication ([place]) 02/08/2013  . Carotid bruit 02/08/2013  . High blood pressure 02/08/2013  . LSIL (low grade squamous intraepithelial lesion) on Pap smear 12/14/2012  . Osteopenia 10/02/2012  . Hyperlipidemia 08/31/2012  . Depression 08/31/2012  . left nose skin lesion 08/31/2012  . Drug abuse, history of   . Bipolar disorder ([place])   . Collagenous colitis   . Medically noncompliant 07/26/2012  . GERD (gastroesophageal reflux disease) 07/21/2012  . Tobacco abuse 07/21/2012    Past Surgical History:  Procedure Laterality Date  . ABDOMINAL HYSTERECTOMY  Age 57 years ago   partial   . FOOT SURGERY    . Lapidus fusion Right 04/17/2012  . NOSE SURGERY    . OSTEOTOMY Right 04/17/2012   Rt #2 Metatarsal  . TONSILLECTOMY  As a child   . WRIST SURGERY      OB History    Gravida  Para Term Preterm AB Living   3 0     3 0   SAB TAB Ectopic Multiple Live Births   2 1 0 0        Obstetric Comments   Miscarriage due to drug abuse.       Home Medications    Prior to Admission medications   Medication Sig Start Date End Date Taking? Authorizing Provider  calcium citrate-vitamin D (CITRACAL+D) 315-200 MG-UNIT per tablet Take 1 tablet by mouth 2 (two) times daily. She takes Calcium Vitamin D 600-800 1 tab BID   Yes Historical Provider, MD  cyclobenzaprine (FLEXERIL) 10 MG tablet TAKE 1 TABLET BY MOUTH 3 TIMES A DAY AS NEEDED FOR MUSCLE SPASM 11/21/14  Yes Langley Gauss Moding, MD  gabapentin (NEURONTIN)  300 MG capsule TAKE 2 CAPSULES BY MOUTH 3 TIMES A DAY   Yes Gerda Diss, DO  QUEtiapine (SEROQUEL XR) 400 MG 24 hr tablet Take 400 mg by mouth at bedtime.   Yes Historical Provider, MD  traMADol (ULTRAM) 50 MG tablet Take 50 mg by mouth every 6 (six) hours as needed for moderate pain or severe pain.   Yes Historical Provider, MD  vitamin B-12 (CYANOCOBALAMIN) 500 MCG tablet Take 500 mcg by mouth daily.   Yes Historical Provider, MD  CHANTIX STARTING MONTH PAK 0.5 MG X 11 & 1 MG X 42 tablet TAKE AS DIRECTED Patient not taking: Reported on 07/15/2016 12/31/14   Charlesetta Shanks, MD    Family History Family History  Problem Relation Age of Onset  . Stroke Mother   . Diabetes Father   . Hypertension Father   . Hyperlipidemia Father   . Breast cancer Sister     Social History Social History  Substance Use Topics  . Smoking status: Current Some Day Smoker    Packs/day: 0.50    Types: Cigarettes, Cigars  . Smokeless tobacco: Never Used     Comment: smoking cig for 35 years. continues to smoke 1/2 pack daily  . Alcohol use 0.0 oz/week     Comment: ETOH abuse for 25 year, 1/2 gallon Liquor daily. admits 6 weeks course of drinking ETOH as her only intake in 2011. she states that she quits her drinnking since Feb 2012.     Allergies   Erythrocin; Sulfa antibiotics; Trazodone and nefazodone; and Tramadol   Review of Systems Review of Systems  Constitutional: Negative for chills, diaphoresis, fatigue and fever.  HENT: Negative for congestion, rhinorrhea and sneezing.   Eyes: Negative.   Respiratory: Negative for cough, chest tightness and shortness of breath.   Cardiovascular: Negative for chest pain and leg swelling.  Gastrointestinal: Positive for abdominal pain. Negative for blood in stool, diarrhea, nausea and vomiting.  Genitourinary: Negative for difficulty urinating, flank pain, frequency and hematuria.  Musculoskeletal: Positive for neck pain. Negative for arthralgias and back  pain.  Skin: Negative for rash.  Neurological: Positive for dizziness, syncope, light-headedness and headaches. Negative for speech difficulty, weakness and numbness.     Physical Exam Updated Vital Signs BP 122/87   Pulse 95   Temp (!) 96.6 F (35.9 C) (Axillary)   Resp 26   SpO2 95%   Physical Exam  Constitutional: She is oriented to person, place, and time. She appears well-developed and well-nourished.  HENT:  Head: Normocephalic and atraumatic.  Eyes: Pupils are equal, round, and reactive to light.  Neck: Normal range of motion. Neck supple.  Patient has some tenderness along the left trapezius.  However there is no spinal tenderness to the cervical spine. No pain to the thoracic or lumbosacral spine. No step-offs or deformities noted.  Cardiovascular: Normal rate and regular rhythm.   Murmur heard. Pulmonary/Chest: Effort normal and breath sounds normal. No respiratory distress. She has no wheezes. She has no rales. She exhibits no tenderness.  Abdominal: Soft. Bowel sounds are normal. There is tenderness (Moderate tenderness throughout the abdomen, more in the lower quadrants). There is no rebound and no guarding.  Musculoskeletal: Normal range of motion. She exhibits no edema.  No pain on palpation or range of motion of her extremities  Lymphadenopathy:    She has no cervical adenopathy.  Neurological: She is alert and oriented to person, place, and time.  Skin: Skin is warm and dry. No rash noted.  Psychiatric: She has a normal mood and affect.     ED Treatments / Results  Labs (all labs ordered are listed, but only abnormal results are displayed) Labs Reviewed  COMPREHENSIVE METABOLIC PANEL - Abnormal; Notable for the following:       Result Value   Potassium 5.3 (*)    CO2 18 (*)    Glucose, Bld 218 (*)    Creatinine, Ser 1.02 (*)    Calcium 8.0 (*)    Total Protein 5.5 (*)    Albumin 3.3 (*)    AST 49 (*)    GFR calc non Af Amer 59 (*)    Anion gap 17 (*)     All other components within normal limits  CBC WITH DIFFERENTIAL/PLATELET - Abnormal; Notable for the following:    RBC 2.48 (*)    Hemoglobin 9.2 (*)    HCT 27.6 (*)    MCV 111.3 (*)    MCH 37.1 (*)    Platelets 133 (*)    All other components within normal limits  APTT - Abnormal; Notable for the following:    aPTT 23 (*)    All other components within normal limits  I-STAT CHEM 8, ED - Abnormal; Notable for the following:    Potassium 5.3 (*)    Glucose, Bld 210 (*)    Calcium, Ion 0.97 (*)    Hemoglobin 9.9 (*)    HCT 29.0 (*)    All other components within normal limits  PROTIME-INR  URINALYSIS, ROUTINE W REFLEX MICROSCOPIC (NOT AT [facility])  COMPREHENSIVE METABOLIC PANEL  CBC  MAGNESIUM  HEMOGLOBIN  HEMOGLOBIN  TYPE AND SCREEN  ABO/RH    EKG  EKG Interpretation None       Radiology Ct Head Wo Contrast  Result Date: 07/15/2016 CLINICAL DATA:  Syncope at home leading to fall. EXAM: CT HEAD WITHOUT CONTRAST TECHNIQUE: Contiguous axial images were obtained from the base of the skull through the vertex without intravenous contrast. COMPARISON:  07/20/2012 FINDINGS: Brain: No intracranial hemorrhage, mass effect, or midline shift. No hydrocephalus. The basilar cisterns are patent. No evidence of territorial infarct. Minimal basal gangliar calcifications are unchanged. No intracranial fluid collection. Minimal generalized atrophy. Vascular: No hyperdense vessel or abnormal calcification. Skull: Small laceration posteriorly with subcutaneous air. No fracture. Calvarium is intact. Sinuses/Orbits: Remote fracture of the medial wall of the left orbit versus laminar appreciated dehiscence, however is unchanged from prior. No acute findings. Mastoid air cells are well aerated. Other: None. IMPRESSION: No acute intracranial abnormality. Electronically Signed   By: Jeb Levering M.D.   On: 07/15/2016 20:59   Ct Abdomen Pelvis W Contrast  Addendum Date: 07/15/2016   ADDENDUM  REPORT: 07/15/2016 21:33 ADDENDUM: Correction, impression # 2 should read: "2.  Moderate volume HEMOPERITONEUM in the abdomen and pelvis." This was discussed by telephone with Trauma Surgery Dr. Johney Maine On 07/15/2016 at 21:32 . Electronically Signed   By: Genevie Ann M.D.   On: 07/15/2016 21:33   Result Date: 07/15/2016 CLINICAL DATA:  60 year old female with generalized abdominal pain after colonoscopy today. Hypotension at presentation. Syncope and fall. Initial encounter. EXAM: CT ABDOMEN AND PELVIS WITH CONTRAST TECHNIQUE: Multidetector CT imaging of the abdomen and pelvis was performed using the standard protocol following bolus administration of intravenous contrast. CONTRAST:  165mL ISOVUE-300 IOPAMIDOL (ISOVUE-300) INJECTION 61% COMPARISON:  CT Abdomen and Pelvis 06/11/2010. FINDINGS: Negative lung bases aside from mild atelectasis. No pericardial or pleural effusion. No acute osseous abnormality identified. Moderate to large volume of free fluid in the pelvis. Moderate volume of free fluid in the abdomen, and abnormal appearance of the spleen with surrounding hemorrhage (series 2, image 22). Evidence of active extravasation on series 2, image 25, 0 and on delayed images there is a focus of contrast extravasation seen on series 5, image 6. No free air or extraluminal gas identified in the abdomen. The large bowel is predominantly decompressed, including the splenic flexure which is located just posterior and inferior to the spleen. Oral contrast was administered. No dilated small bowel. Negative stomach and duodenum. Hepatic steatosis. Liver enhancement otherwise within normal limits. Negative gallbladder, pancreas and adrenal glands. Portal venous system and splenic vein are patent. Aortoiliac calcified atherosclerosis noted. Major arterial structures in the abdomen and pelvis are patent. Sequelae of hysterectomy. adnexa are obscured by the pelvic free fluid. Diminutive urinary bladder. No lymphadenopathy  identified. Renal enhancement and contrast excretion within normal limits. The IVC is slit-like in the upper abdomen. IMPRESSION: 1. Splenic Rupture with Active Extravasation. Preliminary report of this critical finding discussed by telephone with Dr. Threasa Beards Durand Wittmeyer on 07/15/2016 at 2054 hours. 2. Moderate volume pneumoperitoneum in the abdomen and pelvis. 3. No pneumoperitoneum or evidence of bowel perforation identified. 4. No other acute finding in the abdomen or pelvis. Calcified aortic atherosclerosis. Electronically Signed: By: Genevie Ann M.D. On: 07/15/2016 21:01    Procedures Procedures (including critical care time)  Medications Ordered in ED Medications  ondansetron (ZOFRAN) injection 4 mg (not administered)  sodium chloride 0.9 % 1,000 mL infusion ( Intravenous New Bag/Given 07/15/16 2217)  lactated ringers bolus 1,000 mL (1,000 mLs Intravenous New Bag/Given 07/15/16 2213)  lactated ringers bolus 1,000 mL (not administered)  HYDROmorphone (DILAUDID) injection 0.5-2 mg (not administered)  oxyCODONE (Oxy IR/ROXICODONE) immediate release tablet 5-10 mg (not administered)  acetaminophen (TYLENOL) tablet 1,000 mg (not administered)  lip balm (CARMEX) ointment 1 application (not administered)  phenol (CHLORASEPTIC) mouth spray 2 spray (not administered)  menthol-cetylpyridinium (CEPACOL) lozenge 3 mg (not administered)  magic mouthwash (not administered)  alum & mag hydroxide-simeth (MAALOX/MYLANTA) 200-200-20 MG/5ML suspension 30 mL (not administered)  bisacodyl (DULCOLAX) suppository 10 mg (not administered)  prochlorperazine (COMPAZINE) injection 5-10 mg (not administered)  ondansetron (ZOFRAN) injection 4 mg (not administered)    Or  ondansetron (ZOFRAN) 8 mg in sodium chloride 0.9 % 50 mL IVPB (not administered)  diphenhydrAMINE (BENADRYL) injection 12.5-25 mg (not administered)  LORazepam (ATIVAN) injection 0.5-1 mg (not administered)  0.9 %  sodium chloride infusion (not administered)    cyclobenzaprine (FLEXERIL) tablet 5 mg (not administered)  gabapentin (NEURONTIN) capsule 600 mg (not administered)  QUEtiapine (SEROQUEL XR) 24 hr tablet 400 mg (not administered)  vitamin B-12 (  CYANOCOBALAMIN) tablet 500 mcg (not administered)  calcium citrate-vitamin D (CITRACAL+D) 315-200 MG-UNIT per tablet 1 tablet (not administered)  methocarbamol (ROBAXIN) 1,000 mg in dextrose 5 % 50 mL IVPB (not administered)  sodium chloride 0.9 % bolus 1,000 mL (0 mLs Intravenous Stopped 07/15/16 2156)  fentaNYL (SUBLIMAZE) injection 50 mcg (50 mcg Intravenous Given 07/15/16 2007)  iopamidol (ISOVUE-300) 61 % injection 100 mL (100 mLs Intravenous Contrast Given 07/15/16 2039)  fentaNYL (SUBLIMAZE) injection 50 mcg (50 mcg Intravenous Given 07/15/16 2143)     Initial Impression / Assessment and Plan / ED Course  I have reviewed the triage vital signs and the nursing notes.  Pertinent labs & imaging results that were available during my care of the patient were reviewed by me and considered in my medical decision making (see chart for details).  Clinical Course  Comment By Time  Pt with splenic rupture with moderate blood in abdomen, active extravasation of blood.  Dr. Johney Maine paged.  Hgb 9.2, BP better, [id number] systolic.  Has had type and screen. Malvin Johns, MD 08/03 2102  Spoke with Dr. Johney Maine who will see pt Malvin Johns, MD 08/03 2125    Pt to be [facility] to trauma service.    CRITICAL CARE Performed by: Tytionna Cloyd Total critical care time: 60 minutes Critical care time was exclusive of separately billable procedures and treating other patients. Critical care was necessary to treat or prevent imminent or life-threatening deterioration. Critical care was time spent personally by me on the following activities: development of treatment plan with patient and/or surrogate as well as nursing, discussions with consultants, evaluation of patient's response to treatment, examination of  patient, obtaining history from patient or surrogate, ordering and performing treatments and interventions, ordering and review of laboratory studies, ordering and review of radiographic studies, pulse oximetry and re-evaluation of patient's condition.   Final Clinical Impressions(s) / ED Diagnoses   Final diagnoses:  Splenic rupture  Scalp laceration, initial encounter  Syncope, unspecified syncope type    New Prescriptions New Prescriptions   No medications on file     Malvin Johns, MD 07/15/16 2232

## 2016-07-15 NOTE — H&P (Addendum)
[address] [place], [place] [address] Phone: [phone] FAX: [phone]     Monique Perry  1956/11/16 [id number]  CARE TEAM:  PCP: Annia Belt, MD  Outpatient Care Team: Patient Care Team: Annia Belt, MD as PCP - General (Oncology) Carol Ada, MD as Consulting Physician (Gastroenterology)  Inpatient Treatment Team: Treatment Team: Attending Provider: Trauma Md, MD; Registered Nurse: Kirt Boys, RN; Registered Nurse: Caren Griffins, RN; Registered Nurse: Tommye Standard, RN; Consulting Physician: Trauma Md, MD; Consulting Physician: Carol Ada, MD; Consulting Physician: Nolon Nations, MD  This patient is a 60 y.o.female who presents today for surgical evaluation at the request of Dr. Tamera Punt.   Reason for evaluation: Splenic rupture  Patient with bipolar disorder, sessile ulcerated adenoma of cecum status post polypectomy in 2011 for anemia who underwent colonoscopy today by Dr. Carol Ada.  She had some abdominal discomfort with the bowel prep.  She woke up after the colonoscopy with moderate abdominal pain.  They recall a few polyps being removed.  Discharged home.  Had worsening abdominal pain.  Felt lightheaded.  She fell down and hit her head on the door while trying to answer the door belt.Marland Kitchen  Apparently passed out.  Ambulance called.  Systolic blood pressure 70 on root.  Given bolus of IV fluids.  Systolic blood pressure 122-482.  Complaining of especially left-sided abdominal pain.  Anemic.  CT scan done.  Shows obvious splenic rupture with hemoperitoneum and extravasation into a splenic capsule without free extravasation.  Surgical consultation requested.  Patient's had a hysterectomy.  No other abdominal surgeries.  Had anemia workup in 2011 with mild gastritis, normal capsule endoscopy, solitary cecal polyp.  Some question of collagenous colitis when she lived in [place] but nothing  active since she returned to [place] six years ago.  Emergency room nurse at bedside.  Family including sister that has medical power of attorney is at bedside as well.  Some alcohol abuse but no history of jaundice or cirrhosis.  No history of coagulation disorder.  She is not on blood thinners.  Moderate tobacco abuse.  Past Medical History:  Diagnosis Date  . Alcohol abuse last February 2012  . Arthritis   . Bipolar disorder ([place])   . Collagenous colitis   . Drug abuse last 1990's    Past Surgical History:  Procedure Laterality Date  . ABDOMINAL HYSTERECTOMY  Age 29 years ago   partial   . FOOT SURGERY    . Lapidus fusion Right 04/17/2012  . NOSE SURGERY    . OSTEOTOMY Right 04/17/2012   Rt #2 Metatarsal  . TONSILLECTOMY  As a child   . WRIST SURGERY      Social History   Social History  . Marital status: Single    Spouse name: N/A  . Number of children: N/A  . Years of education: N/A   Occupational History  . Not on file.   Social History Main Topics  . Smoking status: Current Some Day Smoker    Packs/day: 0.50    Types: Cigarettes, Cigars  . Smokeless tobacco: Never Used     Comment: smoking cig for 35 years. continues to smoke 1/2 pack daily  . Alcohol use 0.0 oz/week     Comment: ETOH abuse for 25 year, 1/2 gallon Liquor daily. admits 6 weeks course of drinking ETOH as her only intake in 2011. she states that she quits her drinnking since  Feb 2012.  . Drug use: No     Comment: pots before, clean since 1995  . Sexual activity: No   Other Topics Concern  . Not on file   Social History Narrative   She lives with his stepson in [place]. Moved from [place] 2 years ago. Both  Parents lives in [place]. 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 Did not complete High School .       She admits only 2-3 hours daily for 15 years. She does not sleep at nighttime. She would watch TV and read at night time.  Always sleep during the day. She has seen a psychiatrist at [facility] and still goes to the group therapy on Mondays. She has an follow up appt with her Psychiatrist on 08/07/12.    Family History  Problem Relation Age of Onset  . Stroke Mother   . Diabetes Father   . Hypertension Father   . Hyperlipidemia Father   . Breast cancer Sister     Current Facility-Administered Medications  Medication Dose Route Frequency Provider Last Rate Last Dose  . acetaminophen (TYLENOL) tablet 1,000 mg  1,000 mg Oral TID Michael Boston, MD      . alum & mag hydroxide-simeth (MAALOX/MYLANTA) 200-200-20 MG/5ML suspension 30 mL  30 mL Oral Q6H PRN Michael Boston, MD      . bisacodyl (DULCOLAX) suppository 10 mg  10 mg Rectal Q12H PRN Michael Boston, MD      . diphenhydrAMINE (BENADRYL) injection 12.5-25 mg  12.5-25 mg Intravenous Q6H PRN Michael Boston, MD      . HYDROmorphone (DILAUDID) injection 0.5-2 mg  0.5-2 mg Intravenous Q2H PRN Michael Boston, MD      . lactated ringers bolus 1,000 mL  1,000 mL Intravenous Once Michael Boston, MD      . lactated ringers bolus 1,000 mL  1,000 mL Intravenous Q8H PRN Michael Boston, MD      . lip balm (CARMEX) ointment 1 application  1 application Topical BID Michael Boston, MD      . LORazepam (ATIVAN) injection 0.5-1 mg  0.5-1 mg Intravenous Q8H PRN Michael Boston, MD      . magic mouthwash  15 mL Oral QID PRN Michael Boston, MD      . menthol-cetylpyridinium (CEPACOL) lozenge 3 mg  1 lozenge Oral PRN Michael Boston, MD      . methocarbamol (ROBAXIN) 1,000 mg in dextrose 5 % 50 mL IVPB  1,000 mg Intravenous Q6H PRN Michael Boston, MD      . ondansetron [facility]) injection 4 mg  4 mg Intravenous Q6H PRN Michael Boston, MD       Or  . ondansetron (ZOFRAN) 8 mg in sodium chloride 0.9 % 50 mL IVPB  8 mg Intravenous Q6H PRN Michael Boston, MD      . ondansetron [facility]) injection 4 mg  4 mg Intravenous Q4H PRN Michael Boston, MD      . oxyCODONE (Oxy IR/ROXICODONE) immediate release tablet 5-10 mg  5-10  mg Oral Q4H PRN Michael Boston, MD      . phenol (CHLORASEPTIC) mouth spray 2 spray  2 spray Mouth/Throat PRN Michael Boston, MD      . prochlorperazine (COMPAZINE) injection 5-10 mg  5-10 mg Intravenous Q4H PRN Michael Boston, MD      . sodium chloride 0.9 % 1,000 mL infusion   Intravenous Continuous Michael Boston, MD       Current Outpatient Prescriptions  Medication Sig Dispense Refill  . calcium citrate-vitamin D (CITRACAL+D) 315-200 MG-UNIT per tablet Take 1 tablet by mouth 2 (two) times daily. She takes Calcium Vitamin D 600-800 1 tab BID    . cyclobenzaprine (FLEXERIL) 10 MG tablet TAKE 1 TABLET BY MOUTH 3 TIMES A DAY AS NEEDED FOR MUSCLE SPASM 90 tablet 2  . gabapentin (NEURONTIN) 300 MG capsule TAKE 2 CAPSULES BY MOUTH 3 TIMES A DAY 180 capsule 3  . QUEtiapine (SEROQUEL XR) 400 MG 24 hr tablet Take 400 mg by mouth at bedtime.    . traMADol (ULTRAM) 50 MG tablet Take 50 mg by mouth every 6 (six) hours as needed for moderate pain or severe pain.    . vitamin B-12 (CYANOCOBALAMIN) 500 MCG tablet Take 500 mcg by mouth daily.    . CHANTIX STARTING MONTH PAK 0.5 MG X 11 & 1 MG X 42 tablet TAKE AS DIRECTED (Patient not taking: Reported on 07/15/2016) 53 tablet 0     Allergies  Allergen Reactions  . Erythrocin Other (See Comments)    Thrush  . Sulfa Antibiotics Other (See Comments)    Thrush   . Trazodone And Nefazodone   . Tramadol Itching    ROS: Constitutional:  No fevers, chills, sweats.  Weight stable Eyes:  No vision changes, No discharge HENT:  No sore throats, nasal drainage Lymph: No neck swelling, No bruising easily Pulmonary:  No cough, productive sputum CV: No orthopnea, PND  Patient walks 20 minutes without difficulty.  No exertional chest/neck/shoulder/arm pain. GI:  No personal nor family history of GI/colon cancer, inflammatory bowel disease, irritable bowel syndrome, allergy such as Celiac Sprue, dietary/dairy problems, colitis, ulcers nor gastritis.  No recent sick  contacts/gastroenteritis.  No travel outside the country.  No changes in diet.  Sessile serrated adenoma of cecum removed 2011. Renal: No UTIs, No hematuria Genital:  No drainage, bleeding, masses Musculoskeletal: No severe joint pain.  Good ROM major joints.  Mild lateral neck soreness posteriorly Skin:  No sores or lesions.  No rashes Heme/Lymph:  No easy bleeding.  No swollen lymph nodes Neuro: No focal weakness/numbness.  No seizures.  Felt lightheaded with syncopal event Psych: No suicidal ideation.  No hallucinations  BP 122/87   Pulse 95   Temp (!) 96.6 F (35.9 C) (Axillary)   Resp 26   SpO2 95%   Physical Exam: General: Pt awake/alert/oriented x4 in mild acute distress Eyes: PERRL, normal EOM. Sclera nonicteric Neuro: CN II-XII intact w/o focal sensory/motor deficits. Lymph: No head/neck/groin lymphadenopathy Psych:  No delerium/psychosis/paranoia.  Calm.  Talkative. HENT: Normocephalic, Mucus membranes moist.  No thrush Neck: Supple, No tracheal deviation.  Not in a c-collar.  Mild discomfort along lateral neck but not along spinal tenderness.  Had very been cleared by emergency. Chest: No pain.  Good respiratory excursion. CV:  Pulses intact.  Regular rhythm  HR 95-105 Abdomen: Soft, Mildly distended.  Diffuse mild abdominal discomfort.  More prominent in left upper quadrant.  No true peritonitis though.  No umbilical or incisional hernias.  No incarcerated hernias. Genital.  No inguinal hernias.  No lymphadenopathy.  No vaginal bleeding. Ext:  SCDs BLE.  No significant edema.  No cyanosis Skin: No petechiae / purpurea.  No major sores Musculoskeletal: No severe joint pain.  No focal spinal pain.  No step offs Good ROM major joints   Results:   Labs: Results for orders placed or performed during the hospital encounter of 07/15/16 (from the past 48 hour(s))  Comprehensive metabolic panel     Status: Abnormal   Collection Time: 07/15/16  7:05 PM  Result Value Ref Range    Sodium 138 135 - 145 mmol/L   Potassium 5.3 (H) 3.5 - 5.1 mmol/L    Comment: NO VISIBLE HEMOLYSIS   Chloride 103 101 - 111 mmol/L   CO2 18 (L) 22 - 32 mmol/L   Glucose, Bld 218 (H) 65 - 99 mg/dL   BUN 14 6 - 20 mg/dL   Creatinine, Ser 1.02 (H) 0.44 - 1.00 mg/dL   Calcium 8.0 (L) 8.9 - 10.3 mg/dL   Total Protein 5.5 (L) 6.5 - 8.1 g/dL   Albumin 3.3 (L) 3.5 - 5.0 g/dL   AST 49 (H) 15 - 41 U/L   ALT 24 14 - 54 U/L   Alkaline Phosphatase 57 38 - 126 U/L   Total Bilirubin 1.2 0.3 - 1.2 mg/dL   GFR calc non Af Amer 59 (L) >60 mL/min   GFR calc Af Amer >60 >60 mL/min    Comment: (NOTE) The eGFR has been calculated using the CKD EPI equation. This calculation has not been validated in all clinical situations. eGFR's persistently <60 mL/min signify possible Chronic Kidney Disease.    Anion gap 17 (H) 5 - 15  CBC with Differential     Status: Abnormal   Collection Time: 07/15/16  7:05 PM  Result Value Ref Range   WBC 6.8 4.0 - 10.5 K/uL   RBC 2.48 (L) 3.87 - 5.11 MIL/uL   Hemoglobin 9.2 (L) 12.0 - 15.0 g/dL   HCT 27.6 (L) 36.0 - 46.0 %   MCV 111.3 (H) 78.0 - 100.0 fL   MCH 37.1 (H) 26.0 - 34.0 pg   MCHC 33.3 30.0 - 36.0 g/dL   RDW 14.8 11.5 - 15.5 %   Platelets 133 (L) 150 - 400 K/uL   Neutrophils Relative % 66 %   Neutro Abs 4.5 1.7 - 7.7 K/uL   Lymphocytes Relative 24 %   Lymphs Abs 1.6 0.7 - 4.0 K/uL   Monocytes Relative 10 %   Monocytes Absolute 0.7 0.1 - 1.0 K/uL   Eosinophils Relative 0 %   Eosinophils Absolute 0.0 0.0 - 0.7 K/uL   Basophils Relative 0 %   Basophils Absolute 0.0 0.0 - 0.1 K/uL   Smear Review MORPHOLOGY UNREMARKABLE   I-stat Chem 8, ED     Status: Abnormal   Collection Time: 07/15/16  7:15 PM  Result Value Ref Range   Sodium 137 135 - 145 mmol/L   Potassium 5.3 (H) 3.5 - 5.1 mmol/L   Chloride 103 101 - 111 mmol/L   BUN 12 6 - 20 mg/dL   Creatinine, Ser 0.90 0.44 - 1.00 mg/dL   Glucose, Bld 210 (H) 65 - 99 mg/dL   Calcium, Ion 0.97 (L) 1.13 - 1.30  mmol/L   TCO2 18 0 - 100 mmol/L   Hemoglobin 9.9 (L) 12.0 - 15.0 g/dL   HCT 29.0 (L) 36.0 - 46.0 %  Type and screen     Status: None   Collection Time: 07/15/16  9:31 PM  Result Value Ref Range   ABO/RH(D) O POS    Antibody Screen NEG    Sample Expiration 07/18/2016     Imaging / Studies: Ct Head Wo Contrast  Result Date: 07/15/2016 CLINICAL DATA:  Syncope at home leading to fall. EXAM: CT HEAD WITHOUT CONTRAST TECHNIQUE: Contiguous axial images were obtained from the base of the skull through the vertex  without intravenous contrast. COMPARISON:  07/20/2012 FINDINGS: Brain: No intracranial hemorrhage, mass effect, or midline shift. No hydrocephalus. The basilar cisterns are patent. No evidence of territorial infarct. Minimal basal gangliar calcifications are unchanged. No intracranial fluid collection. Minimal generalized atrophy. Vascular: No hyperdense vessel or abnormal calcification. Skull: Small laceration posteriorly with subcutaneous air. No fracture. Calvarium is intact. Sinuses/Orbits: Remote fracture of the medial wall of the left orbit versus laminar appreciated dehiscence, however is unchanged from prior. No acute findings. Mastoid air cells are well aerated. Other: None. IMPRESSION: No acute intracranial abnormality. Electronically Signed   By: Jeb Levering M.D.   On: 07/15/2016 20:59   Ct Abdomen Pelvis W Contrast  Addendum Date: 07/15/2016   ADDENDUM REPORT: 07/15/2016 21:33 ADDENDUM: Correction, impression # 2 should read: "2.  Moderate volume HEMOPERITONEUM in the abdomen and pelvis." This was discussed by telephone with Trauma Surgery Dr. Johney Maine On 07/15/2016 at 21:32 . Electronically Signed   By: Genevie Ann M.D.   On: 07/15/2016 21:33   Result Date: 07/15/2016 CLINICAL DATA:  61 year old female with generalized abdominal pain after colonoscopy today. Hypotension at presentation. Syncope and fall. Initial encounter. EXAM: CT ABDOMEN AND PELVIS WITH CONTRAST TECHNIQUE: Multidetector  CT imaging of the abdomen and pelvis was performed using the standard protocol following bolus administration of intravenous contrast. CONTRAST:  160m ISOVUE-300 IOPAMIDOL (ISOVUE-300) INJECTION 61% COMPARISON:  CT Abdomen and Pelvis 06/11/2010. FINDINGS: Negative lung bases aside from mild atelectasis. No pericardial or pleural effusion. No acute osseous abnormality identified. Moderate to large volume of free fluid in the pelvis. Moderate volume of free fluid in the abdomen, and abnormal appearance of the spleen with surrounding hemorrhage (series 2, image 22). Evidence of active extravasation on series 2, image 25, 0 and on delayed images there is a focus of contrast extravasation seen on series 5, image 6. No free air or extraluminal gas identified in the abdomen. The large bowel is predominantly decompressed, including the splenic flexure which is located just posterior and inferior to the spleen. Oral contrast was administered. No dilated small bowel. Negative stomach and duodenum. Hepatic steatosis. Liver enhancement otherwise within normal limits. Negative gallbladder, pancreas and adrenal glands. Portal venous system and splenic vein are patent. Aortoiliac calcified atherosclerosis noted. Major arterial structures in the abdomen and pelvis are patent. Sequelae of hysterectomy. adnexa are obscured by the pelvic free fluid. Diminutive urinary bladder. No lymphadenopathy identified. Renal enhancement and contrast excretion within normal limits. The IVC is slit-like in the upper abdomen. IMPRESSION: 1. Splenic Rupture with Active Extravasation. Preliminary report of this critical finding discussed by telephone with Dr. MThreasa BeardsBELFI on 07/15/2016 at 2054 hours. 2. Moderate volume pneumoperitoneum in the abdomen and pelvis. 3. No pneumoperitoneum or evidence of bowel perforation identified. 4. No other acute finding in the abdomen or pelvis. Calcified aortic atherosclerosis. Electronically Signed: By: HGenevie Ann M.D. On: 07/15/2016 21:01    Medications / Allergies: per chart  Antibiotics: Anti-infectives    None      Assessment  RNataliyah PackhamNall  60y.o. female       Problem List:  Principal Problem:   Splenic rupture Active Problems:   Bipolar disorder ([place]   Serrated adenoma of cecum s/p polypectomy 2011   Significant splenic rupture with extravasation contained in the capsule.  Hypertension resolved with 1 L of crystalloid.  Plan:  Admit to intensive care unit.  [facility]  Serial abdominal examinations.  Serial hemoglobin checks.  T&S.  Probable need for blood transfusion if worsening hypertension or falling hemoglobin.  Embolization vs  splenectomy if refractory to volume replacement including blood.  The anatomy and physiology of the spleen was discussed.  Pathophysiology of the disease was discussed.  Options were discussed, and I made a recommendation to remove the spleen to help treat the pathology if nonoperative management does not work..    Risks of bleeding, infection, injury to other organs, reoperation, death, and other risks were discussed.   I noted a good likelihood this will help address the problem.  We will try nonoperative management first including IV fluids and possibly blood transfusions.  We will work to minimize complications.    When necessary lorazepam to watch out for alcohol protocol.  Do not think need CIWA protocol.  Check pro time for hepatic function.  VTE prophylaxis- SCDs only.  Hold all anticoagulation.  Bedrest  I updated the patient's status to the patient, nurse, and family.  Recommendations were made.  Questions were answered.  They expressed understanding & appreciation.  D/w Dr Redmond Pulling with [facility] Trauma service whom with assume care     Adin Hector, M.D., F.A.C.S. Gastrointestinal and Minimally Invasive Surgery [facility] [address] [place], [place]  [phone] Main / Paging   07/15/2016  Note: Portions of this report may have been transcribed using voice recognition software. Every effort was made to ensure accuracy; however, inadvertent computerized transcription errors may be present.   Any transcriptional errors that result from this process are unintentional.

## 2016-07-15 NOTE — Progress Notes (Signed)
Pt just arrived to [facility] a few minutes ago States that she is sore and tender on left abdomen and left back No HA Just tired, very long day.   BP 71/40   Pulse 89   Temp (!) 96.6 F (35.9 C) (Axillary)   Resp 22   SpO2 96%  Repeat BP 81/50  Awake alert A little pale Not ill appearing cta Reg Soft, mild distension, mild TTP, no rebound/guarding GCS15  H/o colonoscopy 8/3 Fall with syncopal episode Splenic rupture with active extravasation/hemoperitoneum Anemia Bipolar d/o Tobacco use  Npo Fluid bolus for hypoTN Maintain pIV x2 Stat Type and screen - has to be redone since changed facilities - discussed with blood bank Stat cbc Had discussed with IR attending Dr Vernard Gambles just prior to coming to see pt and had planned to send to IR later this am for angio and embolization given active extravasation since she was normotensive and stable at [facility]; however, may not be able to wait til am.  Discussed with pt and sister that I doubt observation alone will be successful. Discussed splenectomy vs IR embolization. rec proceeding with angio/embolization - timing to TBD  Need to get new type and screen and cbc first so that we can have blood available if needed.  If hgb dropping and/or remains hypoTN will have to go to IR sooner  Leighton Ruff. Redmond Pulling, MD, FACS General, Bariatric, & Minimally Invasive Surgery [facility], [place]

## 2016-07-16 ENCOUNTER — Inpatient Hospital Stay (HOSPITAL_COMMUNITY): Payer: Medicare Other

## 2016-07-16 ENCOUNTER — Encounter (HOSPITAL_COMMUNITY): Payer: Self-pay | Admitting: Interventional Radiology

## 2016-07-16 HISTORY — PX: IR GENERIC HISTORICAL: IMG1180011

## 2016-07-16 LAB — URINALYSIS, ROUTINE W REFLEX MICROSCOPIC
Bilirubin Urine: NEGATIVE
GLUCOSE, UA: NEGATIVE mg/dL
Hgb urine dipstick: NEGATIVE
Ketones, ur: NEGATIVE mg/dL
LEUKOCYTES UA: NEGATIVE
Nitrite: NEGATIVE
PH: 6 (ref 5.0–8.0)
PROTEIN: NEGATIVE mg/dL
SPECIFIC GRAVITY, URINE: 1.016 (ref 1.005–1.030)

## 2016-07-16 LAB — CBC
HCT: 29.2 % — ABNORMAL LOW (ref 36.0–46.0)
HCT: 24.5 % — ABNORMAL LOW (ref 36.0–46.0)
HEMATOCRIT: 17.9 % — AB (ref 36.0–46.0)
HEMOGLOBIN: 5.8 g/dL — AB (ref 12.0–15.0)
HEMOGLOBIN: 9.9 g/dL — AB (ref 12.0–15.0)
Hemoglobin: 8.3 g/dL — ABNORMAL LOW (ref 12.0–15.0)
MCH: 32.9 pg (ref 26.0–34.0)
MCH: 33.4 pg (ref 26.0–34.0)
MCH: 36.5 pg — AB (ref 26.0–34.0)
MCHC: 32.4 g/dL (ref 30.0–36.0)
MCHC: 33.9 g/dL (ref 30.0–36.0)
MCHC: 33.9 g/dL (ref 30.0–36.0)
MCV: 112.6 fL — ABNORMAL HIGH (ref 78.0–100.0)
MCV: 98.6 fL (ref 78.0–100.0)
MCV: 97.2 fL (ref 78.0–100.0)
PLATELETS: 96 10*3/uL — AB (ref 150–400)
PLATELETS: 85 10*3/uL — AB (ref 150–400)
Platelets: 107 10*3/uL — ABNORMAL LOW (ref 150–400)
RBC: 1.59 MIL/uL — ABNORMAL LOW (ref 3.87–5.11)
RBC: 2.96 MIL/uL — ABNORMAL LOW (ref 3.87–5.11)
RBC: 2.52 MIL/uL — AB (ref 3.87–5.11)
RDW: 14.9 % (ref 11.5–15.5)
RDW: 18.3 % — AB (ref 11.5–15.5)
RDW: 18.7 % — AB (ref 11.5–15.5)
WBC: 5.3 10*3/uL (ref 4.0–10.5)
WBC: 7.3 10*3/uL (ref 4.0–10.5)
WBC: 8.4 10*3/uL (ref 4.0–10.5)

## 2016-07-16 LAB — PROTIME-INR
INR: 0.97
INR: 1.31
PROTHROMBIN TIME: 16.4 s — AB (ref 11.4–15.2)
Prothrombin Time: 12.9 seconds (ref 11.4–15.2)

## 2016-07-16 LAB — APTT: aPTT: 24 seconds (ref 24–36)

## 2016-07-16 LAB — MRSA PCR SCREENING: MRSA BY PCR: NEGATIVE

## 2016-07-16 LAB — FIBRINOGEN: Fibrinogen: 261 mg/dL (ref 210–475)

## 2016-07-16 LAB — ABO/RH: ABO/RH(D): O POS

## 2016-07-16 LAB — PREPARE RBC (CROSSMATCH)

## 2016-07-16 MED ORDER — OXYCODONE HCL 5 MG PO TABS
5.0000 mg | ORAL_TABLET | ORAL | Status: DC | PRN
  Administered 2016-07-16: 10 mg via ORAL
  Administered 2016-07-16: 5 mg via ORAL
  Administered 2016-07-16 – 2016-07-17 (×5): 10 mg via ORAL
  Administered 2016-07-18: 5 mg via ORAL
  Administered 2016-07-18: 10 mg via ORAL
  Administered 2016-07-18: 5 mg via ORAL
  Administered 2016-07-19: 10 mg via ORAL
  Administered 2016-07-19 (×2): 5 mg via ORAL
  Administered 2016-07-20 (×2): 10 mg via ORAL
  Filled 2016-07-16: qty 2
  Filled 2016-07-16 (×2): qty 1
  Filled 2016-07-16 (×6): qty 2
  Filled 2016-07-16 (×2): qty 1
  Filled 2016-07-16 (×3): qty 2
  Filled 2016-07-16: qty 1
  Filled 2016-07-16 (×2): qty 2

## 2016-07-16 MED ORDER — MIDAZOLAM HCL 2 MG/2ML IJ SOLN
INTRAMUSCULAR | Status: AC
  Filled 2016-07-16: qty 2

## 2016-07-16 MED ORDER — FENTANYL CITRATE (PF) 100 MCG/2ML IJ SOLN
INTRAMUSCULAR | Status: AC | PRN
  Administered 2016-07-16 (×2): 25 ug via INTRAVENOUS

## 2016-07-16 MED ORDER — DIPHENHYDRAMINE HCL 50 MG/ML IJ SOLN: 12.5000 mg | Freq: Four times a day (QID) | INTRAMUSCULAR | Status: DC | PRN

## 2016-07-16 MED ORDER — MIDAZOLAM HCL 2 MG/2ML IJ SOLN
INTRAMUSCULAR | Status: AC | PRN
  Administered 2016-07-16: 1 mg via INTRAVENOUS

## 2016-07-16 MED ORDER — LIDOCAINE HCL 1 % IJ SOLN
INTRAMUSCULAR | Status: AC
  Administered 2016-07-16: 20 mL
  Filled 2016-07-16: qty 20

## 2016-07-16 MED ORDER — IOPAMIDOL (ISOVUE-300) INJECTION 61%
INTRAVENOUS | Status: AC
  Administered 2016-07-16: 60 mL
  Filled 2016-07-16: qty 150

## 2016-07-16 MED ORDER — FENTANYL CITRATE (PF) 100 MCG/2ML IJ SOLN
INTRAMUSCULAR | Status: AC
  Filled 2016-07-16: qty 2

## 2016-07-16 NOTE — Sedation Documentation (Addendum)
Second infusion of RBC completed at 0200. No adverse reactions noted

## 2016-07-16 NOTE — Sedation Documentation (Signed)
Patient denies pain and is resting comfortably.  

## 2016-07-16 NOTE — Procedures (Signed)
Splenic artery embolization No complication No blood loss. See complete dictation in [facility].

## 2016-07-16 NOTE — Progress Notes (Signed)
Patient ID: Monique Perry, female   DOB: 23-Jul-1956, 60 y.o.   MRN: [id number]    Referring Physician(s): Ok Anis  Supervising Physician: Corrie Mckusick  Patient Status:  Inpatient  Chief Complaint:  Splenic rupture  Subjective: Pt feeling better now since embolization of splenic artery earlier this am; still has some mild LUQ discomfort   Allergies: Erythrocin; Sulfa antibiotics; Trazodone and nefazodone; and Tramadol  Medications: Prior to Admission medications   Medication Sig Start Date End Date Taking? Authorizing Provider  calcium citrate-vitamin D (CITRACAL+D) 315-200 MG-UNIT per tablet Take 1 tablet by mouth 2 (two) times daily. She takes Calcium Vitamin D 600-800 1 tab BID   Yes Historical Provider, MD  cyclobenzaprine (FLEXERIL) 10 MG tablet TAKE 1 TABLET BY MOUTH 3 TIMES A DAY AS NEEDED FOR MUSCLE SPASM 11/21/14  Yes Langley Gauss Moding, MD  gabapentin (NEURONTIN) 300 MG capsule TAKE 2 CAPSULES BY MOUTH 3 TIMES A DAY   Yes Gerda Diss, DO  QUEtiapine (SEROQUEL XR) 400 MG 24 hr tablet Take 400 mg by mouth at bedtime.   Yes Historical Provider, MD  traMADol (ULTRAM) 50 MG tablet Take 50 mg by mouth every 6 (six) hours as needed for moderate pain or severe pain.   Yes Historical Provider, MD  vitamin B-12 (CYANOCOBALAMIN) 500 MCG tablet Take 500 mcg by mouth daily.   Yes Historical Provider, MD  CHANTIX STARTING MONTH PAK 0.5 MG X 11 & 1 MG X 42 tablet TAKE AS DIRECTED Patient not taking: Reported on 07/15/2016 12/31/14   Langley Gauss Moding, MD     Vital Signs: BP (!) 138/93   Pulse (!) 164   Temp 97.3 F (36.3 C) (Oral)   Resp 12   Ht 5\' 6"  (1.676 m)   Wt 145 lb 1 oz (65.8 kg)   SpO2 98%   BMI 23.41 kg/m   Physical Exam awake/alert; abd soft,+BS,mildly tender LUQ; puncture site rt CFA clean, dry, soft,NT, no hematoma; intact distal pulses  Imaging: Ct Head Wo Contrast  Result Date: 07/15/2016 CLINICAL DATA:  Syncope at home leading to fall. EXAM: CT HEAD  WITHOUT CONTRAST TECHNIQUE: Contiguous axial images were obtained from the base of the skull through the vertex without intravenous contrast. COMPARISON:  07/20/2012 FINDINGS: Brain: No intracranial hemorrhage, mass effect, or midline shift. No hydrocephalus. The basilar cisterns are patent. No evidence of territorial infarct. Minimal basal gangliar calcifications are unchanged. No intracranial fluid collection. Minimal generalized atrophy. Vascular: No hyperdense vessel or abnormal calcification. Skull: Small laceration posteriorly with subcutaneous air. No fracture. Calvarium is intact. Sinuses/Orbits: Remote fracture of the medial wall of the left orbit versus laminar appreciated dehiscence, however is unchanged from prior. No acute findings. Mastoid air cells are well aerated. Other: None. IMPRESSION: No acute intracranial abnormality. Electronically Signed   By: Jeb Levering M.D.   On: 07/15/2016 20:59   Ct Abdomen Pelvis W Contrast  Addendum Date: 07/15/2016   ADDENDUM REPORT: 07/15/2016 21:33 ADDENDUM: Correction, impression # 2 should read: "2.  Moderate volume HEMOPERITONEUM in the abdomen and pelvis." This was discussed by telephone with Trauma Surgery Dr. Johney Maine On 07/15/2016 at 21:32 . Electronically Signed   By: Genevie Ann M.D.   On: 07/15/2016 21:33   Result Date: 07/15/2016 CLINICAL DATA:  60 year old female with generalized abdominal pain after colonoscopy today. Hypotension at presentation. Syncope and fall. Initial encounter. EXAM: CT ABDOMEN AND PELVIS WITH CONTRAST TECHNIQUE: Multidetector CT imaging of the abdomen and pelvis was performed using the standard  protocol following bolus administration of intravenous contrast. CONTRAST:  141mL ISOVUE-300 IOPAMIDOL (ISOVUE-300) INJECTION 61% COMPARISON:  CT Abdomen and Pelvis 06/11/2010. FINDINGS: Negative lung bases aside from mild atelectasis. No pericardial or pleural effusion. No acute osseous abnormality identified. Moderate to large volume of  free fluid in the pelvis. Moderate volume of free fluid in the abdomen, and abnormal appearance of the spleen with surrounding hemorrhage (series 2, image 22). Evidence of active extravasation on series 2, image 25, 0 and on delayed images there is a focus of contrast extravasation seen on series 5, image 6. No free air or extraluminal gas identified in the abdomen. The large bowel is predominantly decompressed, including the splenic flexure which is located just posterior and inferior to the spleen. Oral contrast was administered. No dilated small bowel. Negative stomach and duodenum. Hepatic steatosis. Liver enhancement otherwise within normal limits. Negative gallbladder, pancreas and adrenal glands. Portal venous system and splenic vein are patent. Aortoiliac calcified atherosclerosis noted. Major arterial structures in the abdomen and pelvis are patent. Sequelae of hysterectomy. adnexa are obscured by the pelvic free fluid. Diminutive urinary bladder. No lymphadenopathy identified. Renal enhancement and contrast excretion within normal limits. The IVC is slit-like in the upper abdomen. IMPRESSION: 1. Splenic Rupture with Active Extravasation. Preliminary report of this critical finding discussed by telephone with Dr. Threasa Beards BELFI on 07/15/2016 at 2054 hours. 2. Moderate volume pneumoperitoneum in the abdomen and pelvis. 3. No pneumoperitoneum or evidence of bowel perforation identified. 4. No other acute finding in the abdomen or pelvis. Calcified aortic atherosclerosis. Electronically Signed: By: Genevie Ann M.D. On: 07/15/2016 21:01   Ir Angiogram Visceral Selective  Result Date: 07/16/2016 CLINICAL DATA:  Abdominal pain post colonoscopy. Fell. CT demonstrates splenic laceration with active extravasation. Continued decrease in H and H. Embolization requested. EXAM: IR EMBO ART VEN HEMORR LYMPH EXTRAV INC GUIDE ROADMAPPING; IR ULTRASOUND GUIDANCE VASC ACCESS RIGHT; SELECTIVE VISCERAL ARTERIOGRAPHY;  ARTERIOGRAPHY; ADDITIONAL ARTERIOGRAPHY ANESTHESIA/SEDATION: Intravenous Fentanyl and Versed were administered as conscious sedation during continuous monitoring of the patient's level of consciousness and physiological / cardiorespiratory status by the radiology RN, with a total moderate sedation time of 39 minutes. MEDICATIONS: Lidocaine 1% subcutaneous CONTRAST:  68mL ISOVUE-300 IOPAMIDOL (ISOVUE-300) INJECTION 61% PROCEDURE: The procedure, risks (including but not limited to bleeding, infection, organ damage ), benefits, and alternatives were explained to the patient and family. Questions regarding the procedure were encouraged and answered. The patient understands and consents to the procedure. Right femoral region prepped and draped in usual sterile fashion. Maximal barrier sterile technique was utilized including caps, mask, sterile gowns, sterile gloves, sterile drape, hand hygiene and skin antiseptic. The right common femoral artery was localized under ultrasound. Under real-time ultrasound guidance, the vessel was accessed with a 21-gauge micropuncture needle, exchanged over a 018 guidewire for a transitional dilator, through which a 035 guidewire was advanced. Over this, a 5 [place] vascular sheath was placed, through which a 5 [place] C2 catheter was advanced and used to selectively catheterize the celiac axis. The catheter is advanced into the proximal splenic artery for selective arteriography. A coaxial renegade micro catheter was advanced with the aid of a Fathom guidewire to the distal main splenic artery. After confirmatory arteriography, coil embolization of the splenic artery was performed using 4, 5, and 6 mm interlock coils. Intermittent arteriography was performed to assess progress of embolization. At the conclusion of the procedure, R tear re- through the micro catheter and diagnostic catheter demonstrate occlusion of the main splenic artery. Persistent  collateral flow results in some  continued low level perfusion to distal splenic artery branches. The catheter and sheath were removed and hemostasis achieved with the aid of the Exoseal device after confirmatory femoral arteriography. The patient tolerated the procedure well. FLUOROSCOPY TIME:  11 minutes 48 seconds, 123456 mGy COMPLICATIONS: None immediate FINDINGS: No definite active extravasation was identified on limited arteriography. Coil embolization of the distal main splenic artery was performed without complication. IMPRESSION: 1. Technically successful coil embolization of the main splenic artery. Electronically Signed   By: Lucrezia Europe M.D.   On: 07/16/2016 08:57   Ir Angiogram Selective Each Additional Vessel  Result Date: 07/16/2016 CLINICAL DATA:  Abdominal pain post colonoscopy. Fell. CT demonstrates splenic laceration with active extravasation. Continued decrease in H and H. Embolization requested. EXAM: IR EMBO ART VEN HEMORR LYMPH EXTRAV INC GUIDE ROADMAPPING; IR ULTRASOUND GUIDANCE VASC ACCESS RIGHT; SELECTIVE VISCERAL ARTERIOGRAPHY; ARTERIOGRAPHY; ADDITIONAL ARTERIOGRAPHY ANESTHESIA/SEDATION: Intravenous Fentanyl and Versed were administered as conscious sedation during continuous monitoring of the patient's level of consciousness and physiological / cardiorespiratory status by the radiology RN, with a total moderate sedation time of 39 minutes. MEDICATIONS: Lidocaine 1% subcutaneous CONTRAST:  81mL ISOVUE-300 IOPAMIDOL (ISOVUE-300) INJECTION 61% PROCEDURE: The procedure, risks (including but not limited to bleeding, infection, organ damage ), benefits, and alternatives were explained to the patient and family. Questions regarding the procedure were encouraged and answered. The patient understands and consents to the procedure. Right femoral region prepped and draped in usual sterile fashion. Maximal barrier sterile technique was utilized including caps, mask, sterile gowns, sterile gloves, sterile drape, hand hygiene and skin  antiseptic. The right common femoral artery was localized under ultrasound. Under real-time ultrasound guidance, the vessel was accessed with a 21-gauge micropuncture needle, exchanged over a 018 guidewire for a transitional dilator, through which a 035 guidewire was advanced. Over this, a 5 [place] vascular sheath was placed, through which a 5 [place] C2 catheter was advanced and used to selectively catheterize the celiac axis. The catheter is advanced into the proximal splenic artery for selective arteriography. A coaxial renegade micro catheter was advanced with the aid of a Fathom guidewire to the distal main splenic artery. After confirmatory arteriography, coil embolization of the splenic artery was performed using 4, 5, and 6 mm interlock coils. Intermittent arteriography was performed to assess progress of embolization. At the conclusion of the procedure, R tear re- through the micro catheter and diagnostic catheter demonstrate occlusion of the main splenic artery. Persistent collateral flow results in some continued low level perfusion to distal splenic artery branches. The catheter and sheath were removed and hemostasis achieved with the aid of the Exoseal device after confirmatory femoral arteriography. The patient tolerated the procedure well. FLUOROSCOPY TIME:  11 minutes 48 seconds, 123456 mGy COMPLICATIONS: None immediate FINDINGS: No definite active extravasation was identified on limited arteriography. Coil embolization of the distal main splenic artery was performed without complication. IMPRESSION: 1. Technically successful coil embolization of the main splenic artery. Electronically Signed   By: Lucrezia Europe M.D.   On: 07/16/2016 08:57   Ir Angiogram Follow Up Study  Result Date: 07/16/2016 CLINICAL DATA:  Abdominal pain post colonoscopy. Fell. CT demonstrates splenic laceration with active extravasation. Continued decrease in H and H. Embolization requested. EXAM: IR EMBO ART VEN HEMORR LYMPH EXTRAV  INC GUIDE ROADMAPPING; IR ULTRASOUND GUIDANCE VASC ACCESS RIGHT; SELECTIVE VISCERAL ARTERIOGRAPHY; ARTERIOGRAPHY; ADDITIONAL ARTERIOGRAPHY ANESTHESIA/SEDATION: Intravenous Fentanyl and Versed were administered as conscious sedation during continuous monitoring of the patient's level  of consciousness and physiological / cardiorespiratory status by the radiology RN, with a total moderate sedation time of 39 minutes. MEDICATIONS: Lidocaine 1% subcutaneous CONTRAST:  38mL ISOVUE-300 IOPAMIDOL (ISOVUE-300) INJECTION 61% PROCEDURE: The procedure, risks (including but not limited to bleeding, infection, organ damage ), benefits, and alternatives were explained to the patient and family. Questions regarding the procedure were encouraged and answered. The patient understands and consents to the procedure. Right femoral region prepped and draped in usual sterile fashion. Maximal barrier sterile technique was utilized including caps, mask, sterile gowns, sterile gloves, sterile drape, hand hygiene and skin antiseptic. The right common femoral artery was localized under ultrasound. Under real-time ultrasound guidance, the vessel was accessed with a 21-gauge micropuncture needle, exchanged over a 018 guidewire for a transitional dilator, through which a 035 guidewire was advanced. Over this, a 5 [place] vascular sheath was placed, through which a 5 [place] C2 catheter was advanced and used to selectively catheterize the celiac axis. The catheter is advanced into the proximal splenic artery for selective arteriography. A coaxial renegade micro catheter was advanced with the aid of a Fathom guidewire to the distal main splenic artery. After confirmatory arteriography, coil embolization of the splenic artery was performed using 4, 5, and 6 mm interlock coils. Intermittent arteriography was performed to assess progress of embolization. At the conclusion of the procedure, R tear re- through the micro catheter and diagnostic catheter  demonstrate occlusion of the main splenic artery. Persistent collateral flow results in some continued low level perfusion to distal splenic artery branches. The catheter and sheath were removed and hemostasis achieved with the aid of the Exoseal device after confirmatory femoral arteriography. The patient tolerated the procedure well. FLUOROSCOPY TIME:  11 minutes 48 seconds, 123456 mGy COMPLICATIONS: None immediate FINDINGS: No definite active extravasation was identified on limited arteriography. Coil embolization of the distal main splenic artery was performed without complication. IMPRESSION: 1. Technically successful coil embolization of the main splenic artery. Electronically Signed   By: Lucrezia Europe M.D.   On: 07/16/2016 08:57   Ir US Guide Vasc Access Right  Result Date: 07/16/2016 CLINICAL DATA:  Abdominal pain post colonoscopy. Fell. CT demonstrates splenic laceration with active extravasation. Continued decrease in H and H. Embolization requested. EXAM: IR EMBO ART VEN HEMORR LYMPH EXTRAV INC GUIDE ROADMAPPING; IR ULTRASOUND GUIDANCE VASC ACCESS RIGHT; SELECTIVE VISCERAL ARTERIOGRAPHY; ARTERIOGRAPHY; ADDITIONAL ARTERIOGRAPHY ANESTHESIA/SEDATION: Intravenous Fentanyl and Versed were administered as conscious sedation during continuous monitoring of the patient's level of consciousness and physiological / cardiorespiratory status by the radiology RN, with a total moderate sedation time of 39 minutes. MEDICATIONS: Lidocaine 1% subcutaneous CONTRAST:  96mL ISOVUE-300 IOPAMIDOL (ISOVUE-300) INJECTION 61% PROCEDURE: The procedure, risks (including but not limited to bleeding, infection, organ damage ), benefits, and alternatives were explained to the patient and family. Questions regarding the procedure were encouraged and answered. The patient understands and consents to the procedure. Right femoral region prepped and draped in usual sterile fashion. Maximal barrier sterile technique was utilized including  caps, mask, sterile gowns, sterile gloves, sterile drape, hand hygiene and skin antiseptic. The right common femoral artery was localized under ultrasound. Under real-time ultrasound guidance, the vessel was accessed with a 21-gauge micropuncture needle, exchanged over a 018 guidewire for a transitional dilator, through which a 035 guidewire was advanced. Over this, a 5 [place] vascular sheath was placed, through which a 5 [place] C2 catheter was advanced and used to selectively catheterize the celiac axis. The catheter is advanced into the proximal splenic  artery for selective arteriography. A coaxial renegade micro catheter was advanced with the aid of a Fathom guidewire to the distal main splenic artery. After confirmatory arteriography, coil embolization of the splenic artery was performed using 4, 5, and 6 mm interlock coils. Intermittent arteriography was performed to assess progress of embolization. At the conclusion of the procedure, R tear re- through the micro catheter and diagnostic catheter demonstrate occlusion of the main splenic artery. Persistent collateral flow results in some continued low level perfusion to distal splenic artery branches. The catheter and sheath were removed and hemostasis achieved with the aid of the Exoseal device after confirmatory femoral arteriography. The patient tolerated the procedure well. FLUOROSCOPY TIME:  11 minutes 48 seconds, 123456 mGy COMPLICATIONS: None immediate FINDINGS: No definite active extravasation was identified on limited arteriography. Coil embolization of the distal main splenic artery was performed without complication. IMPRESSION: 1. Technically successful coil embolization of the main splenic artery. Electronically Signed   By: Lucrezia Europe M.D.   On: 07/16/2016 08:57   [place] Guide Roadmapping  Result Date: 07/16/2016 CLINICAL DATA:  Abdominal pain post colonoscopy. Fell. CT demonstrates splenic laceration with active  extravasation. Continued decrease in H and H. Embolization requested. EXAM: IR EMBO ART VEN HEMORR LYMPH EXTRAV INC GUIDE ROADMAPPING; IR ULTRASOUND GUIDANCE VASC ACCESS RIGHT; SELECTIVE VISCERAL ARTERIOGRAPHY; ARTERIOGRAPHY; ADDITIONAL ARTERIOGRAPHY ANESTHESIA/SEDATION: Intravenous Fentanyl and Versed were administered as conscious sedation during continuous monitoring of the patient's level of consciousness and physiological / cardiorespiratory status by the radiology RN, with a total moderate sedation time of 39 minutes. MEDICATIONS: Lidocaine 1% subcutaneous CONTRAST:  48mL ISOVUE-300 IOPAMIDOL (ISOVUE-300) INJECTION 61% PROCEDURE: The procedure, risks (including but not limited to bleeding, infection, organ damage ), benefits, and alternatives were explained to the patient and family. Questions regarding the procedure were encouraged and answered. The patient understands and consents to the procedure. Right femoral region prepped and draped in usual sterile fashion. Maximal barrier sterile technique was utilized including caps, mask, sterile gowns, sterile gloves, sterile drape, hand hygiene and skin antiseptic. The right common femoral artery was localized under ultrasound. Under real-time ultrasound guidance, the vessel was accessed with a 21-gauge micropuncture needle, exchanged over a 018 guidewire for a transitional dilator, through which a 035 guidewire was advanced. Over this, a 5 [place] vascular sheath was placed, through which a 5 [place] C2 catheter was advanced and used to selectively catheterize the celiac axis. The catheter is advanced into the proximal splenic artery for selective arteriography. A coaxial renegade micro catheter was advanced with the aid of a Fathom guidewire to the distal main splenic artery. After confirmatory arteriography, coil embolization of the splenic artery was performed using 4, 5, and 6 mm interlock coils. Intermittent arteriography was performed to assess progress of  embolization. At the conclusion of the procedure, R tear re- through the micro catheter and diagnostic catheter demonstrate occlusion of the main splenic artery. Persistent collateral flow results in some continued low level perfusion to distal splenic artery branches. The catheter and sheath were removed and hemostasis achieved with the aid of the Exoseal device after confirmatory femoral arteriography. The patient tolerated the procedure well. FLUOROSCOPY TIME:  11 minutes 48 seconds, 123456 mGy COMPLICATIONS: None immediate FINDINGS: No definite active extravasation was identified on limited arteriography. Coil embolization of the distal main splenic artery was performed without complication. IMPRESSION: 1. Technically successful coil embolization of the main splenic artery. Electronically Signed   By: Keturah Barre  Vernard Gambles M.D.   On: 07/16/2016 08:57    Labs:  CBC:  Recent Labs  07/15/16 1905 07/15/16 1915 07/16/16 0012 07/16/16 0810  WBC 6.8  --  5.3 7.3  HGB 9.2* 9.9* 5.8* 9.9*  HCT 27.6* 29.0* 17.9* 29.2*  PLT 133*  --  107* 96*    COAGS:  Recent Labs  07/15/16 2141 07/16/16 0012  INR 1.09 1.31  APTT 23* 24    BMP:  Recent Labs  07/15/16 1905 07/15/16 1915  NA 138 137  K 5.3* 5.3*  CL 103 103  CO2 18*  --   GLUCOSE 218* 210*  BUN 14 12  CALCIUM 8.0*  --   CREATININE 1.02* 0.90  GFRNONAA 59*  --   GFRAA >60  --     LIVER FUNCTION TESTS:  Recent Labs  07/15/16 1905  BILITOT 1.2  AST 49*  ALT 24  ALKPHOS 57  PROT 5.5*  ALBUMIN 3.3*    Assessment and Plan: Pt s/p splenic rupture post colonoscopy ; s/p splenic artery artery coil embolization 8/4; currently stable; AF; WBC 7.3; hgb 9.9, plts 96k; plans as per CCS; monitor H/H, lytes.    Electronically Signed: D. Rowe Robert 07/16/2016, 11:16 AM   I spent a total of 15 minutes at the the patient's bedside AND on the patient's hospital floor or unit, greater than 50% of which was counseling/coordinating care for  splenic artery embolization

## 2016-07-16 NOTE — Care Management Note (Signed)
Case Management Note  Patient Details  Name: Monique Perry MRN: [id number] Date of Birth: 1956-11-07  Subjective/Objective:   Pt admitted on 07/15/16 s/p fall with grade IV splenic laceration.  PTA, pt independent, lives with parents.                     Action/Plan: Will follow for discharge planning as pt progresses.    Expected Discharge Date:   (unknown)               Expected Discharge Plan:  Home/Self Care  In-House Referral:     Discharge planning Services  CM Consult  Post Acute Care Choice:    Choice offered to:     DME Arranged:    DME Agency:     HH Arranged:    HH Agency:     Status of Service:  In process, will continue to follow  If discussed at Long Length of Stay Meetings, dates discussed:    Additional Comments:  Reinaldo Raddle, RN, BSN  Trauma/Neuro ICU Case Manager [phone]

## 2016-07-16 NOTE — Progress Notes (Signed)
I am appreciative for IR and Surgical evaluations and intervention.  After the colonoscopy she complained of bilateral upper abdominal pain that radiated to the back.  Her colonoscopy was difficult and external pressure was required to achieve cecal intubation, but her procedure was not out of the norm for any other procedure.  I examined her after the procedure when she made her complaint, but her abdomen was nontender.  Unfortunately her splenic laceration occurred and she worsened overnight.  She is much better at this time and she is in good spirits.  Monique Perry has been very understanding of the situation.

## 2016-07-16 NOTE — Progress Notes (Signed)
Patient ID: Monique Perry, female   DOB: 1956/05/27, 60 y.o.   MRN: [id number]    Subjective: Abdominal pain better  Objective: Vital signs in last 24 hours: Temp:  [96.6 F (35.9 C)-97.6 F (36.4 C)] 97.6 F (36.4 C) (08/04 0400) Pulse Rate:  [72-101] 86 (08/04 0800) Resp:  [11-27] 27 (08/04 0800) BP: (71-154)/(44-127) 139/109 (08/04 0800) SpO2:  [91 %-100 %] 100 % (08/04 0800) Weight:  [65.8 kg (145 lb 1 oz)] 65.8 kg (145 lb 1 oz) (08/03 2345)    Intake/Output from previous day: 08/03 0701 - 08/04 0700 In: 2339.6 [I.V.:1339.6; IV Piggyback:1000] Out: 580 [Emesis/NG output:580] Intake/Output this shift: Total I/O In: 125 [I.V.:125] Out: -   General appearance: alert and cooperative Resp: clear to auscultation bilaterally Cardio: regular rate and rhythm GI: soft, +BS, Min TTP LUQ  Lab Results: CBC   Recent Labs  07/15/16 1905 07/15/16 1915 07/16/16 0012  WBC 6.8  --  5.3  HGB 9.2* 9.9* 5.8*  HCT 27.6* 29.0* 17.9*  PLT 133*  --  107*   BMET  Recent Labs  07/15/16 1905 07/15/16 1915  NA 138 137  K 5.3* 5.3*  CL 103 103  CO2 18*  --   GLUCOSE 218* 210*  BUN 14 12  CREATININE 1.02* 0.90  CALCIUM 8.0*  --    PT/INR  Recent Labs  07/15/16 2141 07/16/16 0012  LABPROT 14.1 16.4*  INR 1.09 1.31   ABG No results for input(s): PHART, HCO3 in the last 72 hours.  Invalid input(s): PCO2, PO2  Anti-infectives: Anti-infectives    None      Assessment/Plan: Grade 4 splenic laceration with active extravasation after colonoscopy - S/P angioembolization, appreciate IR intervention. HD stable now. Bedrest with BRP ABL anemia - CBC pending and will repeat this afternoon FEN - clears, PO pain meds VTE - PAS Dispo - ICU. She will need post-splenectomy vaccinations. Optimal timing is 2 weeks so will check if Dr. Kathyrn Lass at [facility], her primary care physician, is able to give these then. Otherwise, will plan to give prior to D/C.  LOS: 1 day    Georganna Skeans, MD, MPH, FACS Trauma: [phone] General Surgery: [phone]  07/16/2016

## 2016-07-16 NOTE — Progress Notes (Signed)
sbp remains in 80s. HR high 80s.  Hgb down to 6 on admission to [facility] Stat transfuse 2u prbc (blood bank says cross-matched blood avail within 10 min), keep 4u ahead.  Discussed with IR attending. Plan to proceed with urgent angio/embolization Apply warming blanket INR ok right now.   Leighton Ruff. Redmond Pulling, MD, FACS General, Bariatric, & Minimally Invasive Surgery [facility] Surgery, [place]

## 2016-07-16 NOTE — Sedation Documentation (Signed)
Vital signs stable. 

## 2016-07-17 LAB — BASIC METABOLIC PANEL
ANION GAP: 6 (ref 5–15)
BUN: 6 mg/dL (ref 6–20)
CALCIUM: 7.4 mg/dL — AB (ref 8.9–10.3)
CO2: 24 mmol/L (ref 22–32)
CREATININE: 0.58 mg/dL (ref 0.44–1.00)
Chloride: 104 mmol/L (ref 101–111)
GFR calc Af Amer: >60 mL/min (ref >60)
GLUCOSE: 86 mg/dL (ref 65–99)
Potassium: 3.6 mmol/L (ref 3.5–5.1)
Sodium: 134 mmol/L — ABNORMAL LOW (ref 135–145)

## 2016-07-17 LAB — CBC
HCT: 22.4 % — ABNORMAL LOW (ref 36.0–46.0)
Hemoglobin: 7.6 g/dL — ABNORMAL LOW (ref 12.0–15.0)
MCH: 33.3 pg (ref 26.0–34.0)
MCHC: 33.9 g/dL (ref 30.0–36.0)
MCV: 98.2 fL (ref 78.0–100.0)
PLATELETS: 82 10*3/uL — AB (ref 150–400)
RBC: 2.28 MIL/uL — ABNORMAL LOW (ref 3.87–5.11)
RDW: 18.9 % — AB (ref 11.5–15.5)
WBC: 7.8 10*3/uL (ref 4.0–10.5)

## 2016-07-17 LAB — HEMOGLOBIN AND HEMATOCRIT, BLOOD
HEMATOCRIT: 26.8 % — AB (ref 36.0–46.0)
Hemoglobin: 9.1 g/dL — ABNORMAL LOW (ref 12.0–15.0)

## 2016-07-17 LAB — PREPARE RBC (CROSSMATCH)

## 2016-07-17 MED ORDER — SODIUM CHLORIDE 0.9 % IV SOLN: Freq: Once | INTRAVENOUS | Status: AC

## 2016-07-17 NOTE — Progress Notes (Signed)
BP still a bit low and patient a bit more symptomatic.  Will give one unit of blood.  Monique Perry. Dahlia Bailiff, MD, [place] [phone] Trauma Surgeon

## 2016-07-17 NOTE — Progress Notes (Signed)
Trauma Service Note  Subjective: Pain in left upper quadrant, anxiety this morning  Objective: Vital signs in last 24 hours: Temp:  [97.7 F (36.5 C)-98.6 F (37 C)] 98.4 F (36.9 C) (08/05 0800) Pulse Rate:  [78-99] 95 (08/05 0717) Resp:  [11-23] 16 (08/05 0717) BP: (82-146)/(48-86) 116/55 (08/05 0717) SpO2:  [84 %-99 %] 97 % (08/05 0717)    Intake/Output from previous day: 08/04 0701 - 08/05 0700 In: 1761.3 [P.O.:450; I.V.:1311.3] Out: [id number] [Urine:1305] Intake/Output this shift: Total I/O In: 390 [P.O.:240; I.V.:150] Out: -   General: NAD  Lungs: CTAB  Abd: pain on palpation LUQ  Extremities: no edema  Neuro: AOx4  Lab Results: CBC   Recent Labs  07/16/16 1706 07/17/16 0600  WBC 8.4 7.8  HGB 8.3* 7.6*  HCT 24.5* 22.4*  PLT 85* 82*   BMET  Recent Labs  07/15/16 1905 07/15/16 1915 07/17/16 0600  NA 138 137 134*  K 5.3* 5.3* 3.6  CL 103 103 104  CO2 18*  --  24  GLUCOSE 218* 210* 86  BUN 14 12 6   CREATININE 1.02* 0.90 0.58  CALCIUM 8.0*  --  7.4*   PT/INR  Recent Labs  07/16/16 0012 07/16/16 2145  LABPROT 16.4* 12.9  INR 1.31 0.97   ABG No results for input(s): PHART, HCO3 in the last 72 hours.  Invalid input(s): PCO2, PO2  Studies/Results: No results found.  Anti-infectives: Anti-infectives    None      Medications Scheduled Meds: . acetaminophen  1,000 mg Oral TID  . calcium-vitamin D  1 tablet Oral BID WC  . cyanocobalamin  500 mcg Oral Daily  . gabapentin  600 mg Oral TID  . lip balm  1 application Topical BID  . QUEtiapine  400 mg Oral QHS   Continuous Infusions: . sodium chloride 0.9 % 1,000 mL infusion 50 mL/hr at 07/16/16 2213   PRN Meds:.alum & mag hydroxide-simeth, bisacodyl, cyclobenzaprine, diphenhydrAMINE, HYDROmorphone (DILAUDID) injection, lactated ringers, menthol-cetylpyridinium, methocarbamol (ROBAXIN)  IV, ondansetron (ZOFRAN) IV **OR** ondansetron (ZOFRAN) IV, ondansetron (ZOFRAN) IV, oxyCODONE,  phenol, prochlorperazine  Assessment/Plan: Splenic injury s/p embolization, today downtrending Hgb to 7.6. Overnight low reading BPs, this morning SBP in the 90s without presyncopal symptoms or tachycardia -continue ice chips -pain control -recheck labs in am   LOS: 2 days   [place] Surgeon [phone] Surgery 07/17/2016

## 2016-07-17 NOTE — Progress Notes (Signed)
Pt transferred to 3S11.

## 2016-07-18 LAB — CBC
HCT: 29.1 % — ABNORMAL LOW (ref 36.0–46.0)
Hemoglobin: 9.7 g/dL — ABNORMAL LOW (ref 12.0–15.0)
MCH: 32.6 pg (ref 26.0–34.0)
MCHC: 33.3 g/dL (ref 30.0–36.0)
MCV: 97.7 fL (ref 78.0–100.0)
PLATELETS: 81 10*3/uL — AB (ref 150–400)
RBC: 2.98 MIL/uL — AB (ref 3.87–5.11)
RDW: 18.8 % — AB (ref 11.5–15.5)
WBC: 7.9 10*3/uL (ref 4.0–10.5)

## 2016-07-18 NOTE — Progress Notes (Signed)
Trauma Service Note  Subjective: 1 unit blood yesterday, today pain on left side better, still hurts when moves, tolerating diet, no lightheadedness  Objective: Vital signs in last 24 hours: Temp:  [97.8 F (36.6 C)-100.9 F (38.3 C)] 98.1 F (36.7 C) (08/06 0757) Pulse Rate:  [85-131] 106 (08/06 0757) Resp:  [13-110] 107 (08/06 0757) BP: (101-154)/(56-112) 101/56 (08/06 0757) SpO2:  [68 %-98 %] 94 % (08/06 0757)    Intake/Output from previous day: 08/05 0701 - 08/06 0700 In: 1865 [P.O.:480; I.V.:1050; Blood:335] Out: 750 [Urine:750] Intake/Output this shift: No intake/output data recorded.  General: NAD  Lungs: CTAB  Abd: soft, slight tenderness LUQ  Extremities: no edema  Neuro: AOx4  Lab Results: CBC   Recent Labs  07/17/16 0600 07/17/16 1649 07/18/16 0420  WBC 7.8  --  7.9  HGB 7.6* 9.1* 9.7*  HCT 22.4* 26.8* 29.1*  PLT 82*  --  81*   BMET  Recent Labs  07/15/16 1905 07/15/16 1915 07/17/16 0600  NA 138 137 134*  K 5.3* 5.3* 3.6  CL 103 103 104  CO2 18*  --  24  GLUCOSE 218* 210* 86  BUN 14 12 6   CREATININE 1.02* 0.90 0.58  CALCIUM 8.0*  --  7.4*   PT/INR  Recent Labs  07/16/16 0012 07/16/16 2145  LABPROT 16.4* 12.9  INR 1.31 0.97   ABG No results for input(s): PHART, HCO3 in the last 72 hours.  Invalid input(s): PCO2, PO2  Studies/Results: No results found.  Anti-infectives: Anti-infectives    None      Medications Scheduled Meds: . acetaminophen  1,000 mg Oral TID  . calcium-vitamin D  1 tablet Oral BID WC  . cyanocobalamin  500 mcg Oral Daily  . gabapentin  600 mg Oral TID  . lip balm  1 application Topical BID  . QUEtiapine  400 mg Oral QHS   Continuous Infusions: . sodium chloride 0.9 % 1,000 mL infusion 50 mL/hr at 07/16/16 2213   PRN Meds:.alum & mag hydroxide-simeth, bisacodyl, cyclobenzaprine, diphenhydrAMINE, HYDROmorphone (DILAUDID) injection, menthol-cetylpyridinium, methocarbamol (ROBAXIN)  IV,  ondansetron (ZOFRAN) IV **OR** ondansetron (ZOFRAN) IV, ondansetron (ZOFRAN) IV, oxyCODONE, phenol, prochlorperazine  Assessment/Plan: 60 yo female with iatrogenic injury to spleen s/p embolization, received 1 unit of pRBC yesterday for symptomatic anemia with good response, hgb 9.7 today. -continue monitoring -continue reg diet -up to side of bed -recheck lab in am   LOS: 3 days   [place] Surgeon [phone] Surgery 07/18/2016

## 2016-07-19 LAB — CBC
HCT: 25.4 % — ABNORMAL LOW (ref 36.0–46.0)
HEMATOCRIT: 24.6 % — AB (ref 36.0–46.0)
HEMOGLOBIN: 8.3 g/dL — AB (ref 12.0–15.0)
Hemoglobin: 8.5 g/dL — ABNORMAL LOW (ref 12.0–15.0)
MCH: 33.2 pg (ref 26.0–34.0)
MCH: 33.1 pg (ref 26.0–34.0)
MCHC: 33.7 g/dL (ref 30.0–36.0)
MCHC: 33.5 g/dL (ref 30.0–36.0)
MCV: 98.4 fL (ref 78.0–100.0)
MCV: 98.8 fL (ref 78.0–100.0)
PLATELETS: 126 10*3/uL — AB (ref 150–400)
Platelets: 105 10*3/uL — ABNORMAL LOW (ref 150–400)
RBC: 2.5 MIL/uL — ABNORMAL LOW (ref 3.87–5.11)
RBC: 2.57 MIL/uL — AB (ref 3.87–5.11)
RDW: 17.9 % — ABNORMAL HIGH (ref 11.5–15.5)
RDW: 17.7 % — ABNORMAL HIGH (ref 11.5–15.5)
WBC: 6.6 10*3/uL (ref 4.0–10.5)
WBC: 6.7 10*3/uL (ref 4.0–10.5)

## 2016-07-19 NOTE — Progress Notes (Signed)
Hemoglobin up to 8.5 and platelets are up also.  Monique Perry. Dahlia Bailiff, MD, [place] [phone] [facility] Surgery

## 2016-07-19 NOTE — Progress Notes (Signed)
Pt arrived to 6N09 from 3S; pt alert and oriented; VSS: pt oriented to room; call bell w/i reach; no c/o pain; will cont. To monitor.  Ruben Reason

## 2016-07-19 NOTE — Care Management Important Message (Signed)
Important Message  Patient Details  Name: Monique Perry MRN: [id number] Date of Birth: Feb 28, 1956   Medicare Important Message Given:  Yes    Ahaana Rochette [place] 07/19/2016, 11:09 AM

## 2016-07-19 NOTE — Progress Notes (Signed)
Patient ID: Monique Perry, female   DOB: 09-Sep-1956, 60 y.o.   MRN: [id number]    Subjective: Feeling better, still some LUQ pain when getting up. Wants to go home soon.  Objective: Vital signs in last 24 hours: Temp:  [98.1 F (36.7 C)-99.6 F (37.6 C)] 98.1 F (36.7 C) (08/07 0757) Pulse Rate:  [90-112] 97 (08/07 0757) Resp:  [13-34] 24 (08/07 0757) BP: (95-120)/(48-69) 105/65 (08/07 0757) SpO2:  [90 %-96 %] 96 % (08/07 0757) Last BM Date: 07/18/16  Intake/Output from previous day: 08/06 0701 - 08/07 0700 In: 635 [P.O.:360; I.V.:275] Out: -  Intake/Output this shift: No intake/output data recorded.  General appearance: alert Resp: clear to auscultation bilaterally Cardio: regular rate and rhythm and HR 90 GI: soft, mild tenderness LUQ, +BS  Lab Results: CBC   Recent Labs  07/18/16 0420 07/19/16 0444  WBC 7.9 6.6  HGB 9.7* 8.3*  HCT 29.1* 24.6*  PLT 81* 105*   BMET  Recent Labs  07/17/16 0600  NA 134*  K 3.6  CL 104  CO2 24  GLUCOSE 86  BUN 6  CREATININE 0.58  CALCIUM 7.4*   PT/INR  Recent Labs  07/16/16 2145  LABPROT 12.9  INR 0.97   ABG No results for input(s): PHART, HCO3 in the last 72 hours.  Invalid input(s): PCO2, PO2  Studies/Results: No results found.  Anti-infectives: Anti-infectives    None      Assessment/Plan: Grade 4 splenic laceration with active extravasation after colonoscopy - S/P angioembolization, appreciate IR intervention. Ambulate with assist, PT/OT ABL anemia - Hb down but PLTs up and HR has been better. CBC this PM and in AM. FEN - no issue VTE - PAS Dispo - to floor, therapies. 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   LOS: 4 days    Georganna Skeans, MD, MPH, FACS Trauma: [phone] General Surgery: [phone]  07/19/2016

## 2016-07-19 NOTE — Progress Notes (Signed)
Report called to Roselyn Reef, Therapist, sports for 6 [employer] 09.

## 2016-07-20 LAB — TYPE AND SCREEN
ABO/RH(D): O POS
ANTIBODY SCREEN: NEGATIVE
UNIT DIVISION: 0
UNIT DIVISION: 0
UNIT DIVISION: 0
UNIT DIVISION: 0
Unit division: 0
Unit division: 0

## 2016-07-20 LAB — CBC
HCT: 25.8 % — ABNORMAL LOW (ref 36.0–46.0)
HEMOGLOBIN: 8.5 g/dL — AB (ref 12.0–15.0)
MCH: 33.1 pg (ref 26.0–34.0)
MCHC: 32.9 g/dL (ref 30.0–36.0)
MCV: 100.4 fL — ABNORMAL HIGH (ref 78.0–100.0)
PLATELETS: 139 10*3/uL — AB (ref 150–400)
RBC: 2.57 MIL/uL — AB (ref 3.87–5.11)
RDW: 17.6 % — ABNORMAL HIGH (ref 11.5–15.5)
WBC: 5.7 10*3/uL (ref 4.0–10.5)

## 2016-07-20 MED ORDER — PNEUMOCOCCAL VAC POLYVALENT 25 MCG/0.5ML IJ INJ
0.5000 mL | INJECTION | Freq: Once | INTRAMUSCULAR | Status: AC
  Administered 2016-07-20: 0.5 mL via INTRAMUSCULAR
  Filled 2016-07-20: qty 0.5

## 2016-07-20 MED ORDER — PNEUMOCOCCAL VAC POLYVALENT 25 MCG/0.5ML IJ INJ: 0.5000 mL | INJECTION | INTRAMUSCULAR | Status: DC

## 2016-07-20 MED ORDER — MENINGOCOCCAL VAC A,C,Y,W-135 ~~LOC~~ INJ
0.5000 mL | INJECTION | Freq: Once | SUBCUTANEOUS | Status: AC
  Administered 2016-07-20: 0.5 mL via SUBCUTANEOUS
  Filled 2016-07-20: qty 0.5

## 2016-07-20 MED ORDER — HAEMOPHILUS B POLYSAC CONJ VAC IM SOLR
0.5000 mL | Freq: Once | INTRAMUSCULAR | Status: AC
  Administered 2016-07-20: 0.5 mL via INTRAMUSCULAR
  Filled 2016-07-20: qty 0.5

## 2016-07-20 MED ORDER — OXYCODONE HCL 5 MG PO TABS: 5.0000 mg | ORAL_TABLET | Freq: Four times a day (QID) | ORAL | 0 refills | Status: DC | PRN

## 2016-07-20 NOTE — Evaluation (Signed)
Physical Therapy Evaluation Patient Details Name: Monique Perry MRN: [id number] DOB: [birth year] Today's Date: 07/20/2016   History of Present Illness  Pt is a 60 yo female who had a colonoscopy on 07/15/2016 and had intense pain afterwards and it continued to worsen once home. She passed out hitting her head on a door knob she also had a laceration on the back of her head. Pt found to have a splenic rupture. 07/16/2016 Splenic artery embolization. PHMx: ETOH abuse until last Feb, Bipolar disorder,  Clinical Impression  Pt is mobilizing better this session than even with OT in prior session earlier this morning.  I do believe she just needs to get up and mobilize more to return to baseline.  No DOE with gait on RA and O2 sats stayed in the mid 90s as read by forehead and earlobe (she has red polish on her hands and feet).  PT will follow acutely, but I do not anticipate any needs at discharge.      Follow Up Recommendations No PT follow up    Equipment Recommendations  None recommended by PT    Recommendations for Other Services   NA    Precautions / Restrictions Precautions Precautions: Fall Precaution Comments: mildly unsteady on her feet Restrictions Weight Bearing Restrictions: No      Mobility  Bed Mobility Overal bed mobility: Modified Independent             General bed mobility comments: HOB elevated, got to EOB easily  Transfers Overall transfer level: Modified independent Equipment used: None             General transfer comment: uses hands for transitions.   Ambulation/Gait Ambulation/Gait assistance: Modified independent (Device/Increase time) Ambulation Distance (Feet): 515 Feet Assistive device: None (pushing dynamap) Gait Pattern/deviations: Step-through pattern;Staggering right;Staggering left     General Gait Details: mild staggering gait pattern         Balance Overall balance assessment: Needs assistance Sitting-balance support: Feet  supported;No upper extremity supported Sitting balance-Leahy Scale: Good     Standing balance support: No upper extremity supported;Bilateral upper extremity supported;Single extremity supported Standing balance-Leahy Scale: Fair                               Pertinent Vitals/Pain Pain Assessment: 0-10 Pain Score: 6  Pain Location: left upper quad, especially with deep inspiration.  Pain Descriptors / Indicators: Sharp Pain Intervention(s): Limited activity within patient's tolerance;Monitored during session;Repositioned    Home Living Family/patient expects to be discharged to:: Private residence Living Arrangements: Parent (she is their caregivers) Available Help at Discharge: Family;Available 24 hours/day Type of Home: House Home Access: Stairs to enter Entrance Stairs-Rails: None Entrance Stairs-Number of Steps: 2 Home Layout: One level Home Equipment: Walker - 2 wheels;Cane - single point;Toilet riser;Grab bars - tub/shower      Prior Function Level of Independence: Independent         Comments: Primary caregiver for her parents (dad needs physical A, mother does not)     Hand Dominance   Dominant Hand: Right    Extremity/Trunk Assessment   Upper Extremity Assessment: Defer to OT evaluation           Lower Extremity Assessment: Generalized weakness      Cervical / Trunk Assessment: Normal  Communication   Communication: No difficulties  Cognition Arousal/Alertness: Awake/alert Behavior During Therapy: WFL for tasks assessed/performed Overall Cognitive Status: Within Functional Limits for tasks  assessed                      General Comments General comments (skin integrity, edema, etc.): Pt's O2 sat measured on both her forehead and earlobe and seemed mid 90s throughout gait.  We were unable to get pulse ox on her fingers/toes due to red polish.      Exercises Other Exercises Other Exercises: ISx 5 reps, cues for technique,  max inspired 1500 mL      Assessment/Plan    PT Assessment Patient needs continued PT services  PT Diagnosis Difficulty walking;Abnormality of gait;Generalized weakness   PT Problem List Decreased strength;Decreased activity tolerance;Decreased balance;Decreased mobility;Pain  PT Treatment Interventions Gait training;Stair training;Functional mobility training;Therapeutic activities;Therapeutic exercise;Balance training   PT Goals (Current goals can be found in the Care Plan section) Acute Rehab PT Goals Patient Stated Goal: to go home later today PT Goal Formulation: With patient Time For Goal Achievement: 08/03/16 Potential to Achieve Goals: Good    Frequency Min 3X/week    End of Session   Activity Tolerance: Patient limited by fatigue Patient left: in bed;with call bell/phone within reach           Time: 1122-1142 PT Time Calculation (min) (ACUTE ONLY): 20 min   Charges:   PT Evaluation $PT Eval Moderate Complexity: 1 Procedure          Mikeisha Lemonds B. Dillon Mcreynolds, PT, DPT [phone]   07/20/2016, 11:51 AM

## 2016-07-20 NOTE — Progress Notes (Addendum)
Patient ID: Monique Perry, female   DOB: September 21, 1956, 60 y.o.   MRN: [id number]  [facility] Surgery Progress Note     Subjective: Up in bed this a.m. Reports feeling somewhat better. Some continued LUQ pain, but it is tolerable. Last BM PTA but does have flatus. Minimal appetite but reports this is her baseline. Denies abdominal pain, nausea, or vomiting. Worked with OT this morning and deemed safe to go home and manage her own basic ADLs. Hopes to go home today. Cares for her elderly parents, feels that she is ready for this responsibility.  Objective: Vital signs in last 24 hours: Temp:  [97.8 F (36.6 C)-99.3 F (37.4 C)] 98.2 F (36.8 C) (08/08 0928) Pulse Rate:  [66-98] 86 (08/08 0928) Resp:  [18] 18 (08/08 0928) BP: (113-136)/(60-81) 118/74 (08/08 0928) SpO2:  [85 %-95 %] 93 % (08/08 0928) Weight:  [322 lb 1.5 oz (146.1 kg)] 322 lb 1.5 oz (146.1 kg) (08/07 1857) Last BM Date:  (PTA)  Intake/Output from previous day: 08/07 0701 - 08/08 0700 In: 360 [P.O.:360] Out: 1450 [Urine:1450] Intake/Output this shift: Total I/O In: 480 [P.O.:480] Out: 500 [Urine:500]  PE: Gen:  Alert, NAD, pleasant Card:  RRR, HR 86 Pulm:  CTA, no W/R/R Abd: Soft, ND, +BS. Mild TTP LUQ  Lab Results:   Recent Labs  07/19/16 1656 07/20/16 0527  WBC 6.7 5.7  HGB 8.5* 8.5*  HCT 25.4* 25.8*  PLT 126* 139*   BMET No results for input(s): NA, K, CL, CO2, GLUCOSE, BUN, CREATININE, CALCIUM in the last 72 hours. PT/INR No results for input(s): LABPROT, INR in the last 72 hours. CMP     Component Value Date/Time   NA 134 (L) 07/17/2016 0600   K 3.6 07/17/2016 0600   CL 104 07/17/2016 0600   CO2 24 07/17/2016 0600   GLUCOSE 86 07/17/2016 0600   BUN 6 07/17/2016 0600   CREATININE 0.58 07/17/2016 0600   CREATININE 0.64 11/12/2013 1406   CALCIUM 7.4 (L) 07/17/2016 0600   PROT 5.5 (L) 07/15/2016 1905   ALBUMIN 3.3 (L) 07/15/2016 1905   AST 49 (H) 07/15/2016 1905   ALT 24  07/15/2016 1905   ALKPHOS 57 07/15/2016 1905   BILITOT 1.2 07/15/2016 1905   GFRNONAA >60 07/17/2016 0600   GFRNONAA >89 11/12/2013 1406   GFRAA >60 07/17/2016 0600   GFRAA >89 11/12/2013 1406   Lipase     Component Value Date/Time   LIPASE 19 07/21/2012 0613       Studies/Results: No results found.  Anti-infectives: Anti-infectives    None       Assessment/Plan Grade 4 splenic laceration with active extravasation after colonoscopy - S/P angioembolization, appreciate IR intervention. Saw OT this a.m. And cleared safe to go home and manage her own basic ADLs. ABL anemia - Hb stable 8.5.  FEN - no issue VTE - PAS Dispo - therapies, saw OT not Pt. She lives with her parents and is their care giver, but feels safe to manage own ADL's as well as assist parents safely. Hope to go home late today or early tomorrow.   LOS: 5 days    Jerrye Beavers , [facility] Surgery 07/20/2016, 10:46 AM Pager: [phone] Consults: [phone] Mon-Fri 7:00 am-4:30 pm Sat-Sun 7:00 am-11:30 am  Cleared by PT. D/C home. Patient examined and I agree with the assessment and plan  Georganna Skeans, MD, MPH, FACS Trauma: [phone] General Surgery: [phone]  07/20/2016 2:55 PM.

## 2016-07-20 NOTE — Progress Notes (Signed)
Pt ready for discharge to home. Pt. Is alert and oriented. Pt is hemodynamically stable. IV removed. AVS reviewed with pt. Capable of re verbalizing medication regimen. Discharge plan appropriate and in place.

## 2016-07-20 NOTE — Evaluation (Signed)
Occupational Therapy Evaluation Patient Details Name: Monique Perry MRN: [id number] DOB: 07-08-1956 Today's Date: 07/20/2016    History of Present Illness Pt is a 60 yo female who had a colonoscopy on 07/15/2016 and had intense pain afterwards and it continued to worsen once home. She passed out hitting her head on a door knob she also had a laceration on the back of her head. Pt found to have a splenic rupture. 07/16/2016 Splenic artery embolization. PHMx: ETOH abuse until last Feb, Bipolar disorder,   Clinical Impression   This 60 yo female admitted and underwent above presents to acute OT at a level that I feel she is safe to go home and manage her own basic ADLs, but question how much she should be assisting her parents at this time (MD should let her know if she has any restrictions). Of note pt dropped her sats to 95% while ambulating without O2 and  up to 94% on 3 liters an intially purse lip breathing--pt left on 3 liters at end of session. Acute OT will sign off.    Follow Up Recommendations  No OT follow up    Equipment Recommendations  None recommended by OT       Precautions / Restrictions Precautions Precautions: Fall Restrictions Weight Bearing Restrictions: No      Mobility Bed Mobility Overal bed mobility: Modified Independent             General bed mobility comments: increased time and HOB up  Transfers Overall transfer level: Independent Equipment used: None             General transfer comment: Ambulation pt was minguard A with around 1/3 of unit--ocassional sway and cross stepping (was able to keep her balance with these issues)    Balance Overall balance assessment: Needs assistance Sitting-balance support: No upper extremity supported;Feet supported Sitting balance-Leahy Scale: Good     Standing balance support: No upper extremity supported Standing balance-Leahy Scale: Fair                              ADL                                          General ADL Comments: Min guard at present when up on her feet for longer distances, but suspect as she becomes more mobile (since mainly been in bed since admission) that her balance will be better. She reports that she sometimes needs to physically A her dad for basic ADLs and sit<>stand ( I told her that I would guess that her MD may not want her straining at present time--but could check with them). Pt reports that she has a shower seat she can sit on--I advised her that she probably would want to use this at least intially due to this would take less energy than standing and espically since her sats are dropping with activity.               Pertinent Vitals/Pain Pain Assessment: 0-10 Pain Score: 6  Pain Location: abominal--left upper quadrant Pain Descriptors / Indicators: Nagging;Sore (sharp at times) Pain Intervention(s): Limited activity within patient's tolerance;Monitored during session;Premedicated before session     Hand Dominance Right   Extremity/Trunk Assessment Upper Extremity Assessment Upper Extremity Assessment: Overall WFL for tasks assessed   Lower Extremity Assessment  Lower Extremity Assessment: Defer to PT evaluation       Communication Communication Communication: No difficulties   Cognition Arousal/Alertness: Awake/alert Behavior During Therapy: WFL for tasks assessed/performed Overall Cognitive Status: Within Functional Limits for tasks assessed                                Home Living Family/patient expects to be discharged to:: Private residence Living Arrangements: Parent (she is their caregivers) Available Help at Discharge: Family;Available 24 hours/day Type of Home: House Home Access: Stairs to enter [employer] of Steps: 2 Entrance Stairs-Rails: None Home Layout: One level     Bathroom Shower/Tub: Walk-in shower;Curtain   Bathroom Toilet: Handicapped height     Home  Equipment: Environmental consultant - 2 wheels;Cane - single point;Toilet riser;Grab bars - tub/shower          Prior Functioning/Environment Level of Independence: Independent        Comments: Primary caregiver for her parents (dad needs physical A, mother does not)    OT Diagnosis: Generalized weakness   OT Problem List: Impaired balance (sitting and/or standing)   OT Treatment/Interventions:      OT Goals(Current goals can be found in the care plan section) Acute Rehab OT Goals Patient Stated Goal: home soon  OT Frequency:                End of Session Equipment Utilized During Treatment: Gait belt Nurse Communication: Mobility status (O2 status on RA and back on 3liters; no chair alarm due to no bed alarm on when I entered and pt reports that staff have been letting her get up to bathroom by herself)  Activity Tolerance: Patient tolerated treatment well Patient left: in chair;with call bell/phone within reach   Time: 0841-0901 OT Time Calculation (min): 20 min Charges:  OT General Charges $OT Visit: 1 Procedure OT Evaluation $OT Eval Moderate Complexity: 1 Procedure  Almon Register [id number] 07/20/2016, 9:34 AM

## 2016-07-20 NOTE — Discharge Summary (Signed)
[facility] Discharge Summary   Patient ID: Monique Perry MRN: [id number] DOB/AGE: May 22, 1956 60 y.o.  Admit date: 07/15/2016 Discharge date: 07/20/2016  Admitting Diagnosis: Loss of consciousness Abdominal pain Hypotension  Discharge Diagnosis Patient Active Problem List   Diagnosis Date Noted  . Splenic rupture 07/15/2016  . Serrated adenoma of cecum s/p polypectomy 2011 07/15/2016  . Neck pain on left side 11/02/2014  . Elevated transaminase level 02/14/2014  . Anemia 11/14/2013  . History of alcoholism ([place]) 11/12/2013  . Hepatitis B vaccination administered at current visit 11/12/2013  . Knee pain 06/18/2013  . Right foot pain s/p fusion surgery of right foot 05/10/2013  . Fracture of metatarsal of right foot, closed 03/20/2013  . Abnormality of gait 03/20/2013  . Low back pain radiating to right leg 02/20/2013  . Chronic back pain 02/08/2013  . Atherosclerotic peripheral vascular disease with intermittent claudication ([place]) 02/08/2013  . Carotid bruit 02/08/2013  . High blood pressure 02/08/2013  . LSIL (low grade squamous intraepithelial lesion) on Pap smear 12/14/2012  . Osteopenia 10/02/2012  . Hyperlipidemia 08/31/2012  . Depression 08/31/2012  . left nose skin lesion 08/31/2012  . Drug abuse, history of   . Bipolar disorder ([place])   . Collagenous colitis   . Medically noncompliant 07/26/2012  . GERD (gastroesophageal reflux disease) 07/21/2012  . Tobacco abuse 07/21/2012    Consultants None  Imaging: CT Abdomen Pelvis w Contrast 07/15/16: IMPRESSION: 1. Splenic Rupture with Active Extravasation. Preliminary report of this critical finding discussed by telephone with Dr. Threasa Beards BELFI on 07/15/2016 at 2054 hours. 2. Moderate volume pneumoperitoneum in the abdomen and pelvis. 3. No pneumoperitoneum or evidence of bowel perforation identified. 4. No other acute finding in the abdomen or pelvis. Calcified  aortic atherosclerosis.  Procedures Dr. Vernard Gambles (07/16/16) - Splenic artery embolization   Hospital Course:  Monique Perry is a 60yo female with a history of bipolar disorder and prior remote drug and alcohol abuse, who presented to [facility] with lower abdominal pain. She reported having a colonoscopy by Dr. Carol Ada earlier that day on 07/15/16 and was told that they removed 2-3 polyps. As the day went on her pain worsened. That evening she reported feeling dizzy/lightheaded, and at one point she had a syncopal episode while walking and struck her head on the door. Ambulance was called. On arrival in ED CT scan done and showed an obvious splenic rupture with hemoperitoneum and extravasation into a splenic capsule without free extravasation; surgical consultation requested. Patient required 2u PRBC 07/16/16. Patient was admitted and underwent procedure listed above.  Tolerated procedure well and was transferred to the floor.  She did require additional 1u PRBC on 07/17/16, but after that she remained hemodynamically stable. She did continue to complain of left upper quadrant pain, but it was gradually improving. On admission day 5 the patient was voiding well, tolerating diet, ambulating well, pain well controlled, vital signs stable, and felt stable for discharge home. Monique Perry does live with and care for her elderly parents, but she feel safe managing her own ADL's as well as assisting parents safely.  Physical Exam: Gen:  Alert, NAD, pleasant Card:  RRR, HR 86 Pulm:  CTA, no W/R/R Abd: Soft, ND, +BS. Mild TTP LUQ     Medication List    STOP taking these medications   cyclobenzaprine 10 MG tablet Commonly known as:  FLEXERIL   traMADol 50 MG tablet Commonly known as:  ULTRAM     TAKE these medications  calcium citrate-vitamin D 315-200 MG-UNIT tablet Commonly known as:  CITRACAL+D Take 1 tablet by mouth 2 (two) times daily. She takes Calcium Vitamin D 600-800 1 tab BID   CHANTIX STARTING  MONTH PAK 0.5 MG X 11 & 1 MG X 42 tablet Generic drug:  varenicline TAKE AS DIRECTED   gabapentin 300 MG capsule Commonly known as:  NEURONTIN TAKE 2 CAPSULES BY MOUTH 3 TIMES A DAY   oxyCODONE 5 MG immediate release tablet Commonly known as:  Oxy IR/ROXICODONE Take 1-2 tablets (5-10 mg total) by mouth every 6 (six) hours as needed for moderate pain or severe pain.   QUEtiapine 400 MG 24 hr tablet Commonly known as:  SEROQUEL XR Take 400 mg by mouth at bedtime.   vitamin B-12 500 MCG tablet Commonly known as:  CYANOCOBALAMIN Take 500 mcg by mouth daily.      Patient needs post-splenectomy vaccines (Pneumococcal, meningococcal, and Haemophilus influenzae (hib)) within 1 week. Will plan injections prior to discharge, or can contact PCP for injections. Otherwise follow-up PRN.   Signed: Jerrye Beavers, [facility] 07/20/2016, 3:59 PM Pager: [phone] Consults: [phone] Mon-Fri 7:00 am-4:30 pm Sat-Sun 7:00 am-11:30 am

## 2016-07-26 ENCOUNTER — Telehealth (HOSPITAL_COMMUNITY): Payer: Self-pay

## 2016-07-26 ENCOUNTER — Other Ambulatory Visit (INDEPENDENT_AMBULATORY_CARE_PROVIDER_SITE_OTHER): Payer: Self-pay | Admitting: Physician Assistant

## 2016-07-26 MED ORDER — OXYCODONE HCL 5 MG PO TABS: 5.0000 mg | ORAL_TABLET | Freq: Three times a day (TID) | ORAL | 0 refills | Status: DC | PRN

## 2016-07-26 MED ORDER — OXYCODONE HCL 5 MG PO TABS
5.0000 mg | ORAL_TABLET | Freq: Three times a day (TID) | ORAL | 0 refills | Status: DC | PRN
Start: 2016-07-26 — End: 2016-07-26

## 2016-07-26 NOTE — Telephone Encounter (Signed)
Returned phone call to patient. States that she has weaned down to 1 tablet oxycodone 3-4x daily. Continues to have some pain. Requesting refill. Will provide refill, but advised to continue weaning down and off this medication.  Having trouble printing rx, will try again tomorrow.

## 2016-07-27 NOTE — Telephone Encounter (Signed)
Micheal Jeffries wrote rx and placed in trauma office for patient to pick up.

## 2016-08-03 DIAGNOSIS — S3609XA Other injury of spleen, initial encounter: Secondary | ICD-10-CM | POA: Diagnosis not present

## 2016-09-06 DIAGNOSIS — D649 Anemia, unspecified: Secondary | ICD-10-CM | POA: Diagnosis not present

## 2016-09-06 DIAGNOSIS — Z9081 Acquired absence of spleen: Secondary | ICD-10-CM | POA: Diagnosis not present

## 2016-09-06 DIAGNOSIS — Z23 Encounter for immunization: Secondary | ICD-10-CM | POA: Diagnosis not present

## 2016-09-06 DIAGNOSIS — R5383 Other fatigue: Secondary | ICD-10-CM | POA: Diagnosis not present

## 2016-09-09 DIAGNOSIS — R899 Unspecified abnormal finding in specimens from other organs, systems and tissues: Secondary | ICD-10-CM | POA: Diagnosis not present

## 2016-09-17 DIAGNOSIS — M542 Cervicalgia: Secondary | ICD-10-CM | POA: Diagnosis not present

## 2016-09-20 DIAGNOSIS — M542 Cervicalgia: Secondary | ICD-10-CM | POA: Diagnosis not present

## 2016-09-22 DIAGNOSIS — M542 Cervicalgia: Secondary | ICD-10-CM | POA: Diagnosis not present

## 2016-09-28 DIAGNOSIS — M542 Cervicalgia: Secondary | ICD-10-CM | POA: Diagnosis not present

## 2016-09-30 DIAGNOSIS — M542 Cervicalgia: Secondary | ICD-10-CM | POA: Diagnosis not present

## 2016-10-04 DIAGNOSIS — M542 Cervicalgia: Secondary | ICD-10-CM | POA: Diagnosis not present

## 2016-10-08 DIAGNOSIS — M542 Cervicalgia: Secondary | ICD-10-CM | POA: Diagnosis not present

## 2016-10-11 DIAGNOSIS — M542 Cervicalgia: Secondary | ICD-10-CM | POA: Diagnosis not present

## 2016-10-15 DIAGNOSIS — M542 Cervicalgia: Secondary | ICD-10-CM | POA: Diagnosis not present

## 2016-10-18 DIAGNOSIS — M542 Cervicalgia: Secondary | ICD-10-CM | POA: Diagnosis not present

## 2016-10-18 DIAGNOSIS — Z23 Encounter for immunization: Secondary | ICD-10-CM | POA: Diagnosis not present

## 2016-10-18 DIAGNOSIS — Z9081 Acquired absence of spleen: Secondary | ICD-10-CM | POA: Diagnosis not present

## 2016-10-22 DIAGNOSIS — M542 Cervicalgia: Secondary | ICD-10-CM | POA: Diagnosis not present

## 2016-10-28 ENCOUNTER — Other Ambulatory Visit: Payer: Self-pay | Admitting: Family Medicine

## 2016-10-28 DIAGNOSIS — Z1231 Encounter for screening mammogram for malignant neoplasm of breast: Secondary | ICD-10-CM

## 2016-11-16 DIAGNOSIS — L820 Inflamed seborrheic keratosis: Secondary | ICD-10-CM | POA: Diagnosis not present

## 2016-11-16 DIAGNOSIS — L720 Epidermal cyst: Secondary | ICD-10-CM | POA: Diagnosis not present

## 2016-11-16 DIAGNOSIS — L98411 Non-pressure chronic ulcer of buttock limited to breakdown of skin: Secondary | ICD-10-CM | POA: Diagnosis not present

## 2016-11-16 DIAGNOSIS — L570 Actinic keratosis: Secondary | ICD-10-CM | POA: Diagnosis not present

## 2016-12-03 ENCOUNTER — Ambulatory Visit: Payer: Medicare Other

## 2016-12-08 DIAGNOSIS — S93492A Sprain of other ligament of left ankle, initial encounter: Secondary | ICD-10-CM | POA: Diagnosis not present

## 2016-12-08 DIAGNOSIS — F321 Major depressive disorder, single episode, moderate: Secondary | ICD-10-CM | POA: Diagnosis not present

## 2016-12-08 DIAGNOSIS — S8002XA Contusion of left knee, initial encounter: Secondary | ICD-10-CM | POA: Diagnosis not present

## 2016-12-28 ENCOUNTER — Ambulatory Visit
Admission: RE | Admit: 2016-12-28 | Discharge: 2016-12-28 | Disposition: A | Discharge status: Home / Self Care | Payer: Medicare Other | Source: Ambulatory Visit | Attending: Family Medicine | Admitting: Family Medicine

## 2016-12-28 DIAGNOSIS — Z1231 Encounter for screening mammogram for malignant neoplasm of breast: Secondary | ICD-10-CM | POA: Diagnosis not present

## 2017-01-06 ENCOUNTER — Ambulatory Visit (INDEPENDENT_AMBULATORY_CARE_PROVIDER_SITE_OTHER): Payer: Medicare Other | Admitting: Internal Medicine

## 2017-01-06 LAB — VITAMIN D 25 HYDROXY (VIT D DEFICIENCY, FRACTURES): VITD: 40.19 ng/mL (ref 30.00–100.00)

## 2017-01-06 NOTE — Patient Instructions (Addendum)
Please stop at the lab.  Please stop the calcium and vitamin D. Continue the multivitamin.  Reduce the amount of calcium from diet.  We will schedule a new appt if the results are abnormal.  Hypercalcemia Introduction Hypercalcemia is having too much calcium in the blood. The body needs calcium to make bones and keep them strong. Calcium also helps the muscles, nerves, brain, and heart work the way they should. Most of the calcium in the body is in the bones. There is also some calcium in the blood. Hypercalcemia can happen when calcium comes out of the bones, or when the kidneys are not able to remove calcium from the blood. Hypercalcemia can be mild or severe. What are the causes? There are many possible causes of hypercalcemia. Common causes include:  Hyperparathyroidism. This is a condition in which the body produces too much parathyroid hormone. There are four parathyroid glands in your neck. These glands produce a chemical messenger (hormone) that helps the body absorb calcium from foods and helps your bones release calcium.  Certain kinds of cancer, such as lung cancer, breast cancer, or myeloma. Less common causes of hypercalcemia include:  Getting too much calcium or vitamin D from your diet.  Kidney failure.  Hyperthyroidism.  Being on bed rest for a long time.  Certain medicines.  Infections.  Sarcoidosis. What increases the risk? This condition is more likely to develop in:  Women.  People who are 60 years or older.  People who have a family history of hypercalcemia. What are the signs or symptoms? Mild hypercalcemia that starts slowly may not cause symptoms. Severe, sudden hypercalcemia is more likely to cause symptoms, such as:  Loss of appetite.  Increased thirst and frequent urination.  Fatigue.  Nausea and vomiting.  Headache.  Abdominal pain.  Muscle pain, twitching, or weakness.  Constipation.  Blood in the urine.  Pain in the side of  the back (flank pain).  Anxiety, confusion, or depression.  Irregular heartbeat (arrhythmia).  Loss of consciousness. How is this diagnosed? This condition may be diagnosed based on:  Your symptoms.  Blood tests.  Urine tests.  X-rays.  Ultrasound.  MRI.  CT scan. How is this treated? Treatment for hypercalcemia depends on the cause. Treatment may include:  Receiving fluids through an IV tube.  Medicines that keep calcium levels steady after receiving fluids (loop diuretics).  Medicines that keep calcium in your bones (bisphosphonates).  Medicines that lower the calcium level in your blood.  Surgery to remove overactive parathyroid glands. Follow these instructions at home:  Take over-the-counter and prescription medicines only as told by your health care provider.  Follow instructions from your health care provider about eating or drinking restrictions.  Drink enough fluid to keep your urine clear or pale yellow.  Stay active. Weight-bearing exercise helps to keep calcium in your bones. Follow instructions from your health care provider about what type and level of exercise is safe for you.  Keep all follow-up visits as told by your health care provider. This is important. Contact a health care provider if:  You have a fever.  You have flank or abdominal pain that is getting worse. Get help right away if:  You have severe abdominal or flank pain.  You have chest pain.  You have trouble breathing.  You become very confused and sleepy.  You lose consciousness. This information is not intended to replace advice given to you by your health care provider. Make sure you discuss any questions  you have with your health care provider. Document Released: 02/12/2005 Document Revised: 05/06/2016 Document Reviewed: 04/16/2015  2017 Elsevier

## 2017-01-06 NOTE — Progress Notes (Signed)
Patient ID: Monique Perry, female   DOB: Jan 05, 1956, 61 y.o.   MRN: [id number]    HPI  Monique Perry is a 61 y.o.-year-old female, referred by her PCP, Dr. Kathyrn Lass, for evaluation for hypercalcemia.  Pt was dx with hypercalcemia in 08/2016. The condition appeared suddenly, after having normal calcium levels and then even hypocalcemia in 07/2016 when she was admitted for splenic rupture post colonoscopy.   I reviewed pt's pertinent labs for Epic records and also from Care everywhere: 10/18/2016: Calcium 13.6, TSH 1.95 09/21/2016: 1, 25 dihydroxy vitamin D 8 (19-79), PTHrp <1.1 09/09/2016: Corrected Calcium 13.3, ionized calcium 7.3 (4.5-5.6), intact PTH 14 (15-63) Lab Results  Component Value Date   CALCIUM 7.4 (L) 07/17/2016   CALCIUM 8.0 (L) 07/15/2016   CALCIUM 9.7 11/12/2013   CALCIUM 10.3 05/10/2013   CALCIUM 9.2 07/26/2012   CALCIUM 8.9 07/23/2012   CALCIUM 8.8 07/22/2012   CALCIUM 8.8 07/22/2012   CALCIUM 8.4 07/21/2012   CALCIUM 8.4 07/21/2012   Of note, SPEP was recently negative, UPEP did not show an M spike, and IFE was normal.  In 2014, a DEXA scan showed osteopenia. No fractures or falls.   No h/o kidney stones.  + h/o abnormal BUN/Cr at last check: 10/18/2016: BUN/creatinine 31/1.27, eGFR 43/52, CO2 36 (22-32), Glu 93 09/06/2016: BUN/creatinine 10/0.75, GFR 79/95 Lab Results  Component Value Date   BUN 6 07/17/2016   BUN 12 07/15/2016   CREATININE 0.58 07/17/2016   CREATININE 0.90 07/15/2016   Pt is not on HCTZ.   She is still on: - calcium 1200 mg in am (!) - for last 4 years - vitamin D 400 units (along with calcium) + MVI.  She drinks milk - 10 oz a day, eats cheese, no green leafy veggies.  No h/o vitamin D deficiency. Reviewed vit D levels: No results found for: VD25OH  Pt does not have a FH of hypercalcemia.   She smokes at most 5 cigs a day. She has been smoking for 40 years. Max in the past 1/2 PPD.  She has a 20 lbs weight loss  since 07/2016. No energy. Her father just died 12/07/16.   ROS: Constitutional: + weight loss, + fatigue, no subjective hyperthermia/hypothermia, + hot flushes Eyes: + blurry vision, no xerophthalmia ENT: no sore throat, no nodules palpated in throat, + dysphagia/no odynophagia, + hoarseness Cardiovascular: no CP/+ SOB/no palpitations/leg swelling Respiratory: no cough/+ SOB Gastrointestinal: no N/V/+ D/no C Musculoskeletal: + both: muscle/joint aches Skin: no rashes, + easy bruising Neurological: no tremors/numbness/tingling/dizziness Psychiatric: no depression/anxiety  Past Medical History:  Diagnosis Date  . Alcohol abuse last February 2012  . Arthritis   . Bipolar disorder ([place])   . Collagenous colitis   . Drug abuse last 1990's   Past Surgical History:  Procedure Laterality Date  . ABDOMINAL HYSTERECTOMY  Age 61 years ago   partial   . FOOT SURGERY    . IR GENERIC HISTORICAL  07/16/2016   IR US GUIDE VASC ACCESS RIGHT 07/16/2016 Arne Cleveland, MD [facility]  . IR GENERIC HISTORICAL  07/16/2016   IR ANGIOGRAM VISCERAL SELECTIVE 07/16/2016 Arne Cleveland, MD [facility]  . IR GENERIC HISTORICAL  07/16/2016   IR ANGIOGRAM SELECTIVE EACH ADDITIONAL VESSEL 07/16/2016 Arne Cleveland, MD [facility]  . IR GENERIC HISTORICAL  07/16/2016   IR EMBO ART  VEN HEMORR LYMPH EXTRAV  INC GUIDE ROADMAPPING 07/16/2016 Arne Cleveland, MD [facility]  . IR GENERIC HISTORICAL  07/16/2016  IR ANGIOGRAM FOLLOW UP STUDY 07/16/2016 Arne Cleveland, MD [facility]  . [place] fusion Right 04/17/2012  . NOSE SURGERY    . OSTEOTOMY Right 04/17/2012   Rt #2 Metatarsal  . TONSILLECTOMY  As a child   . WRIST SURGERY    She also had vocal cord polyps removed 02/2016, splenectomy 07/2006, left knee surgery 08/2016,.  Social History   Social History  . Marital status: Single    Spouse name: N/A  . Number of children: N/A  . Years of education: N/A   Occupational History  . Not on file.   Social History  Main Topics  . Smoking status: Current Some Day Smoker    Packs/day: 0.50    Types: Cigarettes, Cigars  . Smokeless tobacco: Never Used     Comment: smoking cig for 35 years. continues to smoke 1/2 pack daily  . Alcohol use 0.0 oz/week     Comment: ETOH abuse for 25 year, 1/2 gallon Liquor daily. admits 6 weeks course of drinking ETOH as her only intake in 2011. she states that she quits her drinnking since Feb 2012.  . Drug use: No     Comment: pots before, clean since 1995  . Sexual activity: No   Other Topics Concern  . Not on file   Social History Narrative   Lives with mom (caregiver) in [place]. 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 Did not complete High School .       She admits only 2-3 hours daily for 15 years. She does not sleep at nighttime. She would watch TV and read at night time. Always sleep during the day. She has seen a psychiatrist at [facility] and still goes to the group therapy on Mondays. She has an follow up appt with her Psychiatrist on 08/07/12.   Current Outpatient Prescriptions on File Prior to Visit  Medication Sig Dispense Refill  . calcium citrate-vitamin D (CITRACAL+D) 315-200 MG-UNIT per tablet Take 1 tablet by mouth 2 (two) times daily. She takes Calcium Vitamin D 600-800 1 tab BID    . gabapentin (NEURONTIN) 300 MG capsule TAKE 2 CAPSULES BY MOUTH 3 TIMES A DAY 180 capsule 3  . QUEtiapine (SEROQUEL XR) 400 MG 24 hr tablet Take 400 mg by mouth at bedtime.    . vitamin B-12 (CYANOCOBALAMIN) 500 MCG tablet Take 500 mcg by mouth daily.    . CHANTIX STARTING MONTH PAK 0.5 MG X 11 & 1 MG X 42 tablet TAKE AS DIRECTED (Patient not taking: Reported on 07/15/2016) 53 tablet 0  . oxyCODONE (OXY IR/ROXICODONE) 5 MG immediate release tablet Take 1 tablet (5 mg total) by mouth every 8 (eight) hours as needed for severe pain. (Patient not taking: Reported on 01/06/2017) 30 tablet 0   No current  facility-administered medications on file prior to visit.    Allergies  Allergen Reactions  . Erythrocin Other (See Comments)    Thrush  . Ibuprofen     Other reaction(s): hives  . Sulfa Antibiotics Other (See Comments)    Thrush   . Trazodone And Nefazodone   . Tramadol Itching   Family History  Problem Relation Age of Onset  . Stroke Mother   . Diabetes Father   . Hypertension Father   . Hyperlipidemia Father   . Breast cancer Sister    PE: BP 118/74 (BP Location: Left Arm, Patient Position: Sitting)   Pulse 74   Wt 126 lb (  57.2 kg)   SpO2 95%   BMI 20.34 kg/m  Wt Readings from Last 3 Encounters:  01/06/17 126 lb (57.2 kg)  07/19/16 146 lb   11/26/14 143 lb 9.6 oz (65.1 kg)   Constitutional: normal weight, in NAD. No kyphosis. Eyes: PERRLA, EOMI, no exophthalmos ENT: moist mucous membranes, no thyromegaly, no cervical lymphadenopathy Cardiovascular: RRR, No MRG Respiratory: CTA B Gastrointestinal: abdomen soft, NT, ND, BS+ Musculoskeletal: no deformities, strength intact in all 4 Skin: moist, warm, no rashes Neurological: + mild tremor with outstretched hands, DTR normal in all 4  Assessment: 1. Hypercalcemia in the setting of hypoparathyroidism  Plan: Patient has had rapidly developing hypercalcemia, with the highest level being at 13.6. A corresponding intact PTH level was suppressed, at 14. I do not have a recent vitamin D level for her. However, a 1, 25 dihydroxy vitamin D level was low, corresponding to the low PTH level. - she has possible apparent complications from hypercalcemia: + abdominal pain (other than related to colitis, which is chronic for her), depression, weakness. - I reviewed along with the patient workup obtained by her PCP and discuss further concerns:  PTH is low >> her hypercalcemia is not parathyroid related  Multiple myeloma w/u was negative  1,25 di HO vitamin D is low >> likely no sarcoidosis  25 HO vitamin D not checked  recently but doubt hypervitaminosis from current supplement >> will check today  vitamin A not checked recently but doubt hypervitaminosis from current supplement >> will check today   PTHrp not high >> no PTHrp related malignancy  However, pt is a smoker for 40 years, she had signif. Weight loss of 20 lbs in last 5 mo, she has hoarseness, weakness, and new SOB with exertion >> high suspicion for lung cancer. The abrupt development of hypercalcemia is also suggestive of malignancy.  - We discussed possible long term consequences of hypercalcemia: ~1/3 pts will develop complications over 15 years (OP, nephrolithiasis). However, she has high-grade hypercalcemia and may need zoledronic acid infusion. We have to be careful as her kidney function was suppressed (I will recheck this today) - strongly advised her to stop calcium supplement (!). Although I do not suspect that this is the only culprit... - today we will check: Orders Placed This Encounter  Procedures  . VITAMIN D 25 Hydroxy (Vit-D Deficiency, Fractures)  . Vitamin A  . COMPLETE METABOLIC PANEL WITH GFR  - If labs are normal >> will advise her to see PCP for a CT scan of the chest or other possible malignancy workup. - Strongly advised her to drink plenty of water and reduce her milk intake, but not eliminate it completely  Component     Latest Ref Rng & Units 01/06/2017  VITD     30.00 - 100.00 ng/mL 40.19  Vitamin A (Retinoic Acid)     38 - 98 mcg/dL 66  Normal vitamins A and D.  Very elevated calcium with decreased kidney function: Component     Latest Ref Rng & Units 01/06/2017  Sodium     135 - 146 mmol/L 138  Potassium     3.5 - 5.3 mmol/L 3.5  Chloride     98 - 110 mmol/L 103  CO2     20 - 31 mmol/L 28  Glucose     65 - 99 mg/dL 90  BUN     7 - 25 mg/dL 19  Creatinine     0.50 - 0.99 mg/dL 1.26 (H)  Total Bilirubin     0.2 - 1.2 mg/dL 0.6  Alkaline Phosphatase     33 - 130 U/L 74  AST     10 - 35 U/L 19   ALT     6 - 29 U/L 8  Total Protein     6.1 - 8.1 g/dL 6.4  Albumin     3.6 - 5.1 g/dL 3.6  Calcium     8.6 - 10.4 mg/dL 15.0 (HH)  GFR, Est African American     >=60 mL/min 54 (L)  GFR, Est Non African American     >=60 mL/min 46 (L)   I called the patient and I advised her to go to the emergency room for hydration and possibly IV bisphosphonate. However, since the labs took a long time to return, and we also stop calcium at the time of the visit, her new calcium level in the ED was better, so she was not admitted:  Component     Latest Ref Rng & Units 01/13/2017  Sodium     135 - 145 mmol/L 142  Potassium     3.5 - 5.1 mmol/L 2.7 (LL)  Chloride     101 - 111 mmol/L 104  CO2     22 - 32 mmol/L 27  Glucose     65 - 99 mg/dL 76  BUN     6 - 20 mg/dL 15  Creatinine     0.44 - 1.00 mg/dL 1.13 (H)  Calcium     8.9 - 10.3 mg/dL 11.6 (H)  EGFR (Non-African Amer.)     >60 mL/min 52 (L)  EGFR (African American)     >60 mL/min 60 (L)  Anion gap     5 - 15 11   She was given potassium and discharged home. We repeated a CMP on 01/17/2017, and this returned completely normal: Component     Latest Ref Rng & Units 01/17/2017  Sodium     135 - 146 mmol/L 142  Potassium     3.5 - 5.3 mmol/L 3.9  Chloride     98 - 110 mmol/L 109  CO2     20 - 31 mmol/L 28  Glucose     65 - 99 mg/dL 78  BUN     7 - 25 mg/dL 16  Creatinine     0.50 - 0.99 mg/dL 0.87  Total Bilirubin     0.2 - 1.2 mg/dL 0.4  Alkaline Phosphatase     33 - 130 U/L 57  AST     10 - 35 U/L 22  ALT     6 - 29 U/L 9  Total Protein     6.1 - 8.1 g/dL 6.1  Albumin     3.6 - 5.1 g/dL 3.6  Calcium     8.6 - 10.4 mg/dL 9.5  GFR, Est African American     >=60 mL/min 84  GFR, Est Non African American     >=60 mL/min 73   Of note, while in the ED she had a chest x-ray which returned normal. I still believe that she may benefit from a chest CT, but will leave it at the latitude of her PCP.  I would like to  repeat her calcium level in 2 weeks to make sure they remain stable, but otherwise no further investigation is necessary. She absolutely needs to stay off calcium supplements.  Philemon Kingdom, MD PhD [facility]

## 2017-01-07 LAB — COMPLETE METABOLIC PANEL WITH GFR
ALBUMIN: 3.6 g/dL (ref 3.6–5.1)
ALK PHOS: 74 U/L (ref 33–130)
ALT: 8 U/L (ref 6–29)
AST: 19 U/L (ref 10–35)
BILIRUBIN TOTAL: 0.6 mg/dL (ref 0.2–1.2)
BUN: 19 mg/dL (ref 7–25)
CO2: 28 mmol/L (ref 20–31)
CREATININE: 1.26 mg/dL — AB (ref 0.50–0.99)
Calcium: 15 mg/dL (ref 8.6–10.4)
Chloride: 103 mmol/L (ref 98–110)
GFR, Est African American: 54 mL/min — ABNORMAL LOW (ref >=60)
GFR, Est Non African American: 46 mL/min — ABNORMAL LOW (ref >=60)
GLUCOSE: 90 mg/dL (ref 65–99)
POTASSIUM: 3.5 mmol/L (ref 3.5–5.3)
SODIUM: 138 mmol/L (ref 135–146)
TOTAL PROTEIN: 6.4 g/dL (ref 6.1–8.1)

## 2017-01-12 ENCOUNTER — Other Ambulatory Visit: Payer: Self-pay

## 2017-01-12 LAB — VITAMIN A: Vitamin A (Retinoic Acid): 66 ug/dL (ref 38–98)

## 2017-01-13 ENCOUNTER — Telehealth: Payer: Self-pay

## 2017-01-13 ENCOUNTER — Encounter (HOSPITAL_COMMUNITY): Payer: Self-pay | Admitting: Emergency Medicine

## 2017-01-13 ENCOUNTER — Emergency Department (HOSPITAL_COMMUNITY): Payer: Medicare Other

## 2017-01-13 ENCOUNTER — Emergency Department (HOSPITAL_COMMUNITY)
Admission: EM | Admit: 2017-01-13 | Discharge: 2017-01-13 | Disposition: A | Discharge status: Home / Self Care | Payer: Medicare Other | Attending: Emergency Medicine | Admitting: Emergency Medicine

## 2017-01-13 DIAGNOSIS — F17210 Nicotine dependence, cigarettes, uncomplicated: Secondary | ICD-10-CM | POA: Diagnosis not present

## 2017-01-13 DIAGNOSIS — E876 Hypokalemia: Secondary | ICD-10-CM

## 2017-01-13 DIAGNOSIS — F17290 Nicotine dependence, other tobacco product, uncomplicated: Secondary | ICD-10-CM | POA: Insufficient documentation

## 2017-01-13 DIAGNOSIS — Z79899 Other long term (current) drug therapy: Secondary | ICD-10-CM | POA: Diagnosis not present

## 2017-01-13 LAB — BASIC METABOLIC PANEL
ANION GAP: 11 (ref 5–15)
BUN: 15 mg/dL (ref 6–20)
CALCIUM: 11.6 mg/dL — AB (ref 8.9–10.3)
CO2: 27 mmol/L (ref 22–32)
Chloride: 104 mmol/L (ref 101–111)
Creatinine, Ser: 1.13 mg/dL — ABNORMAL HIGH (ref 0.44–1.00)
GFR, EST AFRICAN AMERICAN: 60 mL/min — AB (ref >60)
GFR, EST NON AFRICAN AMERICAN: 52 mL/min — AB (ref >60)
GLUCOSE: 76 mg/dL (ref 65–99)
Potassium: 2.7 mmol/L — CL (ref 3.5–5.1)
SODIUM: 142 mmol/L (ref 135–145)

## 2017-01-13 LAB — CBC
HCT: 32 % — ABNORMAL LOW (ref 36.0–46.0)
Hemoglobin: 10.9 g/dL — ABNORMAL LOW (ref 12.0–15.0)
MCH: 36.8 pg — ABNORMAL HIGH (ref 26.0–34.0)
MCHC: 34.1 g/dL (ref 30.0–36.0)
MCV: 108.1 fL — ABNORMAL HIGH (ref 78.0–100.0)
PLATELETS: 214 10*3/uL (ref 150–400)
RBC: 2.96 MIL/uL — AB (ref 3.87–5.11)
RDW: 13.1 % (ref 11.5–15.5)
WBC: 5.3 10*3/uL (ref 4.0–10.5)

## 2017-01-13 MED ORDER — POTASSIUM CHLORIDE ER 20 MEQ PO TBCR
20.0000 meq | EXTENDED_RELEASE_TABLET | Freq: Every day | ORAL | 0 refills | Status: DC
Start: 2017-01-13 — End: 2018-12-21

## 2017-01-13 MED ORDER — POTASSIUM CHLORIDE CRYS ER 20 MEQ PO TBCR
40.0000 meq | EXTENDED_RELEASE_TABLET | Freq: Once | ORAL | Status: AC
  Administered 2017-01-13: 40 meq via ORAL
  Filled 2017-01-13: qty 2

## 2017-01-13 NOTE — Discharge Instructions (Signed)
Please read attached information. If you experience any new or worsening signs or symptoms please return to the emergency room for evaluation. Please follow-up with your primary care provider or specialist in two days for repeat labs and evaluation. Please use medication prescribed only as directed and discontinue taking if you have any concerning signs or symptoms.

## 2017-01-13 NOTE — Telephone Encounter (Signed)
Called patient and was able to get Dr.Gherghe to speak with her. Will await instructions on what to do about the calcium.

## 2017-01-13 NOTE — ED Triage Notes (Signed)
Pt reports her regular doctor did blood work last Thursday and called her today telling her to come to the ED due to high calcium levels.pt a/o, nad.

## 2017-01-13 NOTE — ED Provider Notes (Signed)
[facility] DEPT Provider Note   CSN: [id number] Arrival date & time: 01/13/17  1041     History   Chief Complaint Chief Complaint  Patient presents with  . Abnormal Lab    HPI Monique Perry is a 61 y.o. female.  HPI   61 year old female presents today at the request of endocrinologists for evaluation. Patient was noted to have hypercalcemia starting in September. She has been followed by her primary care provider who sent her to endocrinology for evaluation for this. Patient reports that she takes calcium supplements 1200 mg plus vitamin D over the last 4 years, uncertain why she is taking this. She notes after seeing the endocrinologist she was encouraged to stop taking the supplemental calcium, with labs pending at the time of clinical evaluation. She reports a phone call today instructing her to come immediately to the emergency room due to elevated calcium (15). Patient reports that she has been feeling well. She denies any acute neurological deficits, denies any decreased by mouth intake, denies any vomiting or diarrhea. Patient reports baseline shortness of breath secondary to smoking, unchanged. She denies any fever or infectious illnesses. She has no recent changes in her medication, most recent change was back in December when she started to take Zoloft which caused some diarrhea.    Past Medical History:  Diagnosis Date  . Alcohol abuse last February 2012  . Arthritis   . Bipolar disorder ([place])   . Collagenous colitis   . Drug abuse last 1990's    Patient Active Problem List   Diagnosis Date Noted  . Splenic rupture 07/15/2016  . Serrated adenoma of cecum s/p polypectomy 2011 07/15/2016  . Neck pain on left side 11/02/2014  . Elevated transaminase level 02/14/2014  . Anemia 11/14/2013  . History of alcoholism ([place]) 11/12/2013  . Hepatitis B vaccination administered at current visit 11/12/2013  . Knee pain 06/18/2013  . Right foot pain s/p fusion surgery of  right foot 05/10/2013  . Fracture of metatarsal of right foot, closed 03/20/2013  . Abnormality of gait 03/20/2013  . Low back pain radiating to right leg 02/20/2013  . Chronic back pain 02/08/2013  . Atherosclerotic peripheral vascular disease with intermittent claudication ([place]) 02/08/2013  . Carotid bruit 02/08/2013  . High blood pressure 02/08/2013  . LSIL (low grade squamous intraepithelial lesion) on Pap smear 12/14/2012  . Osteopenia 10/02/2012  . Hyperlipidemia 08/31/2012  . Depression 08/31/2012  . left nose skin lesion 08/31/2012  . Drug abuse, history of   . Bipolar disorder ([place])   . Collagenous colitis   . Medically noncompliant 07/26/2012  . GERD (gastroesophageal reflux disease) 07/21/2012  . Tobacco abuse 07/21/2012    Past Surgical History:  Procedure Laterality Date  . ABDOMINAL HYSTERECTOMY  Age 46 years ago   partial   . FOOT SURGERY    . IR GENERIC HISTORICAL  07/16/2016   IR US GUIDE VASC ACCESS RIGHT 07/16/2016 Arne Cleveland, MD [facility]  . IR GENERIC HISTORICAL  07/16/2016   IR ANGIOGRAM VISCERAL SELECTIVE 07/16/2016 Arne Cleveland, MD [facility]  . IR GENERIC HISTORICAL  07/16/2016   IR ANGIOGRAM SELECTIVE EACH ADDITIONAL VESSEL 07/16/2016 Arne Cleveland, MD [facility]  . IR GENERIC HISTORICAL  07/16/2016   IR EMBO ART  VEN HEMORR LYMPH EXTRAV  INC GUIDE ROADMAPPING 07/16/2016 Arne Cleveland, MD [facility]  . IR GENERIC HISTORICAL  07/16/2016   IR ANGIOGRAM FOLLOW UP STUDY 07/16/2016 Arne Cleveland, MD [facility]  . Lapidus fusion  Right 04/17/2012  . NOSE SURGERY    . OSTEOTOMY Right 04/17/2012   Rt #2 Metatarsal  . TONSILLECTOMY  As a child   . WRIST SURGERY      OB History    Gravida Para Term Preterm AB Living   3 0     3 0   SAB TAB Ectopic Multiple Live Births   2 1 0 0        Obstetric Comments   Miscarriage due to drug abuse.       Home Medications    Prior to Admission medications   Medication Sig Start Date End Date Taking?  Authorizing Provider  albuterol (PROVENTIL HFA;VENTOLIN HFA) 108 (90 Base) MCG/ACT inhaler Inhale 2 puffs into the lungs every 6 (six) hours as needed for wheezing or shortness of breath.   Yes Historical Provider, MD  calcium citrate-vitamin D (CITRACAL+D) 315-200 MG-UNIT per tablet Take 1 tablet by mouth 2 (two) times daily. She takes Calcium Vitamin D 600-800 1 tab BID   Yes Historical Provider, MD  cyclobenzaprine (FLEXERIL) 10 MG tablet Take 10 mg by mouth 2 (two) times daily.   Yes Historical Provider, MD  cycloSPORINE (RESTASIS) 0.05 % ophthalmic emulsion Place 1 drop into both eyes 2 (two) times daily.   Yes Historical Provider, MD  fluticasone (FLONASE) 50 MCG/ACT nasal spray Place 1 spray into both nostrils daily.   Yes Historical Provider, MD  gabapentin (NEURONTIN) 300 MG capsule TAKE 2 CAPSULES BY MOUTH 3 TIMES A DAY Patient taking differently: TAKE 2 CAPSULES BY MOUTH 2 TIMES A DAY   Yes Gerda Diss, DO  Multiple Vitamin (MULTIVITAMIN WITH MINERALS) TABS tablet Take 1 tablet by mouth daily.   Yes Historical Provider, MD  QUEtiapine (SEROQUEL XR) 400 MG 24 hr tablet Take 400 mg by mouth at bedtime.   Yes Historical Provider, MD  sertraline (ZOLOFT) 50 MG tablet 1 tablet 12/08/16  Yes Historical Provider, MD  oxyCODONE (OXY IR/ROXICODONE) 5 MG immediate release tablet Take 1 tablet (5 mg total) by mouth every 8 (eight) hours as needed for severe pain. Patient not taking: Reported on 01/06/2017 07/26/16   Jerrye Beavers, PA-C  potassium chloride 20 MEQ TBCR Take 20 mEq by mouth daily. 01/13/17   Okey Regal, PA-C    Family History Family History  Problem Relation Age of Onset  . Stroke Mother   . Diabetes Father   . Hypertension Father   . Hyperlipidemia Father   . Breast cancer Sister     Social History Social History  Substance Use Topics  . Smoking status: Current Some Day Smoker    Packs/day: 0.50    Types: Cigarettes, Cigars  . Smokeless tobacco: Never Used      Comment: smoking cig for 35 years. continues to smoke 1/2 pack daily  . Alcohol use 0.0 oz/week     Comment: ETOH abuse for 25 year, 1/2 gallon Liquor daily. admits 6 weeks course of drinking ETOH as her only intake in 2011. she states that she quits her drinnking since Feb 2012.     Allergies   Erythrocin; Ibuprofen; Sulfa antibiotics; Trazodone and nefazodone; and Tramadol   Review of Systems Review of Systems  All other systems reviewed and are negative.    Physical Exam Updated Vital Signs BP 116/56 (BP Location: Right Arm)   Pulse 79   Temp 97.6 F (36.4 C)   Resp 20   Ht 5\' 6"  (1.676 m)   Wt 56.7 kg  SpO2 94%   BMI 20.18 kg/m   Physical Exam  Constitutional: She is oriented to person, place, and time. She appears well-developed and well-nourished.  HENT:  Head: Normocephalic and atraumatic.  Eyes: Conjunctivae are normal. Pupils are equal, round, and reactive to light. Right eye exhibits no discharge. Left eye exhibits no discharge. No scleral icterus.  Neck: Normal range of motion. No JVD present. No tracheal deviation present.  Cardiovascular: Normal rate, regular rhythm and normal heart sounds.   Pulmonary/Chest: Effort normal. No stridor. No respiratory distress. She has no wheezes. She has no rales.  Musculoskeletal: Normal range of motion. She exhibits no edema.  Neurological: She is alert and oriented to person, place, and time. No cranial nerve deficit or sensory deficit. Coordination normal.  Psychiatric: She has a normal mood and affect. Her behavior is normal. Judgment and thought content normal.  Nursing note and vitals reviewed.   ED Treatments / Results  Labs (all labs ordered are listed, but only abnormal results are displayed) Labs Reviewed  CBC - Abnormal; Notable for the following:       Result Value   RBC 2.96 (*)    Hemoglobin 10.9 (*)    HCT 32.0 (*)    MCV 108.1 (*)    MCH 36.8 (*)    All other components within normal limits  BASIC  METABOLIC PANEL - Abnormal; Notable for the following:    Potassium 2.7 (*)    Creatinine, Ser 1.13 (*)    Calcium 11.6 (*)    GFR calc non Af Amer 52 (*)    GFR calc Af Amer 60 (*)    All other components within normal limits    EKG  EKG Interpretation  Date/Time:  Thursday January 13 2017 13:55:30 EST Ventricular Rate:  66 PR Interval:    QRS Duration: 93 QT Interval:  382 QTC Calculation: 401 R Axis:   53 Text Interpretation:  Sinus rhythm Confirmed by Rogene Houston  MD, SCOTT ([id number]) on 01/13/2017 2:02:35 PM       Radiology Dg Chest 2 View  Result Date: 01/13/2017 CLINICAL DATA:  Hypercalcemia.  Smoker. EXAM: CHEST  2 VIEW COMPARISON:  CT chest 04/08/2015. FINDINGS: The chest is hyperexpanded but the lungs are clear. Heart size is normal. No pneumothorax or pleural effusion. Atherosclerotic vascular disease is noted. Vascular coils in the left upper quadrant of the abdomen are seen. IMPRESSION: No acute disease. Appearance of the chest compatible with COPD. Atherosclerosis. Electronically Signed   By: Inge Rise M.D.   On: 01/13/2017 15:27    Procedures Procedures (including critical care time)  Medications Ordered in ED Medications  potassium chloride SA (K-DUR,KLOR-CON) CR tablet 40 mEq (40 mEq Oral Given 01/13/17 1404)     Initial Impression / Assessment and Plan / ED Course  I have reviewed the triage vital signs and the nursing notes.  Pertinent labs & imaging results that were available during my care of the patient were reviewed by me and considered in my medical decision making (see chart for details).     Final Clinical Impressions(s) / ED Diagnoses   Final diagnoses:  Hypokalemia  Hypercalcemia    Labs: CBC, BMP-  Potassium 2.7, Calcium 11.6  Imaging: DG chest 2 view  Consults:  Therapeutics: Oral potassium  Discharge Meds:   Assessment/Plan: 77-year-old female presents today at the request of endocrinology for evaluation of her hypercalcemia.  Patient was noted to have a calcium level of 15 six  days ago when this  was drawn. Patient has been asymptomatic. Patient notes since then she has not been taking her calcium supplements. Her calcium here today is 11.6. Patient's labs significant for potassium of 2.7, uncertain etiology at this time. Patient well-appearing with no significant changes on her EKG. She was given oral potassium supplements. Patient in no acute distress with no findings that would require hospital evaluation. Patient will have very close follow-up with her endocrinologist and primary care for repeat laboratory analysis 2 days. She is instructed to return to the emergency room if she develops any new or worsening signs or symptoms. Patient was happy with this plan, verbalizes her understanding to it, had no further questions or concerns at the time discharge    New Prescriptions Discharge Medication List as of 01/13/2017  3:48 PM    START taking these medications   Details  potassium chloride 20 MEQ TBCR Take 20 mEq by mouth daily., Starting Thu 01/13/2017, Print         [employer], PA-C 01/13/17 [place], MD 01/14/17 [phone]

## 2017-01-14 ENCOUNTER — Other Ambulatory Visit: Payer: Self-pay

## 2017-01-17 ENCOUNTER — Other Ambulatory Visit: Payer: Medicare Other

## 2017-01-17 LAB — COMPLETE METABOLIC PANEL WITH GFR
ALT: 9 U/L (ref 6–29)
AST: 22 U/L (ref 10–35)
Albumin: 3.6 g/dL (ref 3.6–5.1)
Alkaline Phosphatase: 57 U/L (ref 33–130)
BUN: 16 mg/dL (ref 7–25)
CHLORIDE: 109 mmol/L (ref 98–110)
CO2: 28 mmol/L (ref 20–31)
CREATININE: 0.87 mg/dL (ref 0.50–0.99)
Calcium: 9.5 mg/dL (ref 8.6–10.4)
GFR, EST AFRICAN AMERICAN: 84 mL/min (ref >=60)
GFR, Est Non African American: 73 mL/min (ref >=60)
GLUCOSE: 78 mg/dL (ref 65–99)
Potassium: 3.9 mmol/L (ref 3.5–5.3)
SODIUM: 142 mmol/L (ref 135–146)
TOTAL PROTEIN: 6.1 g/dL (ref 6.1–8.1)
Total Bilirubin: 0.4 mg/dL (ref 0.2–1.2)

## 2017-01-18 ENCOUNTER — Encounter: Payer: Self-pay | Admitting: Internal Medicine

## 2017-02-01 ENCOUNTER — Other Ambulatory Visit: Payer: Medicare Other

## 2017-02-01 LAB — BASIC METABOLIC PANEL WITH GFR
BUN: 9 mg/dL (ref 7–25)
CALCIUM: 9.2 mg/dL (ref 8.6–10.4)
CO2: 26 mmol/L (ref 20–31)
Chloride: 110 mmol/L (ref 98–110)
Creat: 0.9 mg/dL (ref 0.50–0.99)
GFR, EST AFRICAN AMERICAN: 80 mL/min (ref >=60)
GFR, Est Non African American: 70 mL/min (ref >=60)
GLUCOSE: 93 mg/dL (ref 65–99)
POTASSIUM: 4.2 mmol/L (ref 3.5–5.3)
Sodium: 142 mmol/L (ref 135–146)

## 2017-04-19 DIAGNOSIS — Z720 Tobacco use: Secondary | ICD-10-CM | POA: Diagnosis not present

## 2017-04-19 DIAGNOSIS — Z23 Encounter for immunization: Secondary | ICD-10-CM | POA: Diagnosis not present

## 2017-04-19 DIAGNOSIS — J309 Allergic rhinitis, unspecified: Secondary | ICD-10-CM | POA: Diagnosis not present

## 2017-04-19 DIAGNOSIS — Z9081 Acquired absence of spleen: Secondary | ICD-10-CM | POA: Diagnosis not present

## 2017-04-19 DIAGNOSIS — M859 Disorder of bone density and structure, unspecified: Secondary | ICD-10-CM | POA: Diagnosis not present

## 2017-04-19 DIAGNOSIS — Z0000 Encounter for general adult medical examination without abnormal findings: Secondary | ICD-10-CM | POA: Diagnosis not present

## 2017-04-19 DIAGNOSIS — F319 Bipolar disorder, unspecified: Secondary | ICD-10-CM | POA: Diagnosis not present

## 2017-04-26 ENCOUNTER — Other Ambulatory Visit: Payer: Self-pay | Admitting: Acute Care

## 2017-04-26 DIAGNOSIS — F17210 Nicotine dependence, cigarettes, uncomplicated: Secondary | ICD-10-CM

## 2017-05-04 ENCOUNTER — Encounter: Payer: Self-pay | Admitting: Acute Care

## 2017-05-04 ENCOUNTER — Ambulatory Visit (INDEPENDENT_AMBULATORY_CARE_PROVIDER_SITE_OTHER)
Admission: RE | Admit: 2017-05-04 | Discharge: 2017-05-04 | Disposition: A | Discharge status: Home / Self Care | Payer: Medicare Other | Source: Ambulatory Visit | Attending: Acute Care | Admitting: Acute Care

## 2017-05-04 ENCOUNTER — Ambulatory Visit (INDEPENDENT_AMBULATORY_CARE_PROVIDER_SITE_OTHER): Payer: Medicare Other | Admitting: Acute Care

## 2017-05-04 DIAGNOSIS — Z87891 Personal history of nicotine dependence: Secondary | ICD-10-CM | POA: Diagnosis not present

## 2017-05-04 DIAGNOSIS — F17210 Nicotine dependence, cigarettes, uncomplicated: Secondary | ICD-10-CM

## 2017-05-04 NOTE — Progress Notes (Signed)
Shared Decision Making Visit Lung Cancer Screening Program [phone])   Eligibility:  Age 61 y.o.  Pack Years Smoking History Calculation 33 pack year smoking history (# packs/per year x # years smoked)  Recent History of coughing up blood  no  Unexplained weight loss? no ( >Than 15 pounds within the last 6 months )  Prior History Lung / other cancer no (Diagnosis within the last 5 years already requiring surveillance chest CT Scans).  Smoking Status Current Smoker  Former Smokers: Years since quit: NA  Quit Date: NA  Visit Components:  Discussion included one or more decision making aids. yes  Discussion included risk/benefits of screening. yes  Discussion included potential follow up diagnostic testing for abnormal scans. yes  Discussion included meaning and risk of over diagnosis. yes  Discussion included meaning and risk of False Positives. yes  Discussion included meaning of total radiation exposure. yes  Counseling Included:  Importance of adherence to annual lung cancer LDCT screening. yes  Impact of comorbidities on ability to participate in the program. yes  Ability and willingness to under diagnostic treatment. yes  Smoking Cessation Counseling:  Current Smokers:   Discussed importance of smoking cessation. yes  Information about tobacco cessation classes and interventions provided to patient. yes  Patient provided with "ticket" for LDCT Scan. yes  Symptomatic Patient. no  Counseling: NA  Diagnosis Code: Tobacco Use Z72.0  Asymptomatic Patient yes  Counseling (Intermediate counseling: > three minutes counseling) J0932  Former Smokers:   Discussed the importance of maintaining cigarette abstinence. yes  Diagnosis Code: Personal History of Nicotine Dependence. I71.245  Information about tobacco cessation classes and interventions provided to patient. Yes  Patient provided with "ticket" for LDCT Scan. yes  Written Order for Lung Cancer  Screening with LDCT placed in Epic. Yes (CT Chest Lung Cancer Screening Low Dose W/O CM) [id number] Z12.2-Screening of respiratory organs Z87.891-Personal history of nicotine dependence  I have spent 25 minutes of face to face time with Monique Perry discussing the risks and benefits of lung cancer screening. We viewed a power point together that explained in detail the above noted topics. We paused at intervals to allow for questions to be asked and answered to ensure understanding.We discussed that the single most powerful action that she can take to decrease her risk of developing lung cancer is to quit smoking. We discussed whether or not she is ready to commit to setting a quit date. She is currently not ready to set a quit date.We discussed options for tools to aid in quitting smoking including nicotine replacement therapy, non-nicotine medications, support groups, Quit Smart classes, and behavior modification. We discussed that often times setting smaller, more achievable goals, such as eliminating 1 cigarette a day for a week and then 2 cigarettes a day for a week can be helpful in slowly decreasing the number of cigarettes smoked. This allows for a sense of accomplishment as well as providing a clinical benefit. I gave her the " Be Stronger Than Your Excuses" card with contact information for community resources, classes, free nicotine replacement therapy, and access to mobile apps, text messaging, and on-line smoking cessation help. I have also given her my card and contact information in the event she needs to contact me. We discussed the time and location of the scan, and that either Doroteo Glassman RN or I will call with the results within 24-48 hours of receiving them. I have offered her a copy of the power point we viewed  as  a resource in the event they need reinforcement of the concepts we discussed today in the office. The patient verbalized understanding of all of  the above and had no further  questions upon leaving the office. They have my contact information in the event they have any further questions.  I spent 4 minutes counseling on smoking cessation and the health risks of continued tobacco abuse.  I explained to the patient that there has been a high incidence of coronary artery disease noted on these exams. I explained that this is a non-gated exam therefore degree or severity cannot be determined. This patient is not on statin therapy. I have asked the patient to follow-up with their PCP regarding any incidental finding of coronary artery disease and management with risk factor assessment, diet or medication as their PCP  feels is clinically indicated. The patient verbalized understanding of the above and had no further questions upon completion of the visit.      Magdalen Spatz, NP 05/04/2017

## 2017-05-06 DIAGNOSIS — H538 Other visual disturbances: Secondary | ICD-10-CM | POA: Diagnosis not present

## 2017-05-06 DIAGNOSIS — H2513 Age-related nuclear cataract, bilateral: Secondary | ICD-10-CM | POA: Diagnosis not present

## 2017-05-06 DIAGNOSIS — H029 Unspecified disorder of eyelid: Secondary | ICD-10-CM | POA: Diagnosis not present

## 2017-05-11 DIAGNOSIS — H029 Unspecified disorder of eyelid: Secondary | ICD-10-CM | POA: Diagnosis not present

## 2017-05-11 DIAGNOSIS — A630 Anogenital (venereal) warts: Secondary | ICD-10-CM | POA: Diagnosis not present

## 2017-05-11 DIAGNOSIS — L720 Epidermal cyst: Secondary | ICD-10-CM | POA: Diagnosis not present

## 2017-05-12 ENCOUNTER — Other Ambulatory Visit: Payer: Self-pay | Admitting: Acute Care

## 2017-05-12 DIAGNOSIS — F17210 Nicotine dependence, cigarettes, uncomplicated: Secondary | ICD-10-CM

## 2017-10-21 DIAGNOSIS — M5136 Other intervertebral disc degeneration, lumbar region: Secondary | ICD-10-CM | POA: Diagnosis not present

## 2017-10-21 DIAGNOSIS — Z9081 Acquired absence of spleen: Secondary | ICD-10-CM | POA: Diagnosis not present

## 2017-10-21 DIAGNOSIS — F319 Bipolar disorder, unspecified: Secondary | ICD-10-CM | POA: Diagnosis not present

## 2017-10-22 DIAGNOSIS — Z23 Encounter for immunization: Secondary | ICD-10-CM | POA: Diagnosis not present

## 2017-10-26 DIAGNOSIS — Z23 Encounter for immunization: Secondary | ICD-10-CM | POA: Diagnosis not present

## 2017-11-04 DIAGNOSIS — S96911A Strain of unspecified muscle and tendon at ankle and foot level, right foot, initial encounter: Secondary | ICD-10-CM | POA: Diagnosis not present

## 2017-11-04 DIAGNOSIS — S82831A Other fracture of upper and lower end of right fibula, initial encounter for closed fracture: Secondary | ICD-10-CM | POA: Diagnosis not present

## 2017-11-04 DIAGNOSIS — S82891A Other fracture of right lower leg, initial encounter for closed fracture: Secondary | ICD-10-CM | POA: Diagnosis not present

## 2017-11-07 DIAGNOSIS — M25571 Pain in right ankle and joints of right foot: Secondary | ICD-10-CM | POA: Diagnosis not present

## 2017-11-07 DIAGNOSIS — S8264XA Nondisplaced fracture of lateral malleolus of right fibula, initial encounter for closed fracture: Secondary | ICD-10-CM | POA: Diagnosis not present

## 2017-11-16 DIAGNOSIS — M25571 Pain in right ankle and joints of right foot: Secondary | ICD-10-CM | POA: Diagnosis not present

## 2017-11-16 DIAGNOSIS — S8264XA Nondisplaced fracture of lateral malleolus of right fibula, initial encounter for closed fracture: Secondary | ICD-10-CM | POA: Diagnosis not present

## 2017-11-30 DIAGNOSIS — M25571 Pain in right ankle and joints of right foot: Secondary | ICD-10-CM | POA: Diagnosis not present

## 2017-12-21 DIAGNOSIS — S8264XA Nondisplaced fracture of lateral malleolus of right fibula, initial encounter for closed fracture: Secondary | ICD-10-CM | POA: Diagnosis not present

## 2017-12-22 ENCOUNTER — Other Ambulatory Visit: Payer: Self-pay | Admitting: Family Medicine

## 2017-12-24 ENCOUNTER — Other Ambulatory Visit: Payer: Self-pay | Admitting: Family Medicine

## 2017-12-24 DIAGNOSIS — R5381 Other malaise: Secondary | ICD-10-CM

## 2017-12-27 ENCOUNTER — Other Ambulatory Visit: Payer: Self-pay | Admitting: Family Medicine

## 2017-12-27 DIAGNOSIS — M8580 Other specified disorders of bone density and structure, unspecified site: Secondary | ICD-10-CM

## 2018-01-30 DIAGNOSIS — S8264XA Nondisplaced fracture of lateral malleolus of right fibula, initial encounter for closed fracture: Secondary | ICD-10-CM | POA: Diagnosis not present

## 2018-02-01 ENCOUNTER — Other Ambulatory Visit: Payer: Self-pay | Admitting: Family Medicine

## 2018-02-01 DIAGNOSIS — Z1231 Encounter for screening mammogram for malignant neoplasm of breast: Secondary | ICD-10-CM

## 2018-02-09 ENCOUNTER — Ambulatory Visit
Admission: RE | Admit: 2018-02-09 | Discharge: 2018-02-09 | Disposition: A | Discharge status: Home / Self Care | Payer: Medicare HMO | Source: Ambulatory Visit | Attending: Family Medicine | Admitting: Family Medicine

## 2018-02-09 DIAGNOSIS — Z1231 Encounter for screening mammogram for malignant neoplasm of breast: Secondary | ICD-10-CM

## 2018-02-21 ENCOUNTER — Ambulatory Visit
Admission: RE | Admit: 2018-02-21 | Discharge: 2018-02-21 | Disposition: A | Discharge status: Home / Self Care | Payer: Medicare HMO | Source: Ambulatory Visit | Attending: Family Medicine | Admitting: Family Medicine

## 2018-02-21 DIAGNOSIS — M85851 Other specified disorders of bone density and structure, right thigh: Secondary | ICD-10-CM | POA: Diagnosis not present

## 2018-02-21 DIAGNOSIS — Z780 Asymptomatic menopausal state: Secondary | ICD-10-CM | POA: Diagnosis not present

## 2018-02-21 DIAGNOSIS — M8580 Other specified disorders of bone density and structure, unspecified site: Secondary | ICD-10-CM

## 2018-02-28 DIAGNOSIS — M859 Disorder of bone density and structure, unspecified: Secondary | ICD-10-CM | POA: Diagnosis not present

## 2018-05-01 DIAGNOSIS — R55 Syncope and collapse: Secondary | ICD-10-CM | POA: Diagnosis not present

## 2018-05-01 DIAGNOSIS — Z720 Tobacco use: Secondary | ICD-10-CM | POA: Diagnosis not present

## 2018-05-01 DIAGNOSIS — Z0000 Encounter for general adult medical examination without abnormal findings: Secondary | ICD-10-CM | POA: Diagnosis not present

## 2018-05-01 DIAGNOSIS — D126 Benign neoplasm of colon, unspecified: Secondary | ICD-10-CM | POA: Diagnosis not present

## 2018-05-01 DIAGNOSIS — E559 Vitamin D deficiency, unspecified: Secondary | ICD-10-CM | POA: Diagnosis not present

## 2018-05-01 DIAGNOSIS — M810 Age-related osteoporosis without current pathological fracture: Secondary | ICD-10-CM | POA: Diagnosis not present

## 2018-05-01 DIAGNOSIS — E781 Pure hyperglyceridemia: Secondary | ICD-10-CM | POA: Diagnosis not present

## 2018-05-01 DIAGNOSIS — Z9081 Acquired absence of spleen: Secondary | ICD-10-CM | POA: Diagnosis not present

## 2018-05-01 DIAGNOSIS — Z1389 Encounter for screening for other disorder: Secondary | ICD-10-CM | POA: Diagnosis not present

## 2018-05-03 DIAGNOSIS — E785 Hyperlipidemia, unspecified: Secondary | ICD-10-CM | POA: Diagnosis not present

## 2018-05-03 DIAGNOSIS — R011 Cardiac murmur, unspecified: Secondary | ICD-10-CM | POA: Diagnosis not present

## 2018-05-03 DIAGNOSIS — R55 Syncope and collapse: Secondary | ICD-10-CM | POA: Diagnosis not present

## 2018-05-04 DIAGNOSIS — R011 Cardiac murmur, unspecified: Secondary | ICD-10-CM | POA: Diagnosis not present

## 2018-05-04 DIAGNOSIS — R55 Syncope and collapse: Secondary | ICD-10-CM | POA: Diagnosis not present

## 2018-05-04 DIAGNOSIS — E785 Hyperlipidemia, unspecified: Secondary | ICD-10-CM | POA: Diagnosis not present

## 2018-05-05 ENCOUNTER — Ambulatory Visit (INDEPENDENT_AMBULATORY_CARE_PROVIDER_SITE_OTHER)
Admission: RE | Admit: 2018-05-05 | Discharge: 2018-05-05 | Disposition: A | Discharge status: Home / Self Care | Payer: Medicare HMO | Source: Ambulatory Visit | Attending: Acute Care | Admitting: Acute Care

## 2018-05-05 DIAGNOSIS — F17210 Nicotine dependence, cigarettes, uncomplicated: Secondary | ICD-10-CM

## 2018-05-05 DIAGNOSIS — Z87891 Personal history of nicotine dependence: Secondary | ICD-10-CM | POA: Diagnosis not present

## 2018-05-09 DIAGNOSIS — H2511 Age-related nuclear cataract, right eye: Secondary | ICD-10-CM | POA: Diagnosis not present

## 2018-05-09 DIAGNOSIS — H04129 Dry eye syndrome of unspecified lacrimal gland: Secondary | ICD-10-CM | POA: Diagnosis not present

## 2018-05-09 DIAGNOSIS — H538 Other visual disturbances: Secondary | ICD-10-CM | POA: Diagnosis not present

## 2018-05-09 DIAGNOSIS — H2513 Age-related nuclear cataract, bilateral: Secondary | ICD-10-CM | POA: Diagnosis not present

## 2018-05-09 DIAGNOSIS — D7589 Other specified diseases of blood and blood-forming organs: Secondary | ICD-10-CM | POA: Diagnosis not present

## 2018-05-15 ENCOUNTER — Telehealth: Payer: Self-pay | Admitting: Physician Assistant

## 2018-05-15 NOTE — Telephone Encounter (Signed)
I received a page from the monitor company saying the patient had reported an episode of syncope.  I reviewed the telemetry with the company.  She had been sinus rhythm/sinus tachycardia with a heart rate of 113 at the time she reported the syncope.  Monique Perry

## 2018-05-17 ENCOUNTER — Other Ambulatory Visit: Payer: Self-pay | Admitting: Acute Care

## 2018-05-17 DIAGNOSIS — F17210 Nicotine dependence, cigarettes, uncomplicated: Secondary | ICD-10-CM

## 2018-05-17 DIAGNOSIS — Z122 Encounter for screening for malignant neoplasm of respiratory organs: Secondary | ICD-10-CM

## 2018-05-22 DIAGNOSIS — R011 Cardiac murmur, unspecified: Secondary | ICD-10-CM | POA: Diagnosis not present

## 2018-06-05 DIAGNOSIS — R011 Cardiac murmur, unspecified: Secondary | ICD-10-CM | POA: Diagnosis not present

## 2018-06-05 DIAGNOSIS — I421 Obstructive hypertrophic cardiomyopathy: Secondary | ICD-10-CM | POA: Diagnosis not present

## 2018-06-05 DIAGNOSIS — R55 Syncope and collapse: Secondary | ICD-10-CM | POA: Diagnosis not present

## 2018-06-05 DIAGNOSIS — E785 Hyperlipidemia, unspecified: Secondary | ICD-10-CM | POA: Diagnosis not present

## 2018-09-14 DIAGNOSIS — D0462 Carcinoma in situ of skin of left upper limb, including shoulder: Secondary | ICD-10-CM | POA: Diagnosis not present

## 2018-09-14 DIAGNOSIS — L820 Inflamed seborrheic keratosis: Secondary | ICD-10-CM | POA: Diagnosis not present

## 2018-09-14 DIAGNOSIS — L821 Other seborrheic keratosis: Secondary | ICD-10-CM | POA: Diagnosis not present

## 2018-09-14 DIAGNOSIS — L720 Epidermal cyst: Secondary | ICD-10-CM | POA: Diagnosis not present

## 2018-09-14 DIAGNOSIS — D485 Neoplasm of uncertain behavior of skin: Secondary | ICD-10-CM | POA: Diagnosis not present

## 2018-09-14 DIAGNOSIS — B078 Other viral warts: Secondary | ICD-10-CM | POA: Diagnosis not present

## 2018-09-26 DIAGNOSIS — D0462 Carcinoma in situ of skin of left upper limb, including shoulder: Secondary | ICD-10-CM | POA: Diagnosis not present

## 2018-10-23 DIAGNOSIS — R109 Unspecified abdominal pain: Secondary | ICD-10-CM | POA: Diagnosis not present

## 2018-10-31 ENCOUNTER — Other Ambulatory Visit: Payer: Self-pay | Admitting: Family Medicine

## 2018-10-31 DIAGNOSIS — R1084 Generalized abdominal pain: Secondary | ICD-10-CM

## 2018-11-01 ENCOUNTER — Ambulatory Visit
Admission: RE | Admit: 2018-11-01 | Discharge: 2018-11-01 | Disposition: A | Discharge status: Home / Self Care | Payer: Medicare HMO | Source: Ambulatory Visit | Attending: Family Medicine | Admitting: Family Medicine

## 2018-11-01 DIAGNOSIS — R1084 Generalized abdominal pain: Secondary | ICD-10-CM

## 2018-11-01 DIAGNOSIS — D539 Nutritional anemia, unspecified: Secondary | ICD-10-CM | POA: Diagnosis not present

## 2018-11-01 DIAGNOSIS — R197 Diarrhea, unspecified: Secondary | ICD-10-CM | POA: Diagnosis not present

## 2018-11-01 DIAGNOSIS — R634 Abnormal weight loss: Secondary | ICD-10-CM | POA: Diagnosis not present

## 2018-11-01 DIAGNOSIS — K760 Fatty (change of) liver, not elsewhere classified: Secondary | ICD-10-CM | POA: Diagnosis not present

## 2018-11-01 DIAGNOSIS — K529 Noninfective gastroenteritis and colitis, unspecified: Secondary | ICD-10-CM | POA: Diagnosis not present

## 2018-11-01 MED ORDER — IOPAMIDOL (ISOVUE-300) INJECTION 61%
100.0000 mL | Freq: Once | INTRAVENOUS | Status: AC | PRN
  Administered 2018-11-01: 100 mL via INTRAVENOUS

## 2018-11-06 DIAGNOSIS — K529 Noninfective gastroenteritis and colitis, unspecified: Secondary | ICD-10-CM | POA: Diagnosis not present

## 2018-11-17 DIAGNOSIS — Z09 Encounter for follow-up examination after completed treatment for conditions other than malignant neoplasm: Secondary | ICD-10-CM | POA: Diagnosis not present

## 2018-11-17 DIAGNOSIS — K529 Noninfective gastroenteritis and colitis, unspecified: Secondary | ICD-10-CM | POA: Diagnosis not present

## 2018-11-20 ENCOUNTER — Encounter (HOSPITAL_COMMUNITY): Payer: Self-pay | Admitting: Emergency Medicine

## 2018-11-20 ENCOUNTER — Emergency Department (HOSPITAL_COMMUNITY)
Admission: EM | Admit: 2018-11-20 | Discharge: 2018-11-20 | Disposition: A | Discharge status: Home / Self Care | Payer: Medicare HMO | Attending: Emergency Medicine | Admitting: Emergency Medicine

## 2018-11-20 DIAGNOSIS — G609 Hereditary and idiopathic neuropathy, unspecified: Secondary | ICD-10-CM | POA: Diagnosis not present

## 2018-11-20 DIAGNOSIS — G629 Polyneuropathy, unspecified: Secondary | ICD-10-CM | POA: Diagnosis not present

## 2018-11-20 DIAGNOSIS — R200 Anesthesia of skin: Secondary | ICD-10-CM | POA: Diagnosis present

## 2018-11-20 DIAGNOSIS — F17210 Nicotine dependence, cigarettes, uncomplicated: Secondary | ICD-10-CM | POA: Insufficient documentation

## 2018-11-20 DIAGNOSIS — Z681 Body mass index (BMI) 19 or less, adult: Secondary | ICD-10-CM | POA: Diagnosis not present

## 2018-11-20 DIAGNOSIS — G608 Other hereditary and idiopathic neuropathies: Secondary | ICD-10-CM | POA: Diagnosis not present

## 2018-11-20 DIAGNOSIS — Z720 Tobacco use: Secondary | ICD-10-CM | POA: Diagnosis not present

## 2018-11-20 LAB — BASIC METABOLIC PANEL
Anion gap: 15 (ref 5–15)
BUN: 8 mg/dL (ref 8–23)
CO2: 23 mmol/L (ref 22–32)
Calcium: 8.5 mg/dL — ABNORMAL LOW (ref 8.9–10.3)
Chloride: 100 mmol/L (ref 98–111)
Creatinine, Ser: 0.85 mg/dL (ref 0.44–1.00)
GFR calc Af Amer: >60 mL/min (ref >60)
GFR calc non Af Amer: >60 mL/min (ref >60)
Glucose, Bld: 90 mg/dL (ref 70–99)
Potassium: 3.4 mmol/L — ABNORMAL LOW (ref 3.5–5.1)
Sodium: 138 mmol/L (ref 135–145)

## 2018-11-20 LAB — CBC
HCT: 29.7 % — ABNORMAL LOW (ref 36.0–46.0)
Hemoglobin: 9.5 g/dL — ABNORMAL LOW (ref 12.0–15.0)
MCH: 33.9 pg (ref 26.0–34.0)
MCHC: 32 g/dL (ref 30.0–36.0)
MCV: 106.1 fL — ABNORMAL HIGH (ref 80.0–100.0)
NRBC: 0 % (ref 0.0–0.2)
Platelets: 302 10*3/uL (ref 150–400)
RBC: 2.8 MIL/uL — AB (ref 3.87–5.11)
RDW: 15.9 % — ABNORMAL HIGH (ref 11.5–15.5)
WBC: 6.7 10*3/uL (ref 4.0–10.5)

## 2018-11-20 NOTE — ED Triage Notes (Signed)
Pt arrives to ED pov with c/o of bilateral numbness in both feet from ankle down. Pt state this began last week after finishing antibiotics for colitis. Pt states this has never happened between.

## 2018-11-20 NOTE — ED Provider Notes (Signed)
[facility] Emergency Department Provider Note MRN:  [id number]  Arrival date & time: 11/20/18     Chief Complaint   Numbness   History of Present Illness   Monique Perry is a 62 y.o. year-old female with a history of alcohol abuse, tobacco abuse presenting to the ED with chief complaint of numbness.  Patient was diagnosed with a recent case of colitis several weeks ago, completed a course of antibiotics.  For the past week, has had progressively worsening numbness to the bilateral feet up to the ankles and lower shin.  Began as a tingling and/or pins-and-needles sensation, becoming more numb with time.  Denies headache or vision change, no chest pain or shortness of breath, no abdominal pain, no bowel or bladder dysfunction, no weakness.  Sent here by GI specialist for evaluation.  Review of Systems  A complete 10 system review of systems was obtained and all systems are negative except as noted in the HPI and PMH.   Patient's Health History    Past Medical History:  Diagnosis Date  . Alcohol abuse last February 2012  . Arthritis   . Bipolar disorder ([place])   . Collagenous colitis   . Drug abuse ([place]) last 1990's    Past Surgical History:  Procedure Laterality Date  . ABDOMINAL HYSTERECTOMY  Age 60 years ago   partial   . FOOT SURGERY    . IR GENERIC HISTORICAL  07/16/2016   IR US GUIDE VASC ACCESS RIGHT 07/16/2016 Arne Cleveland, MD [facility]  . IR GENERIC HISTORICAL  07/16/2016   IR ANGIOGRAM VISCERAL SELECTIVE 07/16/2016 Arne Cleveland, MD [facility]  . IR GENERIC HISTORICAL  07/16/2016   IR ANGIOGRAM SELECTIVE EACH ADDITIONAL VESSEL 07/16/2016 Arne Cleveland, MD [facility]  . IR GENERIC HISTORICAL  07/16/2016   IR EMBO ART  VEN HEMORR LYMPH EXTRAV  INC GUIDE ROADMAPPING 07/16/2016 Arne Cleveland, MD [facility]  . IR GENERIC HISTORICAL  07/16/2016   IR ANGIOGRAM FOLLOW UP STUDY 07/16/2016 Arne Cleveland, MD [facility]  . Lapidus fusion Right 04/17/2012  .  NOSE SURGERY    . OSTEOTOMY Right 04/17/2012   Rt #2 Metatarsal  . TONSILLECTOMY  As a child   . WRIST SURGERY      Family History  Problem Relation Age of Onset  . Stroke Mother   . Diabetes Father   . Hypertension Father   . Hyperlipidemia Father   . Breast cancer Sister     Social History   Socioeconomic History  . Marital status: Single    Spouse name: Not on file  . Number of children: Not on file  . Years of education: Not on file  . Highest education level: Not on file  Occupational History  . Not on file  Social Needs  . Financial resource strain: Not on file  . Food insecurity:    Worry: Not on file    Inability: Not on file  . Transportation needs:    Medical: Not on file    Non-medical: Not on file  Tobacco Use  . Smoking status: Current Some Day Smoker    Packs/day: 0.75    Years: 45.00    Pack years: 33.75    Types: Cigarettes, Cigars  . Smokeless tobacco: Never Used  . Tobacco comment: smoking cig for 35 years. continues to smoke 1/2 pack daily  Substance and Sexual Activity  . Alcohol use: Yes    Alcohol/week: 0.0 standard drinks    Comment:  ETOH abuse for 25 year, 1/2 gallon Liquor daily. admits 6 weeks course of drinking ETOH as her only intake in 2011. she states that she quits her drinnking since Feb 2012.  . Drug use: No    Comment: pots before, clean since 1995  . Sexual activity: Never  Lifestyle  . Physical activity:    Days per week: Not on file    Minutes per session: Not on file  . Stress: Not on file  Relationships  . Social connections:    Talks on phone: Not on file    Gets together: Not on file    Attends religious service: Not on file    Active member of club or organization: Not on file    Attends meetings of clubs or organizations: Not on file    Relationship status: Not on file  . Intimate partner violence:    Fear of current or ex partner: Not on file    Emotionally abused: Not on file    Physically abused: Not on file     Forced sexual activity: Not on file  Other Topics Concern  . Not on file  Social History Narrative   She lives with his stepson in [place]. Moved from [place] 2 years ago. Both  Parents lives in [place]. 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 Did not complete High School .       She admits only 2-3 hours daily for 15 years. She does not sleep at nighttime. She would watch TV and read at night time. Always sleep during the day. She has seen a psychiatrist at [facility] and still goes to the group therapy on Mondays. She has an follow up appt with her Psychiatrist on 08/07/12.     Physical Exam  Vital Signs and Nursing Notes reviewed Vitals:   11/20/18 1429  BP: (!) 150/86  Pulse: (!) 103  Resp: 16  Temp: 98 F (36.7 C)  SpO2: 100%    CONSTITUTIONAL: Well-appearing, NAD NEURO:  Alert and oriented x 3, normal and symmetric strength, 2+ patellar reflexes, decreased sensation to bilateral feet with moderate loss of two-point discrimination EYES:  eyes equal and reactive ENT/NECK:  no LAD, no JVD CARDIO: Regular rate, well-perfused, normal S1 and S2 PULM:  CTAB no wheezing or rhonchi GI/GU:  normal bowel sounds, non-distended, non-tender MSK/SPINE:  No gross deformities, no edema SKIN:  no rash, atraumatic PSYCH:  Appropriate speech and behavior  Diagnostic and Interventional Summary    EKG Interpretation  Date/Time:    Ventricular Rate:    PR Interval:    QRS Duration:   QT Interval:    QTC Calculation:   R Axis:     Text Interpretation:        Labs Reviewed  BASIC METABOLIC PANEL - Abnormal; Notable for the following components:      Result Value   Potassium 3.4 (*)    Calcium 8.5 (*)    All other components within normal limits  CBC - Abnormal; Notable for the following components:   RBC 2.80 (*)    Hemoglobin 9.5 (*)    HCT 29.7 (*)    MCV 106.1 (*)    RDW 15.9 (*)    All other components within  normal limits    No orders to display    Medications - No data to display   Procedures Critical Care  ED Course and Medical Decision Making  I have reviewed  the triage vital signs and the nursing notes.  Pertinent labs & imaging results that were available during my care of the patient were reviewed by me and considered in my medical decision making (see below for details).  Patient with records from GI specialist, was told to come here for Guillain-Barr work-up and lumbar puncture, sounds like the GI specialist spoke with Dr. Raliegh Ip of neurology.  Patient does not exhibit any motor weakness and has brisk bilateral patellar reflexes.  Stocking distribution of decreased sensation more favored to be due to peripheral neuropathy, possibly related to patient's remote history of alcohol use.  Patient with no bowel or bladder dysfunction to suggest cauda equina.  Will discuss findings with in-house neurology to determine need for lumbar puncture.  Discussed case with Dr. Cheral Marker of neurology.  He agrees that with no motor loss and preservation of good patellar reflexes, likelihood of Guyon Barr syndrome is very low.  And this risk likely exceeds the risks associated with lumbar puncture, which include nerve damage, bleeding, infection.  And with no bowel or bladder dysfunction and again with no motor involvement, myelopathy is also likely excluded.  Patient would most benefit from neurology follow-up as an outpatient.  Strict return precautions provided.  After the discussed management above, the patient was determined to be safe for discharge.  The patient was in agreement with this plan and all questions regarding their care were answered.  ED return precautions were discussed and the patient will return to the ED with any significant worsening of condition.  Barth Kirks. Sedonia Small, [place] [email or web address]  Final Clinical Impressions(s) / ED  Diagnoses     ICD-10-CM   1. Idiopathic peripheral neuropathy G60.9     ED Discharge Orders    None         Maudie Flakes, MD 11/20/18 [phone]

## 2018-11-20 NOTE — Discharge Instructions (Addendum)
You were evaluated in the Emergency Department and after careful evaluation, we did not find any emergent condition requiring admission or further testing in the hospital.  Your symptoms today seem to be due to peripheral neuropathy.  Please follow-up with [facility] neurology for further evaluation.  If you experience worsening numbness or any weakness, please return to the emergency department for repeat evaluation.  Please return to the Emergency Department if you experience any worsening of your condition.  We encourage you to follow up with a primary care provider.  Thank you for allowing [place] to be a part of your care.

## 2018-11-23 ENCOUNTER — Ambulatory Visit: Payer: Medicare HMO | Admitting: Neurology

## 2018-11-23 ENCOUNTER — Encounter: Payer: Self-pay | Admitting: Neurology

## 2018-11-23 VITALS — BP 126/70 | HR 78 | Ht 66.0 in | Wt 119.0 lb

## 2018-11-23 DIAGNOSIS — R202 Paresthesia of skin: Secondary | ICD-10-CM | POA: Diagnosis not present

## 2018-11-23 DIAGNOSIS — G609 Hereditary and idiopathic neuropathy, unspecified: Secondary | ICD-10-CM | POA: Diagnosis not present

## 2018-11-23 DIAGNOSIS — E538 Deficiency of other specified B group vitamins: Secondary | ICD-10-CM | POA: Diagnosis not present

## 2018-11-23 NOTE — Progress Notes (Signed)
Reason for visit: Paresthesias of the feet  Referring physician: Clayton  Monique Perry is a 62 y.o. female  History of present illness:  Monique Perry is a 62 year old right-handed white female with a history of onset of numbness in the feet that began about 10 days prior to this evaluation.  The patient has noted some spread of the numbness to include the entire foot and the lower leg over the last several days.  The patient went to the emergency room on 20 November 2018 for an evaluation.  Blood work was done, it was felt that the patient had retained reflexes in the arms and knees, so that she did not have Guillian-Barr syndrome.  The patient was discharged home.  She has noted some mild gait instability, she has a history of chronic low back pain.  She denies any pain radiating down the legs.  She has a cold sensation in the feet.  She denies any involvement of numbness or weakness in the hands.  She denies significant neck pain.  She denies problems controlling the bowels or the bladder.  She has had a recent bout of colitis, she was treated with Flagyl and Cipro.  She was on Flagyl for about 10 days.  She does smoke cigarettes, she denies any other drug use such as cocaine, marijuana, or nitrous oxide.  She comes to this office for an evaluation.  Past Medical History:  Diagnosis Date  . Alcohol abuse last February 2012  . Arthritis   . Bipolar disorder ([place])   . Collagenous colitis   . Drug abuse ([place]) last 1990's    Past Surgical History:  Procedure Laterality Date  . ABDOMINAL HYSTERECTOMY  Age 2 years ago   partial   . FOOT SURGERY    . IR GENERIC HISTORICAL  07/16/2016   IR US GUIDE VASC ACCESS RIGHT 07/16/2016 Arne Cleveland, MD [facility]  . IR GENERIC HISTORICAL  07/16/2016   IR ANGIOGRAM VISCERAL SELECTIVE 07/16/2016 Arne Cleveland, MD [facility]  . IR GENERIC HISTORICAL  07/16/2016   IR ANGIOGRAM SELECTIVE EACH ADDITIONAL VESSEL 07/16/2016 Arne Cleveland, MD [facility]   . IR GENERIC HISTORICAL  07/16/2016   IR EMBO ART  VEN HEMORR LYMPH EXTRAV  INC GUIDE ROADMAPPING 07/16/2016 Arne Cleveland, MD [facility]  . IR GENERIC HISTORICAL  07/16/2016   IR ANGIOGRAM FOLLOW UP STUDY 07/16/2016 Arne Cleveland, MD [facility]  . Lapidus fusion Right 04/17/2012  . NOSE SURGERY    . OSTEOTOMY Right 04/17/2012   Rt #2 Metatarsal  . TONSILLECTOMY  As a child   . WRIST SURGERY      Family History  Problem Relation Age of Onset  . Stroke Mother   . Diabetes Father   . Hypertension Father   . Hyperlipidemia Father   . Breast cancer Sister     Social history:  reports that she has been smoking cigarettes and cigars. She has a 33.75 pack-year smoking history. She has never used smokeless tobacco. She reports current alcohol use. She reports that she does not use drugs.  Medications:  Prior to Admission medications   Medication Sig Start Date End Date Taking? Authorizing Provider  diphenoxylate-atropine (LOMOTIL) 2.5-0.025 MG tablet Take 1-2 tablets by mouth See admin instructions. Take 2 tablets by mouth daily as needed for diarrhea/loose stool, may repeat in one hour if still needed.   Yes [provider]  ferrous sulfate 325 (65 FE) MG tablet Take 325 mg by  mouth at bedtime. 11/03/18  Yes [provider]  folic acid (FOLVITE) 1 MG tablet Take 1 mg by mouth daily. 11/03/18  Yes [provider]  Polyvinyl Alcohol-Povidone (REFRESH OP) Place 1 drop into both eyes daily as needed (dry eyes).   Yes [provider]  potassium chloride 20 MEQ TBCR Take 20 mEq by mouth daily. 01/13/17  Yes Hedges, Dellis Filbert, PA-C  vitamin B-12 (CYANOCOBALAMIN) 100 MCG tablet Take 100 mcg by mouth daily. 11/06/18  Yes [provider]      Allergies  Allergen Reactions  . Erythrocin Other (See Comments)    Thrush  . Sulfa Antibiotics Other (See Comments)    Thrush   . Trazodone And Nefazodone Other (See Comments)    Severe insomnia  . Tramadol  Itching    ROS:  Out of a complete 14 system review of symptoms, the patient complains only of the following symptoms, and all other reviewed systems are negative.  Heart murmur Anemia, easy bruising Joint pain Numbness Insomnia  Blood pressure 126/70, pulse 78, height 5\' 6"  (1.676 m), weight 119 lb (54 kg).  Physical Exam  General: The patient is alert and cooperative at the time of the examination.  Eyes: Pupils are equal, round, and reactive to light. Discs are flat bilaterally.  Neck: The neck is supple, no carotid bruits are noted.  Respiratory: The respiratory examination is clear.  Cardiovascular: The cardiovascular examination reveals a regular rate and rhythm, no obvious murmurs or rubs are noted.  Skin: Extremities are without significant edema.  Neurologic Exam  Mental status: The patient is alert and oriented x 3 at the time of the examination. The patient has apparent normal recent and remote memory, with an apparently normal attention span and concentration ability.  Cranial nerves: Facial symmetry is present. There is good sensation of the face to pinprick and soft touch bilaterally. The strength of the facial muscles and the muscles to head turning and shoulder shrug are normal bilaterally. Speech is well enunciated, no aphasia or dysarthria is noted. Extraocular movements are full. Visual fields are full. The tongue is midline, and the patient has symmetric elevation of the soft palate. No obvious hearing deficits are noted.  Motor: The motor testing reveals 5 over 5 strength of all 4 extremities, with exception of 4/5 strength with hip flexion bilaterally.  Good symmetric motor tone is noted throughout.  Sensory: Sensory testing is intact to pinprick, soft touch, vibration sensation, and position sense on the upper extremities.  With the lower extremities there is a stocking pattern pinprick sensory deficit in the distal two thirds of the lower extremities below  the knees.  Vibration sensation and position sensation are impaired in both feet.  No evidence of extinction is noted.  Coordination: Cerebellar testing reveals good finger-nose-finger and heel-to-shin bilaterally.  Gait and station: Gait is normal. Tandem gait is slightly unsteady.  Romberg is negative. No drift is seen.  The patient is able to walk on the toes bilaterally, she has difficulty walking on the heels on the right, can perform on the left.  Reflexes: Deep tendon reflexes are symmetric and normal bilaterally, with exception of depression of the ankle jerk reflexes bilaterally.  Toes are downgoing bilaterally.   Assessment/Plan:  1.  Peripheral neuropathy  The patient has had a subacute onset of peripheral neuropathy, she has well-maintained reflexes at the knees and in the arms.  She likely does not have a demyelinating neuropathy associated with Guillian-Barre syndrome.  The patient  has had a CBC with a significantly elevated MCV, we will check blood work to include B12 level.  She will be set up for nerve conduction studies on both legs and EMG on the right leg.  She will follow-up with above study, otherwise she will be seen in about 4 months.   Jill Alexanders MD 11/23/2018 3:42 PM  [facility] [address] [place], [place] [address]  Phone [phone] Fax [phone]

## 2018-11-28 ENCOUNTER — Telehealth: Payer: Self-pay | Admitting: Neurology

## 2018-11-28 LAB — MULTIPLE MYELOMA PANEL, SERUM
ALBUMIN SERPL ELPH-MCNC: 2.9 g/dL (ref 2.9–4.4)
Albumin/Glob SerPl: 1.1 (ref 0.7–1.7)
Alpha 1: 0.3 g/dL (ref 0.0–0.4)
Alpha2 Glob SerPl Elph-Mcnc: 0.9 g/dL (ref 0.4–1.0)
B-Globulin SerPl Elph-Mcnc: 0.9 g/dL (ref 0.7–1.3)
Gamma Glob SerPl Elph-Mcnc: 0.8 g/dL (ref 0.4–1.8)
Globulin, Total: 2.9 g/dL (ref 2.2–3.9)
IgA/Immunoglobulin A, Serum: 188 mg/dL (ref 87–352)
IgG (Immunoglobin G), Serum: 762 mg/dL (ref 700–1600)
IgM (Immunoglobulin M), Srm: 204 mg/dL (ref 26–217)
Total Protein: 5.8 g/dL — ABNORMAL LOW (ref 6.0–8.5)

## 2018-11-28 LAB — ANGIOTENSIN CONVERTING ENZYME: Angio Convert Enzyme: 43 U/L (ref 14–82)

## 2018-11-28 LAB — B. BURGDORFI ANTIBODIES: Lyme IgG/IgM Ab: <0.91 {ISR} (ref 0.00–0.90)

## 2018-11-28 LAB — RHEUMATOID FACTOR: Rhuematoid fact SerPl-aCnc: <10 IU/mL (ref 0.0–13.9)

## 2018-11-28 LAB — HIV ANTIBODY (ROUTINE TESTING W REFLEX): HIV Screen 4th Generation wRfx: NONREACTIVE

## 2018-11-28 LAB — SEDIMENTATION RATE: Sed Rate: 43 mm/hr — ABNORMAL HIGH (ref 0–40)

## 2018-11-28 LAB — ENA+DNA/DS+SJORGEN'S
ENA RNP Ab: <0.2 AI (ref 0.0–0.9)
ENA SM Ab Ser-aCnc: <0.2 AI (ref 0.0–0.9)
ENA SSA (RO) Ab: <0.2 AI (ref 0.0–0.9)
ENA SSB (LA) Ab: 1.4 AI — ABNORMAL HIGH (ref 0.0–0.9)

## 2018-11-28 LAB — ANA W/REFLEX: ANA: POSITIVE — AB

## 2018-11-28 LAB — VITAMIN B12: Vitamin B-12: 629 pg/mL (ref 232–1245)

## 2018-11-28 LAB — COPPER, SERUM: Copper: 79 ug/dL (ref 72–166)

## 2018-11-28 LAB — RPR: RPR Ser Ql: NONREACTIVE

## 2018-11-28 NOTE — Telephone Encounter (Signed)
I called the patient.  The blood work was relatively unremarkable with exception of a very minimally elevated reason sedimentation rate, ANA was positive, only the SSB antibody was minimally elevated. The patient will be here tomorrow for the EMG study.

## 2018-11-29 ENCOUNTER — Encounter: Payer: Self-pay | Admitting: Neurology

## 2018-11-29 ENCOUNTER — Ambulatory Visit (INDEPENDENT_AMBULATORY_CARE_PROVIDER_SITE_OTHER): Payer: Medicare HMO | Admitting: Neurology

## 2018-11-29 ENCOUNTER — Ambulatory Visit: Payer: Medicare HMO | Admitting: Neurology

## 2018-11-29 DIAGNOSIS — G603 Idiopathic progressive neuropathy: Secondary | ICD-10-CM

## 2018-11-29 DIAGNOSIS — G609 Hereditary and idiopathic neuropathy, unspecified: Secondary | ICD-10-CM

## 2018-11-29 DIAGNOSIS — G629 Polyneuropathy, unspecified: Secondary | ICD-10-CM

## 2018-11-29 HISTORY — DX: Polyneuropathy, unspecified: G62.9

## 2018-11-29 MED ORDER — GABAPENTIN 300 MG PO CAPS: 300.0000 mg | ORAL_CAPSULE | Freq: Every day | ORAL | 3 refills | Status: DC

## 2018-11-29 NOTE — Procedures (Signed)
     HISTORY:  Monique Perry is a 62 year old patient with reported recent onset of numbness in the feet and cold sensations.  The patient is being evaluated for a possible peripheral neuropathy versus a lumbosacral radiculopathy.  NERVE CONDUCTION STUDIES:  Nerve conduction studies were performed on the right upper extremity.  The distal motor latency for the right ulnar nerve was normal with a slightly low motor amplitude.  The nerve conduction velocities for the right ulnar nerve were normal.  The sensory latency for the right radial nerve was slightly prolonged, but was normal for the right ulnar nerve.   Nerve conduction studies were performed on both lower extremities.  The distal motor latency for the right peroneal nerve was prolonged with a low motor amplitude.  The distal motor latency for the left peroneal nerve was normal with a low motor amplitude.  Slowing was seen above and below the knee for the right peroneal nerve, above the knee only for the left peroneal nerve.  The distal motor latencies and motor amplitudes for the posterior tibial nerves were normal bilaterally with normal nerve conduction velocity seen for these nerves.  The sensory latencies for the sural nerves were normal bilaterally, unobtainable for the right peroneal nerve and left peroneal nerve.  The F-wave latencies for the posterior tibial nerves were within normal limits bilaterally.  EMG STUDIES:  EMG study was performed on the right lower extremity:  The tibialis anterior muscle reveals 2 to 4K motor units with slightly reduced recruitment. No fibrillations or positive waves were seen. The peroneus tertius muscle reveals 2 to 4K motor units with reduced recruitment. No fibrillations or positive waves were seen. The medial gastrocnemius muscle reveals 1 to 3K motor units with reduced recruitment. 1+ positive waves were seen. The vastus lateralis muscle reveals 2 to 4K motor units with full recruitment. No  fibrillations or positive waves were seen. The iliopsoas muscle reveals 2 to 4K motor units with full recruitment. No fibrillations or positive waves were seen. The biceps femoris muscle (long head) reveals 2 to 4K motor units with full recruitment. No fibrillations or positive waves were seen. The lumbosacral paraspinal muscles were tested at 3 levels, and revealed no abnormalities of insertional activity at all 3 levels tested. There was good relaxation.   IMPRESSION:  Nerve conduction studies done on the right upper extremity and both lower extremities show evidence of a peripheral neuropathy of mild to moderate severity.  EMG evaluation of the right lower extremity shows mild chronic and acute changes below the knee, no evidence of an overlying lumbosacral radiculopathy.  Jill Alexanders MD 11/29/2018 2:48 PM  [facility] [address] [place], [place] [address]  Phone [phone] Fax [phone]

## 2018-11-29 NOTE — Progress Notes (Signed)
The patient comes in today for EMG nerve conduction study evaluation.  Nerve conduction studies do show a primarily axonal peripheral neuropathy of moderate severity.  EMG evaluation of the right leg does show some mild primarily chronic changes below the knee.  No evidence of a lumbosacral radiculopathy is seen.  Patient does report some discomfort in the feet at night, cold sensations.  I will call in some gabapentin, 300 mg at night.

## 2018-11-29 NOTE — Progress Notes (Signed)
Please refer to EMG and nerve conduction procedure note.  

## 2018-12-21 ENCOUNTER — Ambulatory Visit: Payer: Medicare HMO | Admitting: Neurology

## 2018-12-21 ENCOUNTER — Encounter: Payer: Self-pay | Admitting: Neurology

## 2018-12-21 VITALS — BP 136/84 | HR 67 | Ht 66.0 in | Wt 117.3 lb

## 2018-12-21 DIAGNOSIS — G603 Idiopathic progressive neuropathy: Secondary | ICD-10-CM | POA: Diagnosis not present

## 2018-12-21 DIAGNOSIS — R269 Unspecified abnormalities of gait and mobility: Secondary | ICD-10-CM

## 2018-12-21 MED ORDER — VITAMIN B-1 100 MG PO TABS: 100.0000 mg | ORAL_TABLET | Freq: Every day | ORAL | 3 refills | Status: DC

## 2018-12-21 MED ORDER — GABAPENTIN 600 MG PO TABS: 600.0000 mg | ORAL_TABLET | Freq: Three times a day (TID) | ORAL | 3 refills | Status: DC

## 2018-12-21 NOTE — Patient Instructions (Signed)
We will go up on the gabapentin to 600 mg three times a day. Call for any dose adjustments.  Neurontin (gabapentin) may result in drowsiness, ankle swelling, gait instability, or possibly dizziness. Please contact our office if significant side effects occur with this medication.

## 2018-12-21 NOTE — Progress Notes (Signed)
Reason for visit: Peripheral neuropathy  Monique Perry is an 63 y.o. female  History of present illness:  Monique Perry is a 63 year old right-handed white female with a history of prior alcohol abuse and a peripheral neuropathy.  The patient claims that she has not had any alcohol intake in 2 years, she however continues to have anemia associated with a very high MCV.  She has developed a peripheral neuropathy that she claims was subacute in onset, she was on metronidazole for 10 days before the onset of the neuropathy.  The patient has been on gabapentin taking 300 mg 3 times daily, she is still having severe discomfort in the feet primarily.  EMG and nerve conduction studies confirm the presence of a peripheral neuropathy.  The patient returns for an evaluation.  The patient claimed that she is not sleeping well because of the neuropathy.  Past Medical History:  Diagnosis Date  . Alcohol abuse last February 2012  . Arthritis   . Bipolar disorder ([place])   . Collagenous colitis   . Drug abuse ([place]) last 1990's  . Peripheral neuropathy 11/29/2018    Past Surgical History:  Procedure Laterality Date  . ABDOMINAL HYSTERECTOMY  Age 61 years ago   partial   . FOOT SURGERY    . IR GENERIC HISTORICAL  07/16/2016   IR US GUIDE VASC ACCESS RIGHT 07/16/2016 Arne Cleveland, MD [facility]  . IR GENERIC HISTORICAL  07/16/2016   IR ANGIOGRAM VISCERAL SELECTIVE 07/16/2016 Arne Cleveland, MD [facility]  . IR GENERIC HISTORICAL  07/16/2016   IR ANGIOGRAM SELECTIVE EACH ADDITIONAL VESSEL 07/16/2016 Arne Cleveland, MD [facility]  . IR GENERIC HISTORICAL  07/16/2016   IR EMBO ART  VEN HEMORR LYMPH EXTRAV  INC GUIDE ROADMAPPING 07/16/2016 Arne Cleveland, MD [facility]  . IR GENERIC HISTORICAL  07/16/2016   IR ANGIOGRAM FOLLOW UP STUDY 07/16/2016 Arne Cleveland, MD [facility]  . Lapidus fusion Right 04/17/2012  . NOSE SURGERY    . OSTEOTOMY Right 04/17/2012   Rt #2 Metatarsal  . TONSILLECTOMY  As a child    . WRIST SURGERY      Family History  Problem Relation Age of Onset  . Stroke Mother   . Diabetes Father   . Hypertension Father   . Hyperlipidemia Father   . Breast cancer Sister     Social history:  reports that she has been smoking cigarettes and cigars. She has a 33.75 pack-year smoking history. She has never used smokeless tobacco. She reports current alcohol use. She reports that she does not use drugs.    Allergies  Allergen Reactions  . Erythrocin Other (See Comments)    Thrush  . Sulfa Antibiotics Other (See Comments)    Thrush   . Trazodone And Nefazodone Other (See Comments)    Severe insomnia  . Tramadol Itching    Medications:  Prior to Admission medications   Medication Sig Start Date End Date Taking? Authorizing Provider  diphenoxylate-atropine (LOMOTIL) 2.5-0.025 MG tablet Take 1-2 tablets by mouth See admin instructions. Take 2 tablets by mouth daily as needed for diarrhea/loose stool, may repeat in one hour if still needed.   Yes [provider]  ferrous sulfate 325 (65 FE) MG tablet Take 325 mg by mouth at bedtime. 11/03/18  Yes [provider]  folic acid (FOLVITE) 1 MG tablet Take 1 mg by mouth daily. 11/03/18  Yes [provider]  gabapentin (NEURONTIN) 300 MG capsule Take 1 capsule (300  mg total) by mouth at bedtime. 11/29/18  Yes Kathrynn Ducking, MD  Polyvinyl Alcohol-Povidone (REFRESH OP) Place 1 drop into both eyes daily as needed (dry eyes).   Yes [provider]  vitamin B-12 (CYANOCOBALAMIN) 100 MCG tablet Take 100 mcg by mouth daily. 11/06/18  Yes [provider]    ROS:  Out of a complete 14 system review of symptoms, the patient complains only of the following symptoms, and all other reviewed systems are negative.  Insomnia, frequent waking Joint pain, back pain, walking difficulty Numbness  Blood pressure 136/84, pulse 67, height 5\' 6"  (1.676 m), weight 117 lb 5 oz (53.2 kg).  Physical  Exam  General: The patient is alert and cooperative at the time of the examination.  Skin: No significant peripheral edema is noted.   Neurologic Exam  Mental status: The patient is alert and oriented x 3 at the time of the examination. The patient has apparent normal recent and remote memory, with an apparently normal attention span and concentration ability.   Cranial nerves: Facial symmetry is present. Speech is normal, no aphasia or dysarthria is noted. Extraocular movements are full. Visual fields are full.  Motor: The patient has good strength in all 4 extremities, with exception of bilateral foot drops.  Sensory examination: Soft touch sensation is symmetric on the face, arms, and legs.  Coordination: The patient has good finger-nose-finger and heel-to-shin bilaterally.  Gait and station: The patient has a normal gait. Tandem gait is minimally unsteady. Romberg is negative. No drift is seen.  Reflexes: Deep tendon reflexes are symmetric, reflexes are normal with exception that the ankle jerk reflexes are depressed bilaterally.   Assessment/Plan:  1.  Peripheral neuropathy  2.  History of alcohol abuse  3.  Bilateral foot drops  The patient claims that she is not drinking alcohol currently, blood work otherwise has not shown an etiology of her peripheral neuropathy.  The patient will be increased on the gabapentin taking 600 mg 3 times daily, she will call for any dose adjustments.  She will follow-up for next scheduled evaluation in April.  She was given a prescription for thiamine, she is already on folic acid and vitamin B12.  Jill Alexanders MD 12/21/2018 7:53 AM  [facility] [address] [place], [place] [address]  Phone [phone] Fax [phone]

## 2019-01-01 DIAGNOSIS — R161 Splenomegaly, not elsewhere classified: Secondary | ICD-10-CM | POA: Diagnosis not present

## 2019-01-01 DIAGNOSIS — Z681 Body mass index (BMI) 19 or less, adult: Secondary | ICD-10-CM | POA: Diagnosis not present

## 2019-01-01 DIAGNOSIS — G629 Polyneuropathy, unspecified: Secondary | ICD-10-CM | POA: Diagnosis not present

## 2019-01-01 DIAGNOSIS — R899 Unspecified abnormal finding in specimens from other organs, systems and tissues: Secondary | ICD-10-CM | POA: Diagnosis not present

## 2019-01-01 DIAGNOSIS — D649 Anemia, unspecified: Secondary | ICD-10-CM | POA: Diagnosis not present

## 2019-01-01 DIAGNOSIS — Z9081 Acquired absence of spleen: Secondary | ICD-10-CM | POA: Diagnosis not present

## 2019-01-08 ENCOUNTER — Emergency Department (HOSPITAL_COMMUNITY)
Admission: EM | Admit: 2019-01-08 | Discharge: 2019-01-08 | Disposition: A | Discharge status: Home / Self Care | Payer: Medicare HMO | Attending: Emergency Medicine | Admitting: Emergency Medicine

## 2019-01-08 ENCOUNTER — Other Ambulatory Visit: Payer: Self-pay

## 2019-01-08 DIAGNOSIS — Z79899 Other long term (current) drug therapy: Secondary | ICD-10-CM | POA: Diagnosis not present

## 2019-01-08 DIAGNOSIS — F17210 Nicotine dependence, cigarettes, uncomplicated: Secondary | ICD-10-CM | POA: Insufficient documentation

## 2019-01-08 DIAGNOSIS — R05 Cough: Secondary | ICD-10-CM | POA: Diagnosis not present

## 2019-01-08 DIAGNOSIS — R197 Diarrhea, unspecified: Secondary | ICD-10-CM | POA: Insufficient documentation

## 2019-01-08 DIAGNOSIS — R0902 Hypoxemia: Secondary | ICD-10-CM | POA: Diagnosis not present

## 2019-01-08 DIAGNOSIS — I10 Essential (primary) hypertension: Secondary | ICD-10-CM | POA: Diagnosis not present

## 2019-01-08 DIAGNOSIS — R112 Nausea with vomiting, unspecified: Secondary | ICD-10-CM | POA: Diagnosis not present

## 2019-01-08 DIAGNOSIS — R202 Paresthesia of skin: Secondary | ICD-10-CM | POA: Diagnosis not present

## 2019-01-08 LAB — CBC
HCT: 38 % (ref 36.0–46.0)
Hemoglobin: 12.6 g/dL (ref 12.0–15.0)
MCH: 34.8 pg — ABNORMAL HIGH (ref 26.0–34.0)
MCHC: 33.2 g/dL (ref 30.0–36.0)
MCV: 105 fL — AB (ref 80.0–100.0)
Platelets: 238 10*3/uL (ref 150–400)
RBC: 3.62 MIL/uL — ABNORMAL LOW (ref 3.87–5.11)
RDW: 15 % (ref 11.5–15.5)
WBC: 7.1 10*3/uL (ref 4.0–10.5)
nRBC: 0 % (ref 0.0–0.2)

## 2019-01-08 LAB — COMPREHENSIVE METABOLIC PANEL
ALT: 15 U/L (ref 0–44)
AST: 26 U/L (ref 15–41)
Albumin: 3.6 g/dL (ref 3.5–5.0)
Alkaline Phosphatase: 65 U/L (ref 38–126)
Anion gap: 17 — ABNORMAL HIGH (ref 5–15)
BUN: 10 mg/dL (ref 8–23)
CO2: 22 mmol/L (ref 22–32)
Calcium: 10 mg/dL (ref 8.9–10.3)
Chloride: 98 mmol/L (ref 98–111)
Creatinine, Ser: 0.92 mg/dL (ref 0.44–1.00)
GFR calc non Af Amer: >60 mL/min (ref >60)
Glucose, Bld: 102 mg/dL — ABNORMAL HIGH (ref 70–99)
Potassium: 3.6 mmol/L (ref 3.5–5.1)
Sodium: 137 mmol/L (ref 135–145)
Total Bilirubin: 1.9 mg/dL — ABNORMAL HIGH (ref 0.3–1.2)
Total Protein: 6.9 g/dL (ref 6.5–8.1)

## 2019-01-08 LAB — LIPASE, BLOOD: Lipase: 30 U/L (ref 11–51)

## 2019-01-08 MED ORDER — SODIUM CHLORIDE 0.9 % IV BOLUS
500.0000 mL | Freq: Once | INTRAVENOUS | Status: AC
  Administered 2019-01-08: 500 mL via INTRAVENOUS

## 2019-01-08 MED ORDER — ONDANSETRON 4 MG PO TBDP: ORAL_TABLET | ORAL | 0 refills | Status: DC

## 2019-01-08 MED ORDER — ONDANSETRON HCL 4 MG/2ML IJ SOLN
4.0000 mg | Freq: Once | INTRAMUSCULAR | Status: AC
  Administered 2019-01-08: 4 mg via INTRAVENOUS
  Filled 2019-01-08: qty 2

## 2019-01-08 MED ORDER — FENTANYL CITRATE (PF) 100 MCG/2ML IJ SOLN
50.0000 ug | INTRAMUSCULAR | Status: DC | PRN
  Administered 2019-01-08: 50 ug via INTRAVENOUS
  Filled 2019-01-08: qty 2

## 2019-01-08 NOTE — ED Triage Notes (Signed)
Pt BIB [facility],  endorses cough with diarrhea x 4 days. Recently placed on cymbalta and recently diagnosed with neuropathy. Ambulatory and axox4. VS 178/98, HR 94, spo2 96% on RA, CBG 116, RR 16.

## 2019-01-08 NOTE — Discharge Instructions (Addendum)
Discuss your medication with your primary doctor and schedule appointment for reassessment later this week.  Use Zofran as needed for persistent nausea and vomiting.  Stay well hydrated with oral fluids.  Return for worsening or new symptoms

## 2019-01-08 NOTE — ED Provider Notes (Signed)
[facility] EMERGENCY DEPARTMENT Provider Note   CSN: [id number] Arrival date & time: 01/08/19  1053     History   Chief Complaint Chief Complaint  Patient presents with  . Diarrhea    HPI Monique Perry is a 63 y.o. female.  Patient with history of bipolar, alcohol abuse presents diarrhea for 4 days.  Patient feels symptoms started after starting Cymbalta.  Patient states Cymbalta for neuropathy.  Patient is seen neurology and has a primary doctor.  No current antibiotics, no blood in the stools, no fevers.  No focal abdominal pain.  Mild cramping.     Past Medical History:  Diagnosis Date  . Alcohol abuse last February 2012  . Arthritis   . Bipolar disorder ([place])   . Collagenous colitis   . Drug abuse ([place]) last 1990's  . Peripheral neuropathy 11/29/2018    Patient Active Problem List   Diagnosis Date Noted  . Peripheral neuropathy 11/29/2018  . Hypercalcemia 01/18/2017  . Splenic rupture 07/15/2016  . Serrated adenoma of cecum s/p polypectomy 2011 07/15/2016  . Neck pain on left side 11/02/2014  . Elevated transaminase level 02/14/2014  . Anemia 11/14/2013  . History of alcoholism ([place]) 11/12/2013  . Hepatitis B vaccination administered at current visit 11/12/2013  . Knee pain 06/18/2013  . Right foot pain s/p fusion surgery of right foot 05/10/2013  . Fracture of metatarsal of right foot, closed 03/20/2013  . Abnormality of gait 03/20/2013  . Low back pain radiating to right leg 02/20/2013  . Chronic back pain 02/08/2013  . Atherosclerotic peripheral vascular disease with intermittent claudication ([place]) 02/08/2013  . Carotid bruit 02/08/2013  . High blood pressure 02/08/2013  . LSIL (low grade squamous intraepithelial lesion) on Pap smear 12/14/2012  . Osteopenia 10/02/2012  . Hyperlipidemia 08/31/2012  . Depression 08/31/2012  . left nose skin lesion 08/31/2012  . Drug abuse, history of   . Bipolar disorder ([place])   . Collagenous  colitis   . Medically noncompliant 07/26/2012  . GERD (gastroesophageal reflux disease) 07/21/2012  . Tobacco abuse 07/21/2012    Past Surgical History:  Procedure Laterality Date  . ABDOMINAL HYSTERECTOMY  Age 45 years ago   partial   . FOOT SURGERY    . IR GENERIC HISTORICAL  07/16/2016   IR US GUIDE VASC ACCESS RIGHT 07/16/2016 Arne [place], MD [facility]  . IR GENERIC HISTORICAL  07/16/2016   IR ANGIOGRAM VISCERAL SELECTIVE 07/16/2016 Arne [place], MD [facility]  . IR GENERIC HISTORICAL  07/16/2016   IR ANGIOGRAM SELECTIVE EACH ADDITIONAL VESSEL 07/16/2016 Arne [place], MD [facility]  . IR GENERIC HISTORICAL  07/16/2016   IR EMBO ART  VEN HEMORR LYMPH EXTRAV  INC GUIDE ROADMAPPING 07/16/2016 Arne [place], MD [facility]  . IR GENERIC HISTORICAL  07/16/2016   IR ANGIOGRAM FOLLOW UP STUDY 07/16/2016 Arne [place], MD [facility]  . [place] fusion Right 04/17/2012  . NOSE SURGERY    . OSTEOTOMY Right 04/17/2012   Rt #2 Metatarsal  . TONSILLECTOMY  As a child   . WRIST SURGERY       OB History    Gravida  3   Para  0   Term      Preterm      AB  3   Living  0     SAB  2   TAB  1   Ectopic  0   Multiple  0   Live Births  Obstetric Comments  Miscarriage due to drug abuse.         Home Medications    Prior to Admission medications   Medication Sig Start Date End Date Taking? Authorizing Provider  diphenoxylate-atropine (LOMOTIL) 2.5-0.025 MG tablet Take 1-2 tablets by mouth See admin instructions. Take 2 tablets by mouth daily as needed for diarrhea/loose stool, may repeat in one hour if still needed.    [provider]  ferrous sulfate 325 (65 FE) MG tablet Take 325 mg by mouth at bedtime. 11/03/18   [provider]  folic acid (FOLVITE) 1 MG tablet Take 1 mg by mouth daily. 11/03/18   [provider]  gabapentin (NEURONTIN) 600 MG tablet Take 1 tablet (600 mg total) by mouth 3 (three) times daily. 12/21/18   Kathrynn Ducking, MD  Polyvinyl Alcohol-Povidone (REFRESH OP) Place 1 drop into both eyes daily as needed (dry eyes).    [provider]  thiamine (VITAMIN B-1) 100 MG tablet Take 1 tablet (100 mg total) by mouth daily. 12/21/18   Kathrynn Ducking, MD  vitamin B-12 (CYANOCOBALAMIN) 100 MCG tablet Take 100 mcg by mouth daily. 11/06/18   [provider]    Family History Family History  Problem Relation Age of Onset  . Stroke Mother   . Diabetes Father   . Hypertension Father   . Hyperlipidemia Father   . Breast cancer Sister     Social History Social History   Tobacco Use  . Smoking status: Current Some Day Smoker    Packs/day: 0.75    Years: 45.00    Pack years: 33.75    Types: Cigarettes, Cigars  . Smokeless tobacco: Never Used  . Tobacco comment: smoking cig for 35 years. continues to smoke 1/2 pack daily  Substance Use Topics  . Alcohol use: Yes    Alcohol/week: 0.0 standard drinks    Comment: ETOH abuse for 25 year, 1/2 gallon Liquor daily. admits 6 weeks course of drinking ETOH as her only intake in 2011. she states that she quits her drinnking since Feb 2012.  . Drug use: No    Comment: pots before, clean since 1995     Allergies   Erythrocin; Sulfa antibiotics; Trazodone and nefazodone; and Tramadol   Review of Systems Review of Systems  Constitutional: Negative for chills and fever.  HENT: Negative for congestion.   Eyes: Negative for visual disturbance.  Respiratory: Negative for shortness of breath.   Cardiovascular: Negative for chest pain.  Gastrointestinal: Positive for diarrhea, nausea and vomiting. Negative for abdominal pain and blood in stool.  Genitourinary: Negative for dysuria and flank pain.  Musculoskeletal: Negative for back pain, neck pain and neck stiffness.  Skin: Negative for rash.  Neurological: Negative for light-headedness and headaches.     Physical Exam Updated Vital Signs BP (!) 160/74   Pulse 90   Temp 98.2 F  (36.8 C) (Oral)   Resp 18   Ht 5\' 6"  (1.676 m)   Wt 53.1 kg   SpO2 96%   BMI 18.88 kg/m   Physical Exam Vitals signs and nursing note reviewed.  Constitutional:      Appearance: She is well-developed.  HENT:     Head: Normocephalic and atraumatic.  Eyes:     General:        Right eye: No discharge.        Left eye: No discharge.     Conjunctiva/sclera: Conjunctivae normal.  Neck:     Musculoskeletal: Normal  range of motion and neck supple.     Trachea: No tracheal deviation.  Cardiovascular:     Rate and Rhythm: Normal rate and regular rhythm.  Pulmonary:     Effort: Pulmonary effort is normal.     Breath sounds: Normal breath sounds.  Abdominal:     General: There is no distension.     Palpations: Abdomen is soft.     Tenderness: There is abdominal tenderness (mild diffuse). There is no guarding.  Skin:    General: Skin is warm.     Findings: No rash.  Neurological:     Mental Status: She is alert and oriented to person, place, and time.      ED Treatments / Results  Labs (all labs ordered are listed, but only abnormal results are displayed) Labs Reviewed  COMPREHENSIVE METABOLIC PANEL - Abnormal; Notable for the following components:      Result Value   Glucose, Bld 102 (*)    Total Bilirubin 1.9 (*)    Anion gap 17 (*)    All other components within normal limits  CBC - Abnormal; Notable for the following components:   RBC 3.62 (*)    MCV 105.0 (*)    MCH 34.8 (*)    All other components within normal limits  LIPASE, BLOOD    EKG None  Radiology No results found.  Procedures Procedures (including critical care time)  Medications Ordered in ED Medications  fentaNYL (SUBLIMAZE) injection 50 mcg (50 mcg Intravenous Given 01/08/19 1205)  sodium chloride 0.9 % bolus 500 mL (500 mLs Intravenous New Bag/Given 01/08/19 1205)  ondansetron (ZOFRAN) injection 4 mg (4 mg Intravenous Given 01/08/19 1204)     Initial Impression / Assessment and Plan / ED  Course  I have reviewed the triage vital signs and the nursing notes.  Pertinent labs & imaging results that were available during my care of the patient were reviewed by me and considered in my medical decision making (see chart for details).    Well-appearing patient presents with mild vomiting and diarrhea possibly associate with medication versus viral versus other.  No fever, no leukocytosis, no focality to exam.  Blood work ordered and results reviewed reassuring.  Patient stable for close outpatient follow-up with Zofran as needed.  Patient will hold Cymbalta and follow-up with primary doctor.  IV and oral fluids in the ER.  Results and differential diagnosis were discussed with the patient/parent/guardian. Xrays were independently reviewed by myself.  Close follow up outpatient was discussed, comfortable with the plan.   Medications  fentaNYL (SUBLIMAZE) injection 50 mcg (50 mcg Intravenous Given 01/08/19 1205)  sodium chloride 0.9 % bolus 500 mL (500 mLs Intravenous New Bag/Given 01/08/19 1205)  ondansetron (ZOFRAN) injection 4 mg (4 mg Intravenous Given 01/08/19 1204)    Vitals:   01/08/19 1057 01/08/19 1200 01/08/19 1206 01/08/19 1215  BP: (!) 156/89 (!) 149/82  (!) 160/74  Pulse: 90 94  90  Resp: 18   18  Temp: 98.2 F (36.8 C)     TempSrc: Oral     SpO2: 99% 100%  96%  Weight:   53.1 kg   Height:   5\' 6"  (1.676 m)     Final diagnoses:  None     Final Clinical Impressions(s) / ED Diagnoses   Final diagnoses:  None    ED Discharge Orders    None       Elnora Morrison, MD 01/08/19 1327

## 2019-01-12 ENCOUNTER — Other Ambulatory Visit: Payer: Self-pay | Admitting: Neurology

## 2019-01-16 DIAGNOSIS — G894 Chronic pain syndrome: Secondary | ICD-10-CM | POA: Diagnosis not present

## 2019-01-16 DIAGNOSIS — G609 Hereditary and idiopathic neuropathy, unspecified: Secondary | ICD-10-CM | POA: Diagnosis not present

## 2019-02-05 DIAGNOSIS — Z681 Body mass index (BMI) 19 or less, adult: Secondary | ICD-10-CM | POA: Diagnosis not present

## 2019-02-05 DIAGNOSIS — G609 Hereditary and idiopathic neuropathy, unspecified: Secondary | ICD-10-CM | POA: Diagnosis not present

## 2019-02-08 ENCOUNTER — Other Ambulatory Visit: Payer: Self-pay | Admitting: Family Medicine

## 2019-02-08 DIAGNOSIS — Z1231 Encounter for screening mammogram for malignant neoplasm of breast: Secondary | ICD-10-CM

## 2019-03-01 DIAGNOSIS — G4700 Insomnia, unspecified: Secondary | ICD-10-CM | POA: Diagnosis not present

## 2019-03-01 DIAGNOSIS — S51819A Laceration without foreign body of unspecified forearm, initial encounter: Secondary | ICD-10-CM | POA: Diagnosis not present

## 2019-03-02 DIAGNOSIS — L0390 Cellulitis, unspecified: Secondary | ICD-10-CM | POA: Diagnosis not present

## 2019-03-02 DIAGNOSIS — G629 Polyneuropathy, unspecified: Secondary | ICD-10-CM | POA: Diagnosis not present

## 2019-03-02 DIAGNOSIS — S51802A Unspecified open wound of left forearm, initial encounter: Secondary | ICD-10-CM | POA: Diagnosis not present

## 2019-03-06 ENCOUNTER — Ambulatory Visit: Payer: Medicare HMO

## 2019-03-06 DIAGNOSIS — G629 Polyneuropathy, unspecified: Secondary | ICD-10-CM | POA: Diagnosis not present

## 2019-03-06 DIAGNOSIS — G894 Chronic pain syndrome: Secondary | ICD-10-CM | POA: Diagnosis not present

## 2019-03-20 ENCOUNTER — Ambulatory Visit: Payer: Medicare HMO | Admitting: Neurology

## 2019-04-02 ENCOUNTER — Ambulatory Visit: Payer: Medicare HMO

## 2019-05-10 DIAGNOSIS — H2511 Age-related nuclear cataract, right eye: Secondary | ICD-10-CM | POA: Diagnosis not present

## 2019-05-10 DIAGNOSIS — H2513 Age-related nuclear cataract, bilateral: Secondary | ICD-10-CM | POA: Diagnosis not present

## 2019-05-10 DIAGNOSIS — H5211 Myopia, right eye: Secondary | ICD-10-CM | POA: Diagnosis not present

## 2019-05-10 DIAGNOSIS — H538 Other visual disturbances: Secondary | ICD-10-CM | POA: Diagnosis not present

## 2019-05-10 DIAGNOSIS — H5213 Myopia, bilateral: Secondary | ICD-10-CM | POA: Diagnosis not present

## 2019-05-16 DIAGNOSIS — R296 Repeated falls: Secondary | ICD-10-CM | POA: Diagnosis not present

## 2019-05-16 DIAGNOSIS — R634 Abnormal weight loss: Secondary | ICD-10-CM | POA: Diagnosis not present

## 2019-05-16 DIAGNOSIS — E781 Pure hyperglyceridemia: Secondary | ICD-10-CM | POA: Diagnosis not present

## 2019-05-16 DIAGNOSIS — G609 Hereditary and idiopathic neuropathy, unspecified: Secondary | ICD-10-CM | POA: Diagnosis not present

## 2019-05-16 DIAGNOSIS — F319 Bipolar disorder, unspecified: Secondary | ICD-10-CM | POA: Diagnosis not present

## 2019-05-16 DIAGNOSIS — F419 Anxiety disorder, unspecified: Secondary | ICD-10-CM | POA: Diagnosis not present

## 2019-05-16 DIAGNOSIS — Z1159 Encounter for screening for other viral diseases: Secondary | ICD-10-CM | POA: Diagnosis not present

## 2019-05-16 DIAGNOSIS — I700 Atherosclerosis of aorta: Secondary | ICD-10-CM | POA: Diagnosis not present

## 2019-05-16 DIAGNOSIS — E782 Mixed hyperlipidemia: Secondary | ICD-10-CM | POA: Diagnosis not present

## 2019-05-16 DIAGNOSIS — Z0000 Encounter for general adult medical examination without abnormal findings: Secondary | ICD-10-CM | POA: Diagnosis not present

## 2019-05-16 DIAGNOSIS — R630 Anorexia: Secondary | ICD-10-CM | POA: Diagnosis not present

## 2019-05-21 DIAGNOSIS — G894 Chronic pain syndrome: Secondary | ICD-10-CM | POA: Diagnosis not present

## 2019-05-21 DIAGNOSIS — D129 Benign neoplasm of anus and anal canal: Secondary | ICD-10-CM | POA: Diagnosis not present

## 2019-05-21 DIAGNOSIS — R630 Anorexia: Secondary | ICD-10-CM | POA: Diagnosis not present

## 2019-05-21 DIAGNOSIS — F319 Bipolar disorder, unspecified: Secondary | ICD-10-CM | POA: Diagnosis not present

## 2019-05-21 DIAGNOSIS — I700 Atherosclerosis of aorta: Secondary | ICD-10-CM | POA: Diagnosis not present

## 2019-05-21 DIAGNOSIS — M5136 Other intervertebral disc degeneration, lumbar region: Secondary | ICD-10-CM | POA: Diagnosis not present

## 2019-05-21 DIAGNOSIS — E782 Mixed hyperlipidemia: Secondary | ICD-10-CM | POA: Diagnosis not present

## 2019-05-21 DIAGNOSIS — G609 Hereditary and idiopathic neuropathy, unspecified: Secondary | ICD-10-CM | POA: Diagnosis not present

## 2019-05-21 DIAGNOSIS — F419 Anxiety disorder, unspecified: Secondary | ICD-10-CM | POA: Diagnosis not present

## 2019-05-25 DIAGNOSIS — R630 Anorexia: Secondary | ICD-10-CM | POA: Diagnosis not present

## 2019-05-25 DIAGNOSIS — M5136 Other intervertebral disc degeneration, lumbar region: Secondary | ICD-10-CM | POA: Diagnosis not present

## 2019-05-25 DIAGNOSIS — F419 Anxiety disorder, unspecified: Secondary | ICD-10-CM | POA: Diagnosis not present

## 2019-05-25 DIAGNOSIS — E782 Mixed hyperlipidemia: Secondary | ICD-10-CM | POA: Diagnosis not present

## 2019-05-25 DIAGNOSIS — F319 Bipolar disorder, unspecified: Secondary | ICD-10-CM | POA: Diagnosis not present

## 2019-05-25 DIAGNOSIS — D129 Benign neoplasm of anus and anal canal: Secondary | ICD-10-CM | POA: Diagnosis not present

## 2019-05-25 DIAGNOSIS — G894 Chronic pain syndrome: Secondary | ICD-10-CM | POA: Diagnosis not present

## 2019-05-25 DIAGNOSIS — I700 Atherosclerosis of aorta: Secondary | ICD-10-CM | POA: Diagnosis not present

## 2019-05-25 DIAGNOSIS — G609 Hereditary and idiopathic neuropathy, unspecified: Secondary | ICD-10-CM | POA: Diagnosis not present

## 2019-05-28 DIAGNOSIS — I700 Atherosclerosis of aorta: Secondary | ICD-10-CM | POA: Diagnosis not present

## 2019-05-28 DIAGNOSIS — G609 Hereditary and idiopathic neuropathy, unspecified: Secondary | ICD-10-CM | POA: Diagnosis not present

## 2019-05-28 DIAGNOSIS — E782 Mixed hyperlipidemia: Secondary | ICD-10-CM | POA: Diagnosis not present

## 2019-05-28 DIAGNOSIS — F319 Bipolar disorder, unspecified: Secondary | ICD-10-CM | POA: Diagnosis not present

## 2019-05-28 DIAGNOSIS — R630 Anorexia: Secondary | ICD-10-CM | POA: Diagnosis not present

## 2019-05-28 DIAGNOSIS — G894 Chronic pain syndrome: Secondary | ICD-10-CM | POA: Diagnosis not present

## 2019-05-28 DIAGNOSIS — F419 Anxiety disorder, unspecified: Secondary | ICD-10-CM | POA: Diagnosis not present

## 2019-05-28 DIAGNOSIS — D129 Benign neoplasm of anus and anal canal: Secondary | ICD-10-CM | POA: Diagnosis not present

## 2019-05-28 DIAGNOSIS — M5136 Other intervertebral disc degeneration, lumbar region: Secondary | ICD-10-CM | POA: Diagnosis not present

## 2019-05-30 DIAGNOSIS — M5136 Other intervertebral disc degeneration, lumbar region: Secondary | ICD-10-CM | POA: Diagnosis not present

## 2019-05-30 DIAGNOSIS — R630 Anorexia: Secondary | ICD-10-CM | POA: Diagnosis not present

## 2019-05-30 DIAGNOSIS — F419 Anxiety disorder, unspecified: Secondary | ICD-10-CM | POA: Diagnosis not present

## 2019-05-30 DIAGNOSIS — G609 Hereditary and idiopathic neuropathy, unspecified: Secondary | ICD-10-CM | POA: Diagnosis not present

## 2019-05-30 DIAGNOSIS — G894 Chronic pain syndrome: Secondary | ICD-10-CM | POA: Diagnosis not present

## 2019-05-30 DIAGNOSIS — D129 Benign neoplasm of anus and anal canal: Secondary | ICD-10-CM | POA: Diagnosis not present

## 2019-05-30 DIAGNOSIS — I700 Atherosclerosis of aorta: Secondary | ICD-10-CM | POA: Diagnosis not present

## 2019-05-30 DIAGNOSIS — F319 Bipolar disorder, unspecified: Secondary | ICD-10-CM | POA: Diagnosis not present

## 2019-05-30 DIAGNOSIS — E782 Mixed hyperlipidemia: Secondary | ICD-10-CM | POA: Diagnosis not present

## 2019-05-31 DIAGNOSIS — E782 Mixed hyperlipidemia: Secondary | ICD-10-CM | POA: Diagnosis not present

## 2019-05-31 DIAGNOSIS — D129 Benign neoplasm of anus and anal canal: Secondary | ICD-10-CM | POA: Diagnosis not present

## 2019-05-31 DIAGNOSIS — I700 Atherosclerosis of aorta: Secondary | ICD-10-CM | POA: Diagnosis not present

## 2019-05-31 DIAGNOSIS — G894 Chronic pain syndrome: Secondary | ICD-10-CM | POA: Diagnosis not present

## 2019-05-31 DIAGNOSIS — F319 Bipolar disorder, unspecified: Secondary | ICD-10-CM | POA: Diagnosis not present

## 2019-05-31 DIAGNOSIS — M5136 Other intervertebral disc degeneration, lumbar region: Secondary | ICD-10-CM | POA: Diagnosis not present

## 2019-05-31 DIAGNOSIS — R630 Anorexia: Secondary | ICD-10-CM | POA: Diagnosis not present

## 2019-05-31 DIAGNOSIS — G609 Hereditary and idiopathic neuropathy, unspecified: Secondary | ICD-10-CM | POA: Diagnosis not present

## 2019-05-31 DIAGNOSIS — F419 Anxiety disorder, unspecified: Secondary | ICD-10-CM | POA: Diagnosis not present

## 2019-06-05 ENCOUNTER — Other Ambulatory Visit: Payer: Self-pay | Admitting: *Deleted

## 2019-06-05 DIAGNOSIS — F419 Anxiety disorder, unspecified: Secondary | ICD-10-CM | POA: Diagnosis not present

## 2019-06-05 DIAGNOSIS — G894 Chronic pain syndrome: Secondary | ICD-10-CM | POA: Diagnosis not present

## 2019-06-05 DIAGNOSIS — F17210 Nicotine dependence, cigarettes, uncomplicated: Secondary | ICD-10-CM

## 2019-06-05 DIAGNOSIS — D129 Benign neoplasm of anus and anal canal: Secondary | ICD-10-CM | POA: Diagnosis not present

## 2019-06-05 DIAGNOSIS — I700 Atherosclerosis of aorta: Secondary | ICD-10-CM | POA: Diagnosis not present

## 2019-06-05 DIAGNOSIS — Z87891 Personal history of nicotine dependence: Secondary | ICD-10-CM

## 2019-06-05 DIAGNOSIS — E782 Mixed hyperlipidemia: Secondary | ICD-10-CM | POA: Diagnosis not present

## 2019-06-05 DIAGNOSIS — G609 Hereditary and idiopathic neuropathy, unspecified: Secondary | ICD-10-CM | POA: Diagnosis not present

## 2019-06-05 DIAGNOSIS — R630 Anorexia: Secondary | ICD-10-CM | POA: Diagnosis not present

## 2019-06-05 DIAGNOSIS — F319 Bipolar disorder, unspecified: Secondary | ICD-10-CM | POA: Diagnosis not present

## 2019-06-05 DIAGNOSIS — Z122 Encounter for screening for malignant neoplasm of respiratory organs: Secondary | ICD-10-CM

## 2019-06-05 DIAGNOSIS — M5136 Other intervertebral disc degeneration, lumbar region: Secondary | ICD-10-CM | POA: Diagnosis not present

## 2019-06-07 DIAGNOSIS — M5136 Other intervertebral disc degeneration, lumbar region: Secondary | ICD-10-CM | POA: Diagnosis not present

## 2019-06-07 DIAGNOSIS — E782 Mixed hyperlipidemia: Secondary | ICD-10-CM | POA: Diagnosis not present

## 2019-06-07 DIAGNOSIS — F419 Anxiety disorder, unspecified: Secondary | ICD-10-CM | POA: Diagnosis not present

## 2019-06-07 DIAGNOSIS — D129 Benign neoplasm of anus and anal canal: Secondary | ICD-10-CM | POA: Diagnosis not present

## 2019-06-07 DIAGNOSIS — G609 Hereditary and idiopathic neuropathy, unspecified: Secondary | ICD-10-CM | POA: Diagnosis not present

## 2019-06-07 DIAGNOSIS — G894 Chronic pain syndrome: Secondary | ICD-10-CM | POA: Diagnosis not present

## 2019-06-07 DIAGNOSIS — F319 Bipolar disorder, unspecified: Secondary | ICD-10-CM | POA: Diagnosis not present

## 2019-06-07 DIAGNOSIS — R630 Anorexia: Secondary | ICD-10-CM | POA: Diagnosis not present

## 2019-06-07 DIAGNOSIS — I700 Atherosclerosis of aorta: Secondary | ICD-10-CM | POA: Diagnosis not present

## 2019-06-12 DIAGNOSIS — R630 Anorexia: Secondary | ICD-10-CM | POA: Diagnosis not present

## 2019-06-12 DIAGNOSIS — G609 Hereditary and idiopathic neuropathy, unspecified: Secondary | ICD-10-CM | POA: Diagnosis not present

## 2019-06-12 DIAGNOSIS — F419 Anxiety disorder, unspecified: Secondary | ICD-10-CM | POA: Diagnosis not present

## 2019-06-12 DIAGNOSIS — D129 Benign neoplasm of anus and anal canal: Secondary | ICD-10-CM | POA: Diagnosis not present

## 2019-06-12 DIAGNOSIS — E782 Mixed hyperlipidemia: Secondary | ICD-10-CM | POA: Diagnosis not present

## 2019-06-12 DIAGNOSIS — F319 Bipolar disorder, unspecified: Secondary | ICD-10-CM | POA: Diagnosis not present

## 2019-06-12 DIAGNOSIS — I700 Atherosclerosis of aorta: Secondary | ICD-10-CM | POA: Diagnosis not present

## 2019-06-12 DIAGNOSIS — G894 Chronic pain syndrome: Secondary | ICD-10-CM | POA: Diagnosis not present

## 2019-06-12 DIAGNOSIS — M5136 Other intervertebral disc degeneration, lumbar region: Secondary | ICD-10-CM | POA: Diagnosis not present

## 2019-06-14 DIAGNOSIS — I700 Atherosclerosis of aorta: Secondary | ICD-10-CM | POA: Diagnosis not present

## 2019-06-14 DIAGNOSIS — D129 Benign neoplasm of anus and anal canal: Secondary | ICD-10-CM | POA: Diagnosis not present

## 2019-06-14 DIAGNOSIS — E782 Mixed hyperlipidemia: Secondary | ICD-10-CM | POA: Diagnosis not present

## 2019-06-14 DIAGNOSIS — F319 Bipolar disorder, unspecified: Secondary | ICD-10-CM | POA: Diagnosis not present

## 2019-06-14 DIAGNOSIS — G609 Hereditary and idiopathic neuropathy, unspecified: Secondary | ICD-10-CM | POA: Diagnosis not present

## 2019-06-14 DIAGNOSIS — F419 Anxiety disorder, unspecified: Secondary | ICD-10-CM | POA: Diagnosis not present

## 2019-06-14 DIAGNOSIS — R630 Anorexia: Secondary | ICD-10-CM | POA: Diagnosis not present

## 2019-06-14 DIAGNOSIS — M5136 Other intervertebral disc degeneration, lumbar region: Secondary | ICD-10-CM | POA: Diagnosis not present

## 2019-06-14 DIAGNOSIS — G894 Chronic pain syndrome: Secondary | ICD-10-CM | POA: Diagnosis not present

## 2019-06-18 ENCOUNTER — Ambulatory Visit: Payer: Medicare HMO

## 2019-06-19 DIAGNOSIS — I700 Atherosclerosis of aorta: Secondary | ICD-10-CM | POA: Diagnosis not present

## 2019-06-19 DIAGNOSIS — G894 Chronic pain syndrome: Secondary | ICD-10-CM | POA: Diagnosis not present

## 2019-06-19 DIAGNOSIS — F419 Anxiety disorder, unspecified: Secondary | ICD-10-CM | POA: Diagnosis not present

## 2019-06-19 DIAGNOSIS — D129 Benign neoplasm of anus and anal canal: Secondary | ICD-10-CM | POA: Diagnosis not present

## 2019-06-19 DIAGNOSIS — M5136 Other intervertebral disc degeneration, lumbar region: Secondary | ICD-10-CM | POA: Diagnosis not present

## 2019-06-19 DIAGNOSIS — E782 Mixed hyperlipidemia: Secondary | ICD-10-CM | POA: Diagnosis not present

## 2019-06-19 DIAGNOSIS — G609 Hereditary and idiopathic neuropathy, unspecified: Secondary | ICD-10-CM | POA: Diagnosis not present

## 2019-06-19 DIAGNOSIS — F319 Bipolar disorder, unspecified: Secondary | ICD-10-CM | POA: Diagnosis not present

## 2019-06-19 DIAGNOSIS — R630 Anorexia: Secondary | ICD-10-CM | POA: Diagnosis not present

## 2019-06-20 DIAGNOSIS — G629 Polyneuropathy, unspecified: Secondary | ICD-10-CM | POA: Diagnosis not present

## 2019-06-20 DIAGNOSIS — G4700 Insomnia, unspecified: Secondary | ICD-10-CM | POA: Diagnosis not present

## 2019-06-20 DIAGNOSIS — Z720 Tobacco use: Secondary | ICD-10-CM | POA: Diagnosis not present

## 2019-06-21 DIAGNOSIS — F319 Bipolar disorder, unspecified: Secondary | ICD-10-CM | POA: Diagnosis not present

## 2019-06-21 DIAGNOSIS — D129 Benign neoplasm of anus and anal canal: Secondary | ICD-10-CM | POA: Diagnosis not present

## 2019-06-21 DIAGNOSIS — I700 Atherosclerosis of aorta: Secondary | ICD-10-CM | POA: Diagnosis not present

## 2019-06-21 DIAGNOSIS — M5136 Other intervertebral disc degeneration, lumbar region: Secondary | ICD-10-CM | POA: Diagnosis not present

## 2019-06-21 DIAGNOSIS — G894 Chronic pain syndrome: Secondary | ICD-10-CM | POA: Diagnosis not present

## 2019-06-21 DIAGNOSIS — F419 Anxiety disorder, unspecified: Secondary | ICD-10-CM | POA: Diagnosis not present

## 2019-06-21 DIAGNOSIS — G609 Hereditary and idiopathic neuropathy, unspecified: Secondary | ICD-10-CM | POA: Diagnosis not present

## 2019-06-21 DIAGNOSIS — E782 Mixed hyperlipidemia: Secondary | ICD-10-CM | POA: Diagnosis not present

## 2019-06-21 DIAGNOSIS — R630 Anorexia: Secondary | ICD-10-CM | POA: Diagnosis not present

## 2019-06-26 DIAGNOSIS — R630 Anorexia: Secondary | ICD-10-CM | POA: Diagnosis not present

## 2019-06-26 DIAGNOSIS — M5136 Other intervertebral disc degeneration, lumbar region: Secondary | ICD-10-CM | POA: Diagnosis not present

## 2019-06-26 DIAGNOSIS — I700 Atherosclerosis of aorta: Secondary | ICD-10-CM | POA: Diagnosis not present

## 2019-06-26 DIAGNOSIS — G609 Hereditary and idiopathic neuropathy, unspecified: Secondary | ICD-10-CM | POA: Diagnosis not present

## 2019-06-26 DIAGNOSIS — G894 Chronic pain syndrome: Secondary | ICD-10-CM | POA: Diagnosis not present

## 2019-06-26 DIAGNOSIS — D129 Benign neoplasm of anus and anal canal: Secondary | ICD-10-CM | POA: Diagnosis not present

## 2019-06-26 DIAGNOSIS — F319 Bipolar disorder, unspecified: Secondary | ICD-10-CM | POA: Diagnosis not present

## 2019-06-26 DIAGNOSIS — E782 Mixed hyperlipidemia: Secondary | ICD-10-CM | POA: Diagnosis not present

## 2019-06-26 DIAGNOSIS — F419 Anxiety disorder, unspecified: Secondary | ICD-10-CM | POA: Diagnosis not present

## 2019-06-28 DIAGNOSIS — R630 Anorexia: Secondary | ICD-10-CM | POA: Diagnosis not present

## 2019-06-28 DIAGNOSIS — D129 Benign neoplasm of anus and anal canal: Secondary | ICD-10-CM | POA: Diagnosis not present

## 2019-06-28 DIAGNOSIS — E782 Mixed hyperlipidemia: Secondary | ICD-10-CM | POA: Diagnosis not present

## 2019-06-28 DIAGNOSIS — F419 Anxiety disorder, unspecified: Secondary | ICD-10-CM | POA: Diagnosis not present

## 2019-06-28 DIAGNOSIS — I700 Atherosclerosis of aorta: Secondary | ICD-10-CM | POA: Diagnosis not present

## 2019-06-28 DIAGNOSIS — G894 Chronic pain syndrome: Secondary | ICD-10-CM | POA: Diagnosis not present

## 2019-06-28 DIAGNOSIS — M5136 Other intervertebral disc degeneration, lumbar region: Secondary | ICD-10-CM | POA: Diagnosis not present

## 2019-06-28 DIAGNOSIS — G609 Hereditary and idiopathic neuropathy, unspecified: Secondary | ICD-10-CM | POA: Diagnosis not present

## 2019-06-28 DIAGNOSIS — F319 Bipolar disorder, unspecified: Secondary | ICD-10-CM | POA: Diagnosis not present

## 2019-07-03 DIAGNOSIS — G894 Chronic pain syndrome: Secondary | ICD-10-CM | POA: Diagnosis not present

## 2019-07-03 DIAGNOSIS — I700 Atherosclerosis of aorta: Secondary | ICD-10-CM | POA: Diagnosis not present

## 2019-07-03 DIAGNOSIS — R630 Anorexia: Secondary | ICD-10-CM | POA: Diagnosis not present

## 2019-07-03 DIAGNOSIS — F419 Anxiety disorder, unspecified: Secondary | ICD-10-CM | POA: Diagnosis not present

## 2019-07-03 DIAGNOSIS — D129 Benign neoplasm of anus and anal canal: Secondary | ICD-10-CM | POA: Diagnosis not present

## 2019-07-03 DIAGNOSIS — M5136 Other intervertebral disc degeneration, lumbar region: Secondary | ICD-10-CM | POA: Diagnosis not present

## 2019-07-03 DIAGNOSIS — E782 Mixed hyperlipidemia: Secondary | ICD-10-CM | POA: Diagnosis not present

## 2019-07-03 DIAGNOSIS — F319 Bipolar disorder, unspecified: Secondary | ICD-10-CM | POA: Diagnosis not present

## 2019-07-03 DIAGNOSIS — G609 Hereditary and idiopathic neuropathy, unspecified: Secondary | ICD-10-CM | POA: Diagnosis not present

## 2019-07-05 DIAGNOSIS — E782 Mixed hyperlipidemia: Secondary | ICD-10-CM | POA: Diagnosis not present

## 2019-07-05 DIAGNOSIS — R630 Anorexia: Secondary | ICD-10-CM | POA: Diagnosis not present

## 2019-07-05 DIAGNOSIS — G609 Hereditary and idiopathic neuropathy, unspecified: Secondary | ICD-10-CM | POA: Diagnosis not present

## 2019-07-05 DIAGNOSIS — G894 Chronic pain syndrome: Secondary | ICD-10-CM | POA: Diagnosis not present

## 2019-07-05 DIAGNOSIS — F419 Anxiety disorder, unspecified: Secondary | ICD-10-CM | POA: Diagnosis not present

## 2019-07-05 DIAGNOSIS — M5136 Other intervertebral disc degeneration, lumbar region: Secondary | ICD-10-CM | POA: Diagnosis not present

## 2019-07-05 DIAGNOSIS — D129 Benign neoplasm of anus and anal canal: Secondary | ICD-10-CM | POA: Diagnosis not present

## 2019-07-05 DIAGNOSIS — F319 Bipolar disorder, unspecified: Secondary | ICD-10-CM | POA: Diagnosis not present

## 2019-07-05 DIAGNOSIS — I700 Atherosclerosis of aorta: Secondary | ICD-10-CM | POA: Diagnosis not present

## 2019-07-10 DIAGNOSIS — I700 Atherosclerosis of aorta: Secondary | ICD-10-CM | POA: Diagnosis not present

## 2019-07-10 DIAGNOSIS — G609 Hereditary and idiopathic neuropathy, unspecified: Secondary | ICD-10-CM | POA: Diagnosis not present

## 2019-07-10 DIAGNOSIS — G894 Chronic pain syndrome: Secondary | ICD-10-CM | POA: Diagnosis not present

## 2019-07-10 DIAGNOSIS — E782 Mixed hyperlipidemia: Secondary | ICD-10-CM | POA: Diagnosis not present

## 2019-07-10 DIAGNOSIS — F419 Anxiety disorder, unspecified: Secondary | ICD-10-CM | POA: Diagnosis not present

## 2019-07-10 DIAGNOSIS — M5136 Other intervertebral disc degeneration, lumbar region: Secondary | ICD-10-CM | POA: Diagnosis not present

## 2019-07-10 DIAGNOSIS — D129 Benign neoplasm of anus and anal canal: Secondary | ICD-10-CM | POA: Diagnosis not present

## 2019-07-10 DIAGNOSIS — F319 Bipolar disorder, unspecified: Secondary | ICD-10-CM | POA: Diagnosis not present

## 2019-07-10 DIAGNOSIS — R630 Anorexia: Secondary | ICD-10-CM | POA: Diagnosis not present

## 2019-07-12 DIAGNOSIS — R630 Anorexia: Secondary | ICD-10-CM | POA: Diagnosis not present

## 2019-07-12 DIAGNOSIS — F419 Anxiety disorder, unspecified: Secondary | ICD-10-CM | POA: Diagnosis not present

## 2019-07-12 DIAGNOSIS — D129 Benign neoplasm of anus and anal canal: Secondary | ICD-10-CM | POA: Diagnosis not present

## 2019-07-12 DIAGNOSIS — F319 Bipolar disorder, unspecified: Secondary | ICD-10-CM | POA: Diagnosis not present

## 2019-07-12 DIAGNOSIS — G609 Hereditary and idiopathic neuropathy, unspecified: Secondary | ICD-10-CM | POA: Diagnosis not present

## 2019-07-12 DIAGNOSIS — I700 Atherosclerosis of aorta: Secondary | ICD-10-CM | POA: Diagnosis not present

## 2019-07-12 DIAGNOSIS — E782 Mixed hyperlipidemia: Secondary | ICD-10-CM | POA: Diagnosis not present

## 2019-07-12 DIAGNOSIS — G894 Chronic pain syndrome: Secondary | ICD-10-CM | POA: Diagnosis not present

## 2019-07-12 DIAGNOSIS — M5136 Other intervertebral disc degeneration, lumbar region: Secondary | ICD-10-CM | POA: Diagnosis not present

## 2019-07-17 DIAGNOSIS — F319 Bipolar disorder, unspecified: Secondary | ICD-10-CM | POA: Diagnosis not present

## 2019-07-17 DIAGNOSIS — I700 Atherosclerosis of aorta: Secondary | ICD-10-CM | POA: Diagnosis not present

## 2019-07-17 DIAGNOSIS — F419 Anxiety disorder, unspecified: Secondary | ICD-10-CM | POA: Diagnosis not present

## 2019-07-17 DIAGNOSIS — R630 Anorexia: Secondary | ICD-10-CM | POA: Diagnosis not present

## 2019-07-17 DIAGNOSIS — G609 Hereditary and idiopathic neuropathy, unspecified: Secondary | ICD-10-CM | POA: Diagnosis not present

## 2019-07-17 DIAGNOSIS — E782 Mixed hyperlipidemia: Secondary | ICD-10-CM | POA: Diagnosis not present

## 2019-07-17 DIAGNOSIS — D129 Benign neoplasm of anus and anal canal: Secondary | ICD-10-CM | POA: Diagnosis not present

## 2019-07-17 DIAGNOSIS — G894 Chronic pain syndrome: Secondary | ICD-10-CM | POA: Diagnosis not present

## 2019-07-17 DIAGNOSIS — M5136 Other intervertebral disc degeneration, lumbar region: Secondary | ICD-10-CM | POA: Diagnosis not present

## 2019-07-18 DIAGNOSIS — M5136 Other intervertebral disc degeneration, lumbar region: Secondary | ICD-10-CM | POA: Diagnosis not present

## 2019-07-18 DIAGNOSIS — D129 Benign neoplasm of anus and anal canal: Secondary | ICD-10-CM | POA: Diagnosis not present

## 2019-07-18 DIAGNOSIS — G609 Hereditary and idiopathic neuropathy, unspecified: Secondary | ICD-10-CM | POA: Diagnosis not present

## 2019-07-18 DIAGNOSIS — G894 Chronic pain syndrome: Secondary | ICD-10-CM | POA: Diagnosis not present

## 2019-07-18 DIAGNOSIS — F319 Bipolar disorder, unspecified: Secondary | ICD-10-CM | POA: Diagnosis not present

## 2019-07-18 DIAGNOSIS — E782 Mixed hyperlipidemia: Secondary | ICD-10-CM | POA: Diagnosis not present

## 2019-07-18 DIAGNOSIS — I700 Atherosclerosis of aorta: Secondary | ICD-10-CM | POA: Diagnosis not present

## 2019-07-18 DIAGNOSIS — R630 Anorexia: Secondary | ICD-10-CM | POA: Diagnosis not present

## 2019-07-18 DIAGNOSIS — F419 Anxiety disorder, unspecified: Secondary | ICD-10-CM | POA: Diagnosis not present

## 2019-07-23 ENCOUNTER — Ambulatory Visit: Payer: Medicare HMO

## 2020-01-04 ENCOUNTER — Other Ambulatory Visit: Payer: Self-pay

## 2020-01-04 ENCOUNTER — Encounter (HOSPITAL_COMMUNITY): Payer: Self-pay

## 2020-01-04 ENCOUNTER — Emergency Department (HOSPITAL_COMMUNITY)
Admission: EM | Admit: 2020-01-04 | Discharge: 2020-01-04 | Disposition: A | Discharge status: Home / Self Care | Payer: Medicare HMO | Attending: Emergency Medicine | Admitting: Emergency Medicine

## 2020-01-04 ENCOUNTER — Emergency Department (HOSPITAL_COMMUNITY): Payer: Medicare HMO

## 2020-01-04 DIAGNOSIS — E876 Hypokalemia: Secondary | ICD-10-CM | POA: Diagnosis not present

## 2020-01-04 DIAGNOSIS — Y9301 Activity, walking, marching and hiking: Secondary | ICD-10-CM | POA: Insufficient documentation

## 2020-01-04 DIAGNOSIS — E872 Acidosis: Secondary | ICD-10-CM | POA: Diagnosis not present

## 2020-01-04 DIAGNOSIS — R262 Difficulty in walking, not elsewhere classified: Secondary | ICD-10-CM | POA: Insufficient documentation

## 2020-01-04 DIAGNOSIS — Z79899 Other long term (current) drug therapy: Secondary | ICD-10-CM | POA: Insufficient documentation

## 2020-01-04 DIAGNOSIS — Y999 Unspecified external cause status: Secondary | ICD-10-CM | POA: Insufficient documentation

## 2020-01-04 DIAGNOSIS — W01190A Fall on same level from slipping, tripping and stumbling with subsequent striking against furniture, initial encounter: Secondary | ICD-10-CM | POA: Insufficient documentation

## 2020-01-04 DIAGNOSIS — Y92003 Bedroom of unspecified non-institutional (private) residence as the place of occurrence of the external cause: Secondary | ICD-10-CM | POA: Diagnosis not present

## 2020-01-04 DIAGNOSIS — F17210 Nicotine dependence, cigarettes, uncomplicated: Secondary | ICD-10-CM | POA: Insufficient documentation

## 2020-01-04 DIAGNOSIS — R4182 Altered mental status, unspecified: Secondary | ICD-10-CM | POA: Diagnosis present

## 2020-01-04 DIAGNOSIS — S0990XA Unspecified injury of head, initial encounter: Secondary | ICD-10-CM | POA: Diagnosis not present

## 2020-01-04 DIAGNOSIS — R0902 Hypoxemia: Secondary | ICD-10-CM | POA: Diagnosis not present

## 2020-01-04 DIAGNOSIS — R410 Disorientation, unspecified: Secondary | ICD-10-CM | POA: Diagnosis not present

## 2020-01-04 DIAGNOSIS — E8729 Other acidosis: Secondary | ICD-10-CM

## 2020-01-04 DIAGNOSIS — W19XXXA Unspecified fall, initial encounter: Secondary | ICD-10-CM

## 2020-01-04 DIAGNOSIS — R531 Weakness: Secondary | ICD-10-CM | POA: Diagnosis not present

## 2020-01-04 DIAGNOSIS — S199XXA Unspecified injury of neck, initial encounter: Secondary | ICD-10-CM | POA: Diagnosis not present

## 2020-01-04 LAB — CBC WITH DIFFERENTIAL/PLATELET
Abs Immature Granulocytes: 0.02 10*3/uL (ref 0.00–0.07)
Basophils Absolute: 0 10*3/uL (ref 0.0–0.1)
Basophils Relative: 0 %
Eosinophils Absolute: 0 10*3/uL (ref 0.0–0.5)
Eosinophils Relative: 0 %
HCT: 31.4 % — ABNORMAL LOW (ref 36.0–46.0)
Hemoglobin: 10.3 g/dL — ABNORMAL LOW (ref 12.0–15.0)
Immature Granulocytes: 0 %
Lymphocytes Relative: 33 %
Lymphs Abs: 2.2 10*3/uL (ref 0.7–4.0)
MCH: 38.3 pg — ABNORMAL HIGH (ref 26.0–34.0)
MCHC: 32.8 g/dL (ref 30.0–36.0)
MCV: 116.7 fL — ABNORMAL HIGH (ref 80.0–100.0)
Monocytes Absolute: 0.7 10*3/uL (ref 0.1–1.0)
Monocytes Relative: 11 %
Neutro Abs: 3.7 10*3/uL (ref 1.7–7.7)
Neutrophils Relative %: 56 %
Platelets: 203 10*3/uL (ref 150–400)
RBC: 2.69 MIL/uL — ABNORMAL LOW (ref 3.87–5.11)
RDW: 16.6 % — ABNORMAL HIGH (ref 11.5–15.5)
WBC: 6.6 10*3/uL (ref 4.0–10.5)
nRBC: 0 % (ref 0.0–0.2)

## 2020-01-04 LAB — BASIC METABOLIC PANEL
Anion gap: 13 (ref 5–15)
BUN: 15 mg/dL (ref 8–23)
CO2: 20 mmol/L — ABNORMAL LOW (ref 22–32)
Calcium: 8.2 mg/dL — ABNORMAL LOW (ref 8.9–10.3)
Chloride: 105 mmol/L (ref 98–111)
Creatinine, Ser: 0.65 mg/dL (ref 0.44–1.00)
GFR calc Af Amer: >60 mL/min (ref >60)
GFR calc non Af Amer: >60 mL/min (ref >60)
Glucose, Bld: 110 mg/dL — ABNORMAL HIGH (ref 70–99)
Potassium: 2.8 mmol/L — ABNORMAL LOW (ref 3.5–5.1)
Sodium: 138 mmol/L (ref 135–145)

## 2020-01-04 LAB — COMPREHENSIVE METABOLIC PANEL
ALT: 30 U/L (ref 0–44)
AST: 60 U/L — ABNORMAL HIGH (ref 15–41)
Albumin: 3.6 g/dL (ref 3.5–5.0)
Alkaline Phosphatase: 113 U/L (ref 38–126)
Anion gap: 26 — ABNORMAL HIGH (ref 5–15)
BUN: 18 mg/dL (ref 8–23)
CO2: 15 mmol/L — ABNORMAL LOW (ref 22–32)
Calcium: 9.8 mg/dL (ref 8.9–10.3)
Chloride: 96 mmol/L — ABNORMAL LOW (ref 98–111)
Creatinine, Ser: 1 mg/dL (ref 0.44–1.00)
GFR calc Af Amer: >60 mL/min (ref >60)
GFR calc non Af Amer: 60 mL/min — ABNORMAL LOW (ref >60)
Glucose, Bld: 71 mg/dL (ref 70–99)
Potassium: 3.5 mmol/L (ref 3.5–5.1)
Sodium: 137 mmol/L (ref 135–145)
Total Bilirubin: 2.1 mg/dL — ABNORMAL HIGH (ref 0.3–1.2)
Total Protein: 6.7 g/dL (ref 6.5–8.1)

## 2020-01-04 LAB — CK: Total CK: 92 U/L (ref 38–234)

## 2020-01-04 LAB — LACTIC ACID, PLASMA: Lactic Acid, Venous: 1.1 mmol/L (ref 0.5–1.9)

## 2020-01-04 LAB — SALICYLATE LEVEL: Salicylate Lvl: <7 mg/dL — ABNORMAL LOW (ref 7.0–30.0)

## 2020-01-04 LAB — CBG MONITORING, ED: Glucose-Capillary: 67 mg/dL — ABNORMAL LOW (ref 70–99)

## 2020-01-04 LAB — ETHANOL: Alcohol, Ethyl (B): <10 mg/dL (ref <10)

## 2020-01-04 LAB — BETA-HYDROXYBUTYRIC ACID: Beta-Hydroxybutyric Acid: 7.59 mmol/L — ABNORMAL HIGH (ref 0.05–0.27)

## 2020-01-04 LAB — AMMONIA: Ammonia: 16 umol/L (ref 9–35)

## 2020-01-04 MED ORDER — POTASSIUM CHLORIDE CRYS ER 20 MEQ PO TBCR: 40.0000 meq | EXTENDED_RELEASE_TABLET | Freq: Every day | ORAL | 0 refills | Status: DC

## 2020-01-04 MED ORDER — SODIUM CHLORIDE 0.9 % IV BOLUS
1000.0000 mL | Freq: Once | INTRAVENOUS | Status: AC
  Administered 2020-01-04: 1000 mL via INTRAVENOUS

## 2020-01-04 MED ORDER — POTASSIUM CHLORIDE CRYS ER 20 MEQ PO TBCR
40.0000 meq | EXTENDED_RELEASE_TABLET | Freq: Once | ORAL | Status: AC
  Administered 2020-01-04: 40 meq via ORAL
  Filled 2020-01-04: qty 2

## 2020-01-04 NOTE — ED Triage Notes (Addendum)
Pt BIB EMS from home. Pts sister checked on the pt today and and said that she has been more disoriented than usual. Disoriented to time and event. Pts sister reports she think the pt fell on the floor on Wednesday and has been crawling around on the floor since then but still taking care of herself. Pt reports she has been on the ground since Saturday. Pt has small hematoma on left back side of head.

## 2020-01-04 NOTE — ED Provider Notes (Signed)
[facility] DEPT Provider Note   CSN: [id number] Arrival date & time: 01/04/20  1118     History Chief Complaint  Patient presents with  . Fall  . Altered Mental Status    Monique Perry is a 64 y.o. female with history of ETOH abuse and peripheral neuropathy who presents with a fall, confusion, and inability to walk. She states that she had a fall when coming back from the bathroom a week and a half ago. She fell in between her nightstand and bed. She hit her head on the bed rail and had small hematomas on her head which have resolved. Since then she states that she has been "feeling funny". She reports confusion at times such as she had an episode where she thought her mother was in the apartment but her mother lives somewhere else. She states she has not been walking and has just been crawling on the floor and sleeping on the floor in her apartment because she is afraid to walk. She doesn't think she can walk at this point because of her neuropathy but also says that she doesn't think it's because of the neuropathy. She reports associated posterior neck pain and difficulty lifting her head from neck weakness. She denies arm or leg weakness or pain but just has constant altered sensation in her lower extremities. She reports she has been drinking fluids but doesn't eat much which is normal for her. She denies dizziness, LOC, vision changes, N/V. No CP or SOB. She lives in an apartment by herself with her cat. She has a cane and 2 different walkers.  HPI     Past Medical History:  Diagnosis Date  . Alcohol abuse last February 2012  . Arthritis   . Bipolar disorder ([place])   . Collagenous colitis   . Drug abuse ([place]) last 1990's  . Peripheral neuropathy 11/29/2018    Patient Active Problem List   Diagnosis Date Noted  . Peripheral neuropathy 11/29/2018  . Hypercalcemia 01/18/2017  . Splenic rupture 07/15/2016  . Serrated adenoma of cecum s/p  polypectomy 2011 07/15/2016  . Neck pain on left side 11/02/2014  . Elevated transaminase level 02/14/2014  . Anemia 11/14/2013  . History of alcoholism ([place]) 11/12/2013  . Hepatitis B vaccination administered at current visit 11/12/2013  . Knee pain 06/18/2013  . Right foot pain s/p fusion surgery of right foot 05/10/2013  . Fracture of metatarsal of right foot, closed 03/20/2013  . Abnormality of gait 03/20/2013  . Low back pain radiating to right leg 02/20/2013  . Chronic back pain 02/08/2013  . Atherosclerotic peripheral vascular disease with intermittent claudication ([place]) 02/08/2013  . Carotid bruit 02/08/2013  . High blood pressure 02/08/2013  . LSIL (low grade squamous intraepithelial lesion) on Pap smear 12/14/2012  . Osteopenia 10/02/2012  . Hyperlipidemia 08/31/2012  . Depression 08/31/2012  . left nose skin lesion 08/31/2012  . Drug abuse, history of   . Bipolar disorder ([place])   . Collagenous colitis   . Medically noncompliant 07/26/2012  . GERD (gastroesophageal reflux disease) 07/21/2012  . Tobacco abuse 07/21/2012    Past Surgical History:  Procedure Laterality Date  . ABDOMINAL HYSTERECTOMY  Age 35 years ago   partial   . FOOT SURGERY    . IR GENERIC HISTORICAL  07/16/2016   IR US GUIDE VASC ACCESS RIGHT 07/16/2016 Arne Cleveland, MD [facility]  . IR GENERIC HISTORICAL  07/16/2016   IR ANGIOGRAM VISCERAL SELECTIVE 07/16/2016 Arne Cleveland, MD  [facility]  . IR GENERIC HISTORICAL  07/16/2016   IR ANGIOGRAM SELECTIVE EACH ADDITIONAL VESSEL 07/16/2016 Arne Cleveland, MD [facility]  . IR GENERIC HISTORICAL  07/16/2016   IR EMBO ART  VEN HEMORR LYMPH EXTRAV  INC GUIDE ROADMAPPING 07/16/2016 Arne Cleveland, MD [facility]  . IR GENERIC HISTORICAL  07/16/2016   IR ANGIOGRAM FOLLOW UP STUDY 07/16/2016 Arne Cleveland, MD [facility]  . [place] fusion Right 04/17/2012  . NOSE SURGERY    . OSTEOTOMY Right 04/17/2012   Rt #2 Metatarsal  . TONSILLECTOMY  As a child   . WRIST  SURGERY       OB History    Gravida  3   Para  0   Term      Preterm      AB  3   Living  0     SAB  2   TAB  1   Ectopic  0   Multiple  0   Live Births           Obstetric Comments  Miscarriage due to drug abuse.        Family History  Problem Relation Age of Onset  . Stroke Mother   . Diabetes Father   . Hypertension Father   . Hyperlipidemia Father   . Breast cancer Sister     Social History   Tobacco Use  . Smoking status: Current Some Day Smoker    Packs/day: 0.75    Years: 45.00    Pack years: 33.75    Types: Cigarettes, Cigars  . Smokeless tobacco: Never Used  . Tobacco comment: smoking cig for 35 years. continues to smoke 1/2 pack daily  Substance Use Topics  . Alcohol use: Yes    Alcohol/week: 0.0 standard drinks    Comment: ETOH abuse for 25 year, 1/2 gallon Liquor daily. admits 6 weeks course of drinking ETOH as her only intake in 2011. she states that she quits her drinnking since Feb 2012.  . Drug use: No    Comment: pots before, clean since [place] Medications Prior to Admission medications   Medication Sig Start Date End Date Taking? Authorizing Provider  diphenoxylate-atropine (LOMOTIL) 2.5-0.025 MG tablet Take 1-2 tablets by mouth See admin instructions. Take 2 tablets by mouth daily as needed for diarrhea/loose stool, may repeat in one hour if still needed.    [provider]  ferrous sulfate 325 (65 FE) MG tablet Take 325 mg by mouth at bedtime. 11/03/18   [provider]  folic acid (FOLVITE) 1 MG tablet Take 1 mg by mouth daily. 11/03/18   [provider]  gabapentin (NEURONTIN) 600 MG tablet TAKE 1 TABLET BY MOUTH THREE TIMES A DAY 01/15/19   Kathrynn Ducking, MD  ondansetron (ZOFRAN ODT) 4 MG disintegrating tablet 4mg  ODT q4 hours prn nausea/vomit 01/08/19   Elnora Morrison, MD  Polyvinyl Alcohol-Povidone (REFRESH OP) Place 1 drop into both eyes daily as needed (dry eyes).    [provider]  thiamine (VITAMIN B-1) 100 MG tablet Take 1 tablet (100 mg total) by mouth daily. 12/21/18   Kathrynn Ducking, MD  vitamin B-12 (CYANOCOBALAMIN) 100 MCG tablet Take 100 mcg by mouth daily. 11/06/18   [provider]    Allergies    Erythrocin, Sulfa antibiotics, Trazodone and nefazodone, and Tramadol  Review of Systems   Review of Systems  Constitutional: Positive for activity change. Negative for appetite change, chills and fever.  Eyes: Negative for visual disturbance.  Respiratory: Negative for shortness of breath.   Cardiovascular: Negative for chest pain.  Musculoskeletal: Positive for gait problem and neck pain.  Neurological: Positive for numbness and headaches. Negative for dizziness, syncope and weakness.  Psychiatric/Behavioral: Positive for confusion.  All other systems reviewed and are negative.   Physical Exam Updated Vital Signs BP 137/64   Pulse (!) 105   Temp 98.4 F (36.9 C) (Oral)   Resp 18   SpO2 100%   Physical Exam Vitals and nursing note reviewed.  Constitutional:      General: She is not in acute distress.    Appearance: Normal appearance. She is well-developed. She is ill-appearing (chronically ill).     Comments: Calm and cooperative. Alert and gives full history. No confusion noted  HENT:     Head: Normocephalic and atraumatic.  Eyes:     General: No scleral icterus.       Right eye: No discharge.        Left eye: No discharge.     Conjunctiva/sclera: Conjunctivae normal.     Pupils: Pupils are equal, round, and reactive to light.  Cardiovascular:     Rate and Rhythm: Normal rate and regular rhythm.  Pulmonary:     Effort: Pulmonary effort is normal. No respiratory distress.     Breath sounds: Normal breath sounds.  Abdominal:     General: There is no distension.  Musculoskeletal:     Cervical back: Normal range of motion.     Comments: Moves all extremities. Intact distal pulses  Sores over the bilateral great  toes which are erythematous but do not appear infected  Skin:    General: Skin is warm and dry.  Neurological:     Mental Status: She is alert and oriented to person, place, and time.  Psychiatric:        Behavior: Behavior normal.     ED Results / Procedures / Treatments   Labs (all labs ordered are listed, but only abnormal results are displayed) Labs Reviewed  COMPREHENSIVE METABOLIC PANEL - Abnormal; Notable for the following components:      Result Value   Chloride 96 (*)    CO2 15 (*)    AST 60 (*)    Total Bilirubin 2.1 (*)    GFR calc non Af Amer 60 (*)    Anion gap 26 (*)    All other components within normal limits  CBC WITH DIFFERENTIAL/PLATELET - Abnormal; Notable for the following components:   RBC 2.69 (*)    Hemoglobin 10.3 (*)    HCT 31.4 (*)    MCV 116.7 (*)    MCH 38.3 (*)    RDW 16.6 (*)    All other components within normal limits  BETA-HYDROXYBUTYRIC ACID - Abnormal; Notable for the following components:   Beta-Hydroxybutyric Acid 7.59 (*)    All other components within normal limits  SALICYLATE LEVEL - Abnormal; Notable for the following components:   Salicylate Lvl [id number] (*)    All other components within normal limits  BASIC METABOLIC PANEL - Abnormal; Notable for the following components:   Potassium 2.8 (*)    CO2 20 (*)    Glucose, Bld 110 (*)    Calcium 8.2 (*)    All other components within normal limits  CBG MONITORING, ED - Abnormal; Notable for the following components:   Glucose-Capillary 67 (*)    All other components within normal limits  AMMONIA  ETHANOL  CK  LACTIC ACID, PLASMA    EKG None  Radiology CT HEAD WO CONTRAST  Result Date: 01/04/2020 CLINICAL DATA:  Unwitnessed fall. EXAM: CT HEAD WITHOUT CONTRAST CT CERVICAL SPINE WITHOUT CONTRAST TECHNIQUE: Multidetector CT imaging of the head and cervical spine was performed following the standard protocol without intravenous contrast. Multiplanar CT image reconstructions of  the cervical spine were also generated. COMPARISON:  July 21, 2012. FINDINGS: CT HEAD FINDINGS Brain: Mild diffuse cortical atrophy is noted. Mild chronic ischemic white matter disease is noted. No mass effect or midline shift is noted. Ventricular size is within normal limits. There is no evidence of mass lesion, hemorrhage or acute infarction. Vascular: No hyperdense vessel or unexpected calcification. Skull: Normal. Negative for fracture or focal lesion. Sinuses/Orbits: No acute finding. Other: None. CT CERVICAL SPINE FINDINGS Alignment: Normal. Skull base and vertebrae: No acute fracture. No primary bone lesion or focal pathologic process. Soft tissues and spinal canal: No prevertebral fluid or swelling. No visible canal hematoma. Disc levels:  Normal. Upper chest: Negative. Other: Degenerative changes are seen involving posterior facet joints bilaterally. IMPRESSION: 1. Mild diffuse cortical atrophy. Mild chronic ischemic white matter disease. No acute intracranial abnormality seen. 2. No significant abnormality seen in the cervical spine. Electronically Signed   By: Marijo Conception M.D.   On: 01/04/2020 12:48   CT Cervical Spine Wo Contrast  Result Date: 01/04/2020 CLINICAL DATA:  Unwitnessed fall. EXAM: CT HEAD WITHOUT CONTRAST CT CERVICAL SPINE WITHOUT CONTRAST TECHNIQUE: Multidetector CT imaging of the head and cervical spine was performed following the standard protocol without intravenous contrast. Multiplanar CT image reconstructions of the cervical spine were also generated. COMPARISON:  July 21, 2012. FINDINGS: CT HEAD FINDINGS Brain: Mild diffuse cortical atrophy is noted. Mild chronic ischemic white matter disease is noted. No mass effect or midline shift is noted. Ventricular size is within normal limits. There is no evidence of mass lesion, hemorrhage or acute infarction. Vascular: No hyperdense vessel or unexpected calcification. Skull: Normal. Negative for fracture or focal lesion.  Sinuses/Orbits: No acute finding. Other: None. CT CERVICAL SPINE FINDINGS Alignment: Normal. Skull base and vertebrae: No acute fracture. No primary bone lesion or focal pathologic process. Soft tissues and spinal canal: No prevertebral fluid or swelling. No visible canal hematoma. Disc levels:  Normal. Upper chest: Negative. Other: Degenerative changes are seen involving posterior facet joints bilaterally. IMPRESSION: 1. Mild diffuse cortical atrophy. Mild chronic ischemic white matter disease. No acute intracranial abnormality seen. 2. No significant abnormality seen in the cervical spine. Electronically Signed   By: Marijo Conception M.D.   On: 01/04/2020 12:48   DG Chest Port 1 View  Result Date: 01/04/2020 CLINICAL DATA:  Confusion EXAM: PORTABLE CHEST 1 VIEW COMPARISON:  01/13/2017 FINDINGS: The heart size and mediastinal contours are within normal limits. Calcific aortic knob. Lungs are mildly hyperexpanded. No focal airspace consolidation, pleural effusion, or pneumothorax. Vascular calcification within the axillary regions bilaterally. The visualized skeletal structures are unremarkable. IMPRESSION: No acute cardiopulmonary findings. Electronically Signed   By: Davina Poke D.O.   On: 01/04/2020 12:44    Procedures Procedures (including critical care time)  Medications Ordered in ED Medications  sodium chloride 0.9 % bolus 1,000 mL (1,000 mLs Intravenous New Bag/Given (Non-Interop) 01/04/20 1326)    ED Course  I have reviewed the triage vital signs and the nursing notes.  Pertinent labs & imaging results that were available during my care of the patient were reviewed by me and considered in my medical  decision making (see chart for details).  64 year old female presents with a fall, intermittent confusion, and difficulty walking.  She is in no acute distress and is alert and oriented on my exam.  She has intermittent tachycardia but otherwise vital signs are reassuring.  She has signs  of minor trauma on exam on her head and her left upper arm.  She has no appreciable weakness.  Will obtain labs, CT head, C-spine, chest x-ray, urine  CBC is remarkable for anemia which is down from a year ago but similar to prior values.  Her MCV is significantly higher and is 116 today.  Patient adamantly denies recent EtOH use.  She states she drinks only water, milk, tea. She denies taking ASA. CMP is remarkable for bicarb of 15.  Her anion gap is 26.  Unclear etiology.  We will add on lactate, aspirin, beta-hydroxybutyric acid. ETOH is undetectable. UA is still pending.  Beta hydroxybutyric acid is elevated likely representing ketosis.  This could be from poor p.o. intake.  She openly admits to not eating very much.  After 2 L of fluid her BMP was rechecked and anion gap is closed.  Her potassium is now 2.8.  We will send her home with potassium for the next week.  Encouraged PCP follow-up.  MDM Rules/Calculators/A&P                       Final Clinical Impression(s) / ED Diagnoses Final diagnoses:  Fall, initial encounter  Ketoacidosis  Hypokalemia    Rx / DC Orders ED Discharge Orders    None       Recardo Evangelist, PA-C 01/05/20 [place], DO 01/09/20 1505

## 2020-01-04 NOTE — ED Notes (Signed)
Pt given cran/grape juice

## 2020-01-04 NOTE — Discharge Instructions (Signed)
Take potassium supplements for the next week. Take once daily Please follow up with your doctor Return if you are worsening

## 2020-01-04 NOTE — TOC Initial Note (Addendum)
Transition of Care [facility]) - Initial/Assessment Note    Patient Details  Name: Monique Perry MRN: [id number] Date of Birth: 12/26/1955  [facility] CM/SW Contact:    Erenest Rasher, RN Phone Number:  [phone] 01/04/2020, 1:13 PM  Clinical Narrative:                 Spoke to pt at bedside. States she lives at home alone but has family that will assist as needed. She does have a RW at home but states she has problems with neuropathy in her legs. She report having [place] in the past with [facility]. Requesting [facility] for [facility].  01/04/2019 2:41 Referral sent to [facility]. Waiting confirmation.   Expected Discharge Plan: [place] Barriers to Discharge: Continued Medical Work up   Patient Goals and CMS Choice Patient states their goals for this hospitalization and ongoing recovery are:: want to continue therapy with [facility] physical therapist, [facility] CMS Medicare.gov Compare Post Acute Care list provided to:: Patient Choice offered to / list presented to : Patient  Expected Discharge Plan and Services Expected Discharge Plan: [place] In-house Referral: Clinical Social Work Discharge Planning Services: CM Consult Post Acute Care Choice: [place] arrangements for the past 2 months: [place]: PT, OT Opp Agency: [facility] [employer]) Date [place]: 01/04/20 Time [place]: 15 Representative spoke with at [place]: Burlene Arnt  Prior Living Arrangements/Services Living arrangements for the past 2 months: [place] with:: Self Patient language and need for interpreter reviewed:: Yes Do you feel safe going back to the place where you live?: Yes      Need for Family Participation in Patient Care: No (Comment) Care giver support system in place?: Yes (comment) Current home services: DME(rolling walker x 2,  elevated toilet seat, bath bench, cane) Criminal Activity/Legal Involvement Pertinent to Current Situation/Hospitalization: No - Comment as needed  Activities of Daily Living      Permission Sought/Granted Permission sought to share information with : Case Manager, PCP, Family Supports Permission granted to share information with : Yes, Verbal Permission Granted  Share Information with NAME: Lawrence Santiago  Permission granted to share info w AGENCY: [facility] [employer] granted to share info w Relationship: sister  Permission granted to share info w Contact Information: [phone]  Emotional Assessment Appearance:: Appears stated age Attitude/Demeanor/Rapport: Gracious Affect (typically observed): Accepting Orientation: : Oriented to Place, Oriented to Self, Oriented to  Time, Oriented to Situation   Psych Involvement: No (comment)  Admission diagnosis:  fall confusion Patient Active Problem List   Diagnosis Date Noted  . Peripheral neuropathy 11/29/2018  . Hypercalcemia 01/18/2017  . Splenic rupture 07/15/2016  . Serrated adenoma of cecum s/p polypectomy 2011 07/15/2016  . Neck pain on left side 11/02/2014  . Elevated transaminase level 02/14/2014  . Anemia 11/14/2013  . History of alcoholism ([place]) 11/12/2013  . Hepatitis B vaccination administered at current visit 11/12/2013  . Knee pain 06/18/2013  . Right foot pain s/p fusion surgery of right foot 05/10/2013  . Fracture of metatarsal of right foot, closed 03/20/2013  . Abnormality of gait 03/20/2013  . Low back pain radiating to right leg 02/20/2013  . Chronic back pain 02/08/2013  .  Atherosclerotic peripheral vascular disease with intermittent claudication ([place]) 02/08/2013  . Carotid bruit 02/08/2013  . High blood pressure 02/08/2013  . LSIL (low grade squamous intraepithelial lesion) on Pap smear 12/14/2012  . Osteopenia 10/02/2012  . Hyperlipidemia 08/31/2012  . Depression 08/31/2012  . left nose skin  lesion 08/31/2012  . Drug abuse, history of   . Bipolar disorder ([place])   . Collagenous colitis   . Medically noncompliant 07/26/2012  . GERD (gastroesophageal reflux disease) 07/21/2012  . Tobacco abuse 07/21/2012   PCP:  Kathyrn Lass, MD Pharmacy:   [id number] - , [place] [address] Phone: [phone] Fax: [phone]     Social Determinants of Health (SDOH) Interventions    Readmission Risk Interventions No flowsheet data found.

## 2020-01-09 DIAGNOSIS — I739 Peripheral vascular disease, unspecified: Secondary | ICD-10-CM | POA: Diagnosis not present

## 2020-01-09 DIAGNOSIS — M8580 Other specified disorders of bone density and structure, unspecified site: Secondary | ICD-10-CM | POA: Diagnosis not present

## 2020-01-09 DIAGNOSIS — D649 Anemia, unspecified: Secondary | ICD-10-CM | POA: Diagnosis not present

## 2020-01-09 DIAGNOSIS — W19XXXD Unspecified fall, subsequent encounter: Secondary | ICD-10-CM | POA: Diagnosis not present

## 2020-01-09 DIAGNOSIS — M549 Dorsalgia, unspecified: Secondary | ICD-10-CM | POA: Diagnosis not present

## 2020-01-09 DIAGNOSIS — E876 Hypokalemia: Secondary | ICD-10-CM | POA: Diagnosis not present

## 2020-01-09 DIAGNOSIS — G8929 Other chronic pain: Secondary | ICD-10-CM | POA: Diagnosis not present

## 2020-01-09 DIAGNOSIS — G629 Polyneuropathy, unspecified: Secondary | ICD-10-CM | POA: Diagnosis not present

## 2020-01-09 DIAGNOSIS — I10 Essential (primary) hypertension: Secondary | ICD-10-CM | POA: Diagnosis not present

## 2020-01-09 DIAGNOSIS — M1990 Unspecified osteoarthritis, unspecified site: Secondary | ICD-10-CM | POA: Diagnosis not present

## 2020-01-09 DIAGNOSIS — S0093XD Contusion of unspecified part of head, subsequent encounter: Secondary | ICD-10-CM | POA: Diagnosis not present

## 2020-01-10 DIAGNOSIS — E876 Hypokalemia: Secondary | ICD-10-CM | POA: Diagnosis not present

## 2020-01-10 DIAGNOSIS — I10 Essential (primary) hypertension: Secondary | ICD-10-CM | POA: Diagnosis not present

## 2020-01-10 DIAGNOSIS — G629 Polyneuropathy, unspecified: Secondary | ICD-10-CM | POA: Diagnosis not present

## 2020-01-10 DIAGNOSIS — I739 Peripheral vascular disease, unspecified: Secondary | ICD-10-CM | POA: Diagnosis not present

## 2020-01-10 DIAGNOSIS — M8580 Other specified disorders of bone density and structure, unspecified site: Secondary | ICD-10-CM | POA: Diagnosis not present

## 2020-01-10 DIAGNOSIS — G8929 Other chronic pain: Secondary | ICD-10-CM | POA: Diagnosis not present

## 2020-01-10 DIAGNOSIS — M549 Dorsalgia, unspecified: Secondary | ICD-10-CM | POA: Diagnosis not present

## 2020-01-10 DIAGNOSIS — D649 Anemia, unspecified: Secondary | ICD-10-CM | POA: Diagnosis not present

## 2020-01-10 DIAGNOSIS — M1990 Unspecified osteoarthritis, unspecified site: Secondary | ICD-10-CM | POA: Diagnosis not present

## 2020-01-17 DIAGNOSIS — M8580 Other specified disorders of bone density and structure, unspecified site: Secondary | ICD-10-CM | POA: Diagnosis not present

## 2020-01-17 DIAGNOSIS — M549 Dorsalgia, unspecified: Secondary | ICD-10-CM | POA: Diagnosis not present

## 2020-01-17 DIAGNOSIS — I10 Essential (primary) hypertension: Secondary | ICD-10-CM | POA: Diagnosis not present

## 2020-01-17 DIAGNOSIS — I739 Peripheral vascular disease, unspecified: Secondary | ICD-10-CM | POA: Diagnosis not present

## 2020-01-17 DIAGNOSIS — G8929 Other chronic pain: Secondary | ICD-10-CM | POA: Diagnosis not present

## 2020-01-17 DIAGNOSIS — D649 Anemia, unspecified: Secondary | ICD-10-CM | POA: Diagnosis not present

## 2020-01-17 DIAGNOSIS — E876 Hypokalemia: Secondary | ICD-10-CM | POA: Diagnosis not present

## 2020-01-17 DIAGNOSIS — M1990 Unspecified osteoarthritis, unspecified site: Secondary | ICD-10-CM | POA: Diagnosis not present

## 2020-01-17 DIAGNOSIS — G629 Polyneuropathy, unspecified: Secondary | ICD-10-CM | POA: Diagnosis not present

## 2020-01-18 DIAGNOSIS — E876 Hypokalemia: Secondary | ICD-10-CM | POA: Diagnosis not present

## 2020-01-18 DIAGNOSIS — M549 Dorsalgia, unspecified: Secondary | ICD-10-CM | POA: Diagnosis not present

## 2020-01-18 DIAGNOSIS — I10 Essential (primary) hypertension: Secondary | ICD-10-CM | POA: Diagnosis not present

## 2020-01-18 DIAGNOSIS — I739 Peripheral vascular disease, unspecified: Secondary | ICD-10-CM | POA: Diagnosis not present

## 2020-01-18 DIAGNOSIS — M1990 Unspecified osteoarthritis, unspecified site: Secondary | ICD-10-CM | POA: Diagnosis not present

## 2020-01-18 DIAGNOSIS — D649 Anemia, unspecified: Secondary | ICD-10-CM | POA: Diagnosis not present

## 2020-01-18 DIAGNOSIS — G8929 Other chronic pain: Secondary | ICD-10-CM | POA: Diagnosis not present

## 2020-01-18 DIAGNOSIS — G629 Polyneuropathy, unspecified: Secondary | ICD-10-CM | POA: Diagnosis not present

## 2020-01-18 DIAGNOSIS — M8580 Other specified disorders of bone density and structure, unspecified site: Secondary | ICD-10-CM | POA: Diagnosis not present

## 2020-01-22 DIAGNOSIS — I10 Essential (primary) hypertension: Secondary | ICD-10-CM | POA: Diagnosis not present

## 2020-01-22 DIAGNOSIS — M8580 Other specified disorders of bone density and structure, unspecified site: Secondary | ICD-10-CM | POA: Diagnosis not present

## 2020-01-22 DIAGNOSIS — G629 Polyneuropathy, unspecified: Secondary | ICD-10-CM | POA: Diagnosis not present

## 2020-01-22 DIAGNOSIS — D649 Anemia, unspecified: Secondary | ICD-10-CM | POA: Diagnosis not present

## 2020-01-22 DIAGNOSIS — G8929 Other chronic pain: Secondary | ICD-10-CM | POA: Diagnosis not present

## 2020-01-22 DIAGNOSIS — M549 Dorsalgia, unspecified: Secondary | ICD-10-CM | POA: Diagnosis not present

## 2020-01-22 DIAGNOSIS — I739 Peripheral vascular disease, unspecified: Secondary | ICD-10-CM | POA: Diagnosis not present

## 2020-01-22 DIAGNOSIS — E876 Hypokalemia: Secondary | ICD-10-CM | POA: Diagnosis not present

## 2020-01-22 DIAGNOSIS — M1990 Unspecified osteoarthritis, unspecified site: Secondary | ICD-10-CM | POA: Diagnosis not present

## 2020-01-24 DIAGNOSIS — I10 Essential (primary) hypertension: Secondary | ICD-10-CM | POA: Diagnosis not present

## 2020-01-24 DIAGNOSIS — G8929 Other chronic pain: Secondary | ICD-10-CM | POA: Diagnosis not present

## 2020-01-24 DIAGNOSIS — E876 Hypokalemia: Secondary | ICD-10-CM | POA: Diagnosis not present

## 2020-01-24 DIAGNOSIS — M1990 Unspecified osteoarthritis, unspecified site: Secondary | ICD-10-CM | POA: Diagnosis not present

## 2020-01-24 DIAGNOSIS — G629 Polyneuropathy, unspecified: Secondary | ICD-10-CM | POA: Diagnosis not present

## 2020-01-24 DIAGNOSIS — M549 Dorsalgia, unspecified: Secondary | ICD-10-CM | POA: Diagnosis not present

## 2020-01-24 DIAGNOSIS — I739 Peripheral vascular disease, unspecified: Secondary | ICD-10-CM | POA: Diagnosis not present

## 2020-01-24 DIAGNOSIS — D649 Anemia, unspecified: Secondary | ICD-10-CM | POA: Diagnosis not present

## 2020-01-24 DIAGNOSIS — M8580 Other specified disorders of bone density and structure, unspecified site: Secondary | ICD-10-CM | POA: Diagnosis not present

## 2020-01-29 DIAGNOSIS — G629 Polyneuropathy, unspecified: Secondary | ICD-10-CM | POA: Diagnosis not present

## 2020-01-29 DIAGNOSIS — M8580 Other specified disorders of bone density and structure, unspecified site: Secondary | ICD-10-CM | POA: Diagnosis not present

## 2020-01-29 DIAGNOSIS — M549 Dorsalgia, unspecified: Secondary | ICD-10-CM | POA: Diagnosis not present

## 2020-01-29 DIAGNOSIS — D649 Anemia, unspecified: Secondary | ICD-10-CM | POA: Diagnosis not present

## 2020-01-29 DIAGNOSIS — M1990 Unspecified osteoarthritis, unspecified site: Secondary | ICD-10-CM | POA: Diagnosis not present

## 2020-01-29 DIAGNOSIS — I739 Peripheral vascular disease, unspecified: Secondary | ICD-10-CM | POA: Diagnosis not present

## 2020-01-29 DIAGNOSIS — E876 Hypokalemia: Secondary | ICD-10-CM | POA: Diagnosis not present

## 2020-01-29 DIAGNOSIS — I10 Essential (primary) hypertension: Secondary | ICD-10-CM | POA: Diagnosis not present

## 2020-01-29 DIAGNOSIS — G8929 Other chronic pain: Secondary | ICD-10-CM | POA: Diagnosis not present

## 2020-01-31 DIAGNOSIS — M1990 Unspecified osteoarthritis, unspecified site: Secondary | ICD-10-CM | POA: Diagnosis not present

## 2020-01-31 DIAGNOSIS — M8580 Other specified disorders of bone density and structure, unspecified site: Secondary | ICD-10-CM | POA: Diagnosis not present

## 2020-01-31 DIAGNOSIS — D649 Anemia, unspecified: Secondary | ICD-10-CM | POA: Diagnosis not present

## 2020-01-31 DIAGNOSIS — G8929 Other chronic pain: Secondary | ICD-10-CM | POA: Diagnosis not present

## 2020-01-31 DIAGNOSIS — I739 Peripheral vascular disease, unspecified: Secondary | ICD-10-CM | POA: Diagnosis not present

## 2020-01-31 DIAGNOSIS — M549 Dorsalgia, unspecified: Secondary | ICD-10-CM | POA: Diagnosis not present

## 2020-01-31 DIAGNOSIS — E876 Hypokalemia: Secondary | ICD-10-CM | POA: Diagnosis not present

## 2020-01-31 DIAGNOSIS — I10 Essential (primary) hypertension: Secondary | ICD-10-CM | POA: Diagnosis not present

## 2020-01-31 DIAGNOSIS — G629 Polyneuropathy, unspecified: Secondary | ICD-10-CM | POA: Diagnosis not present

## 2020-02-05 DIAGNOSIS — E876 Hypokalemia: Secondary | ICD-10-CM | POA: Diagnosis not present

## 2020-02-05 DIAGNOSIS — I10 Essential (primary) hypertension: Secondary | ICD-10-CM | POA: Diagnosis not present

## 2020-02-05 DIAGNOSIS — G629 Polyneuropathy, unspecified: Secondary | ICD-10-CM | POA: Diagnosis not present

## 2020-02-05 DIAGNOSIS — M1990 Unspecified osteoarthritis, unspecified site: Secondary | ICD-10-CM | POA: Diagnosis not present

## 2020-02-05 DIAGNOSIS — D649 Anemia, unspecified: Secondary | ICD-10-CM | POA: Diagnosis not present

## 2020-02-05 DIAGNOSIS — M8580 Other specified disorders of bone density and structure, unspecified site: Secondary | ICD-10-CM | POA: Diagnosis not present

## 2020-02-05 DIAGNOSIS — I739 Peripheral vascular disease, unspecified: Secondary | ICD-10-CM | POA: Diagnosis not present

## 2020-02-05 DIAGNOSIS — M549 Dorsalgia, unspecified: Secondary | ICD-10-CM | POA: Diagnosis not present

## 2020-02-05 DIAGNOSIS — G8929 Other chronic pain: Secondary | ICD-10-CM | POA: Diagnosis not present

## 2020-02-08 DIAGNOSIS — E876 Hypokalemia: Secondary | ICD-10-CM | POA: Diagnosis not present

## 2020-02-08 DIAGNOSIS — M549 Dorsalgia, unspecified: Secondary | ICD-10-CM | POA: Diagnosis not present

## 2020-02-08 DIAGNOSIS — M1990 Unspecified osteoarthritis, unspecified site: Secondary | ICD-10-CM | POA: Diagnosis not present

## 2020-02-08 DIAGNOSIS — G629 Polyneuropathy, unspecified: Secondary | ICD-10-CM | POA: Diagnosis not present

## 2020-02-08 DIAGNOSIS — I10 Essential (primary) hypertension: Secondary | ICD-10-CM | POA: Diagnosis not present

## 2020-02-08 DIAGNOSIS — I739 Peripheral vascular disease, unspecified: Secondary | ICD-10-CM | POA: Diagnosis not present

## 2020-02-08 DIAGNOSIS — M8580 Other specified disorders of bone density and structure, unspecified site: Secondary | ICD-10-CM | POA: Diagnosis not present

## 2020-02-08 DIAGNOSIS — D649 Anemia, unspecified: Secondary | ICD-10-CM | POA: Diagnosis not present

## 2020-02-08 DIAGNOSIS — G8929 Other chronic pain: Secondary | ICD-10-CM | POA: Diagnosis not present

## 2020-02-11 DIAGNOSIS — G629 Polyneuropathy, unspecified: Secondary | ICD-10-CM | POA: Diagnosis not present

## 2020-02-11 DIAGNOSIS — G8929 Other chronic pain: Secondary | ICD-10-CM | POA: Diagnosis not present

## 2020-02-11 DIAGNOSIS — I739 Peripheral vascular disease, unspecified: Secondary | ICD-10-CM | POA: Diagnosis not present

## 2020-02-11 DIAGNOSIS — E876 Hypokalemia: Secondary | ICD-10-CM | POA: Diagnosis not present

## 2020-02-11 DIAGNOSIS — M549 Dorsalgia, unspecified: Secondary | ICD-10-CM | POA: Diagnosis not present

## 2020-02-11 DIAGNOSIS — D649 Anemia, unspecified: Secondary | ICD-10-CM | POA: Diagnosis not present

## 2020-02-11 DIAGNOSIS — M8580 Other specified disorders of bone density and structure, unspecified site: Secondary | ICD-10-CM | POA: Diagnosis not present

## 2020-02-11 DIAGNOSIS — M1990 Unspecified osteoarthritis, unspecified site: Secondary | ICD-10-CM | POA: Diagnosis not present

## 2020-02-11 DIAGNOSIS — I10 Essential (primary) hypertension: Secondary | ICD-10-CM | POA: Diagnosis not present

## 2020-06-04 DIAGNOSIS — I700 Atherosclerosis of aorta: Secondary | ICD-10-CM | POA: Diagnosis not present

## 2020-06-04 DIAGNOSIS — G603 Idiopathic progressive neuropathy: Secondary | ICD-10-CM | POA: Diagnosis not present

## 2020-06-04 DIAGNOSIS — G4700 Insomnia, unspecified: Secondary | ICD-10-CM | POA: Diagnosis not present

## 2020-06-04 DIAGNOSIS — Z720 Tobacco use: Secondary | ICD-10-CM | POA: Diagnosis not present

## 2020-06-04 DIAGNOSIS — R296 Repeated falls: Secondary | ICD-10-CM | POA: Diagnosis not present

## 2020-06-04 DIAGNOSIS — M859 Disorder of bone density and structure, unspecified: Secondary | ICD-10-CM | POA: Diagnosis not present

## 2020-06-04 DIAGNOSIS — Z0000 Encounter for general adult medical examination without abnormal findings: Secondary | ICD-10-CM | POA: Diagnosis not present

## 2020-06-04 DIAGNOSIS — E559 Vitamin D deficiency, unspecified: Secondary | ICD-10-CM | POA: Diagnosis not present

## 2020-06-04 DIAGNOSIS — Z23 Encounter for immunization: Secondary | ICD-10-CM | POA: Diagnosis not present

## 2020-06-04 DIAGNOSIS — G629 Polyneuropathy, unspecified: Secondary | ICD-10-CM | POA: Diagnosis not present

## 2020-06-04 DIAGNOSIS — Z1211 Encounter for screening for malignant neoplasm of colon: Secondary | ICD-10-CM | POA: Diagnosis not present

## 2020-06-04 DIAGNOSIS — D126 Benign neoplasm of colon, unspecified: Secondary | ICD-10-CM | POA: Diagnosis not present

## 2020-06-10 ENCOUNTER — Encounter (HOSPITAL_COMMUNITY): Payer: Self-pay

## 2020-06-10 ENCOUNTER — Emergency Department (HOSPITAL_COMMUNITY)
Admission: EM | Admit: 2020-06-10 | Discharge: 2020-06-11 | Disposition: A | Discharge status: Home / Self Care | Payer: Medicare HMO | Attending: Emergency Medicine | Admitting: Emergency Medicine

## 2020-06-10 ENCOUNTER — Emergency Department (HOSPITAL_COMMUNITY): Payer: Medicare HMO

## 2020-06-10 DIAGNOSIS — S99911A Unspecified injury of right ankle, initial encounter: Secondary | ICD-10-CM | POA: Diagnosis present

## 2020-06-10 DIAGNOSIS — Y939 Activity, unspecified: Secondary | ICD-10-CM | POA: Diagnosis not present

## 2020-06-10 DIAGNOSIS — M7989 Other specified soft tissue disorders: Secondary | ICD-10-CM | POA: Diagnosis not present

## 2020-06-10 DIAGNOSIS — F17210 Nicotine dependence, cigarettes, uncomplicated: Secondary | ICD-10-CM | POA: Diagnosis not present

## 2020-06-10 DIAGNOSIS — R52 Pain, unspecified: Secondary | ICD-10-CM | POA: Diagnosis not present

## 2020-06-10 DIAGNOSIS — Y929 Unspecified place or not applicable: Secondary | ICD-10-CM | POA: Insufficient documentation

## 2020-06-10 DIAGNOSIS — S8261XA Displaced fracture of lateral malleolus of right fibula, initial encounter for closed fracture: Secondary | ICD-10-CM | POA: Diagnosis not present

## 2020-06-10 DIAGNOSIS — Y999 Unspecified external cause status: Secondary | ICD-10-CM | POA: Insufficient documentation

## 2020-06-10 DIAGNOSIS — I959 Hypotension, unspecified: Secondary | ICD-10-CM | POA: Diagnosis not present

## 2020-06-10 DIAGNOSIS — S82899A Other fracture of unspecified lower leg, initial encounter for closed fracture: Secondary | ICD-10-CM

## 2020-06-10 DIAGNOSIS — S8291XA Unspecified fracture of right lower leg, initial encounter for closed fracture: Secondary | ICD-10-CM | POA: Diagnosis not present

## 2020-06-10 DIAGNOSIS — W010XXA Fall on same level from slipping, tripping and stumbling without subsequent striking against object, initial encounter: Secondary | ICD-10-CM | POA: Insufficient documentation

## 2020-06-10 DIAGNOSIS — R0902 Hypoxemia: Secondary | ICD-10-CM | POA: Diagnosis not present

## 2020-06-10 DIAGNOSIS — M25471 Effusion, right ankle: Secondary | ICD-10-CM | POA: Diagnosis not present

## 2020-06-10 DIAGNOSIS — S82391A Other fracture of lower end of right tibia, initial encounter for closed fracture: Secondary | ICD-10-CM | POA: Diagnosis not present

## 2020-06-10 DIAGNOSIS — W19XXXA Unspecified fall, initial encounter: Secondary | ICD-10-CM | POA: Diagnosis not present

## 2020-06-10 NOTE — ED Triage Notes (Signed)
Pt BIBA from home. Pt had mechanical fall in kitchen. C/o right sided ankle pain. No obvious deformities. Pt denies ETOH, but smells of alcohol.

## 2020-06-11 MED ORDER — HYDROCODONE-ACETAMINOPHEN 5-325 MG PO TABS: 1.0000 | ORAL_TABLET | Freq: Four times a day (QID) | ORAL | 0 refills | Status: DC | PRN

## 2020-06-11 NOTE — Discharge Instructions (Signed)
Please follow-up with the orthopedist in 1 week. Do not bear any weight.

## 2020-06-11 NOTE — ED Provider Notes (Signed)
[facility] DEPT Provider Note   CSN: [id number] Arrival date & time: 06/10/20  1656     History Chief Complaint  Patient presents with  . Fall  . Ankle Pain  . Alcohol Intoxication    Monique Perry is a 64 y.o. female.  HPI    64 year old female comes in a chief complaint of fall.  Patient reports that she had a mechanical fall in her kitchen, when she lost balance while opening up her fridge.  She denies any alcohol intoxication.  She is complaining of pain in her right ankle.  She denies any pain in her head, neck, back, abdomen, chest.  She denies any shortness of breath and is not taking any blood thinners.  Past Medical History:  Diagnosis Date  . Alcohol abuse last February 2012  . Arthritis   . Bipolar disorder ([place])   . Collagenous colitis   . Drug abuse ([place]) last 1990's  . Peripheral neuropathy 11/29/2018    Patient Active Problem List   Diagnosis Date Noted  . Peripheral neuropathy 11/29/2018  . Hypercalcemia 01/18/2017  . Splenic rupture 07/15/2016  . Serrated adenoma of cecum s/p polypectomy 2011 07/15/2016  . Neck pain on left side 11/02/2014  . Elevated transaminase level 02/14/2014  . Anemia 11/14/2013  . History of alcoholism ([place]) 11/12/2013  . Hepatitis B vaccination administered at current visit 11/12/2013  . Knee pain 06/18/2013  . Right foot pain s/p fusion surgery of right foot 05/10/2013  . Fracture of metatarsal of right foot, closed 03/20/2013  . Abnormality of gait 03/20/2013  . Low back pain radiating to right leg 02/20/2013  . Chronic back pain 02/08/2013  . Atherosclerotic peripheral vascular disease with intermittent claudication ([place]) 02/08/2013  . Carotid bruit 02/08/2013  . High blood pressure 02/08/2013  . LSIL (low grade squamous intraepithelial lesion) on Pap smear 12/14/2012  . Osteopenia 10/02/2012  . Hyperlipidemia 08/31/2012  . Depression 08/31/2012  . left nose skin lesion 08/31/2012   . Drug abuse, history of   . Bipolar disorder ([place])   . Collagenous colitis   . Medically noncompliant 07/26/2012  . GERD (gastroesophageal reflux disease) 07/21/2012  . Tobacco abuse 07/21/2012    Past Surgical History:  Procedure Laterality Date  . ABDOMINAL HYSTERECTOMY  Age 46 years ago   partial   . FOOT SURGERY    . IR GENERIC HISTORICAL  07/16/2016   IR US GUIDE VASC ACCESS RIGHT 07/16/2016 Arne Cleveland, MD [facility]  . IR GENERIC HISTORICAL  07/16/2016   IR ANGIOGRAM VISCERAL SELECTIVE 07/16/2016 Arne Cleveland, MD [facility]  . IR GENERIC HISTORICAL  07/16/2016   IR ANGIOGRAM SELECTIVE EACH ADDITIONAL VESSEL 07/16/2016 Arne Cleveland, MD [facility]  . IR GENERIC HISTORICAL  07/16/2016   IR EMBO ART  VEN HEMORR LYMPH EXTRAV  INC GUIDE ROADMAPPING 07/16/2016 Arne Cleveland, MD [facility]  . IR GENERIC HISTORICAL  07/16/2016   IR ANGIOGRAM FOLLOW UP STUDY 07/16/2016 Arne Cleveland, MD [facility]  . Lapidus fusion Right 04/17/2012  . NOSE SURGERY    . OSTEOTOMY Right 04/17/2012   Rt #2 Metatarsal  . TONSILLECTOMY  As a child   . WRIST SURGERY       OB History    Gravida  3   Para  0   Term      Preterm      AB  3   Living  0     SAB  2   TAB  1   Ectopic  0   Multiple  0   Live Births           Obstetric Comments  Miscarriage due to drug abuse.        Family History  Problem Relation Age of Onset  . Stroke Mother   . Diabetes Father   . Hypertension Father   . Hyperlipidemia Father   . Breast cancer Sister     Social History   Tobacco Use  . Smoking status: Current Some Day Smoker    Packs/day: 0.75    Years: 45.00    Pack years: 33.75    Types: Cigarettes, Cigars  . Smokeless tobacco: Never Used  . Tobacco comment: smoking cig for 35 years. continues to smoke 1/2 pack daily  Substance Use Topics  . Alcohol use: Yes    Alcohol/week: 0.0 standard drinks    Comment: ETOH abuse for 25 year, 1/2 gallon Liquor daily. admits 6 weeks  course of drinking ETOH as her only intake in 2011. she states that she quits her drinnking since Feb 2012.  . Drug use: No    Comment: pots before, clean since [place] Medications Prior to Admission medications   Medication Sig Start Date End Date Taking? Authorizing Provider  gabapentin (NEURONTIN) 600 MG tablet TAKE 1 TABLET BY MOUTH THREE TIMES A DAY Patient not taking: Reported on 01/04/2020 01/15/19   Kathrynn Ducking, MD  HYDROcodone-acetaminophen (NORCO/VICODIN) 5-325 MG tablet Take 1 tablet by mouth every 6 (six) hours as needed. 06/11/20   Varney Biles, MD  mirtazapine (REMERON) 15 MG tablet Take 15 mg by mouth at bedtime. 12/14/19   [provider]  ondansetron (ZOFRAN ODT) 4 MG disintegrating tablet 4mg  ODT q4 hours prn nausea/vomit Patient not taking: Reported on 01/04/2020 01/08/19   Elnora Morrison, MD  potassium chloride SA (KLOR-CON) 20 MEQ tablet Take 2 tablets (40 mEq total) by mouth daily. 01/04/20   Recardo Evangelist, PA-C  thiamine (VITAMIN B-1) 100 MG tablet Take 1 tablet (100 mg total) by mouth daily. Patient not taking: Reported on 01/04/2020 12/21/18   Kathrynn Ducking, MD    Allergies    Erythrocin, Sulfa antibiotics, Trazodone and nefazodone, and Tramadol  Review of Systems   Review of Systems  Constitutional: Positive for activity change.  Gastrointestinal: Negative for nausea and vomiting.  Musculoskeletal: Positive for arthralgias.  Hematological: Does not bruise/bleed easily.    Physical Exam Updated Vital Signs BP (!) 146/75 (BP Location: Right Arm)   Pulse 88   Temp 97.8 F (36.6 C) (Oral)   Resp 16   SpO2 99%   Physical Exam Vitals and nursing note reviewed.  Constitutional:      Appearance: She is well-developed.  HENT:     Head: Normocephalic and atraumatic.  Cardiovascular:     Rate and Rhythm: Normal rate.  Pulmonary:     Effort: Pulmonary effort is normal.  Abdominal:     General: Bowel sounds are normal.   Musculoskeletal:        General: Swelling and tenderness present.     Cervical back: Normal range of motion and neck supple.  Skin:    General: Skin is warm and dry.  Neurological:     Mental Status: She is alert and oriented to person, place, and time.     ED Results / Procedures / Treatments   Labs (all labs ordered are listed, but only abnormal results are displayed) Labs Reviewed -  No data to display  EKG None  Radiology DG Ankle Complete Right  Result Date: 06/10/2020 CLINICAL DATA:  Pain following fall EXAM: RIGHT ANKLE - COMPLETE 3+ VIEW COMPARISON:  November 04, 2017 FINDINGS: Frontal, oblique, and lateral views were obtained. There is marked soft tissue swelling laterally. There is a fracture of the distal fibula at the proximal lateral malleolar level with alignment near anatomic. There is a spiral fracture of the distal tibial diaphysis with fracture fragments in near anatomic alignment. There is a small joint effusion. There is no appreciable joint space narrowing or erosion. Postoperative changes noted in the proximal first metatarsal. Ankle mortise appears grossly intact. IMPRESSION: Spiral fracture distal tibial diaphysis with alignment near anatomic. Essentially nondisplaced fracture of the distal fibula at the proximal lateral malleolar level. Small joint effusion. Marked soft tissue swelling laterally. Ankle mortise appears grossly intact. Postoperative change proximal first metatarsal. Electronically Signed   By: Lowella Grip III M.D.   On: 06/10/2020 19:07    Procedures Procedures (including critical care time)  Medications Ordered in ED Medications - No data to display  ED Course  I have reviewed the triage vital signs and the nursing notes.  Pertinent labs & imaging results that were available during my care of the patient were reviewed by me and considered in my medical decision making (see chart for details).    MDM Rules/Calculators/A&P                           64 year old female comes in a chief complaint of fall.  On exam she is noted to have right ankle edema with tenderness.  She has significant swelling over the lateral aspect of the ankle.  X-ray indicates likely bimalleolar fracture.  At this time she is not significantly deformed over the ankle.  Patient will be placed in a splint with orthopedic follow-up in 1 week.  Nonweightbearing status stressed.  Neuro vascularly intact at the time of discharge.  Final Clinical Impression(s) / ED Diagnoses Final diagnoses:  Closed fracture of ankle, unspecified laterality, initial encounter    Rx / DC Orders ED Discharge Orders         Ordered    HYDROcodone-acetaminophen (NORCO/VICODIN) 5-325 MG tablet  Every 6 hours PRN     Discontinue  Reprint     06/11/20 0029           Varney Biles, MD 06/11/20 0050

## 2020-06-13 ENCOUNTER — Other Ambulatory Visit: Payer: Self-pay

## 2020-06-13 ENCOUNTER — Other Ambulatory Visit (HOSPITAL_COMMUNITY)
Admission: RE | Admit: 2020-06-13 | Discharge: 2020-06-13 | Disposition: A | Discharge status: Home / Self Care | Payer: Medicare HMO | Source: Ambulatory Visit | Attending: Orthopaedic Surgery | Admitting: Orthopaedic Surgery

## 2020-06-13 ENCOUNTER — Other Ambulatory Visit: Payer: Self-pay | Admitting: Orthopaedic Surgery

## 2020-06-13 ENCOUNTER — Encounter (HOSPITAL_COMMUNITY): Payer: Self-pay | Admitting: Orthopaedic Surgery

## 2020-06-13 DIAGNOSIS — Z01812 Encounter for preprocedural laboratory examination: Secondary | ICD-10-CM | POA: Diagnosis not present

## 2020-06-13 DIAGNOSIS — Z20822 Contact with and (suspected) exposure to covid-19: Secondary | ICD-10-CM | POA: Diagnosis not present

## 2020-06-13 DIAGNOSIS — S8251XA Displaced fracture of medial malleolus of right tibia, initial encounter for closed fracture: Secondary | ICD-10-CM | POA: Diagnosis not present

## 2020-06-13 LAB — SARS CORONAVIRUS 2 (TAT 6-24 HRS): SARS Coronavirus 2: NEGATIVE

## 2020-06-13 NOTE — Progress Notes (Signed)
Spoke with pt for pre-op call. Pt denies cardiac history, HTN or Diabetes.   Covid test done today. Pt understands that she needs to quarantine until she comes to the hospital on Tuesday.   ERAS protocol ordered. Pt unable to come by hospital and pick up a Pre-surgery Ensure due to her ankle injury. Instructed pt to not eat food after midnight Monday. But, may have clear liquids until 4:30 AM. Reviewed list of clear liquids with pt.

## 2020-06-16 NOTE — Anesthesia Preprocedure Evaluation (Addendum)
Anesthesia Evaluation  Patient identified by MRN, date of birth, ID band Patient awake    Reviewed: Allergy & Precautions, NPO status , Patient's Chart, lab work & pertinent test results  Airway Mallampati: II  TM Distance: >3 FB Neck ROM: Full    Dental  (+) Partial Upper, Missing, Dental Advisory Given, Caps, Implants   Pulmonary pneumonia, Current Smoker and Patient abstained from smoking.,    Pulmonary exam normal breath sounds clear to auscultation       Cardiovascular hypertension, + Peripheral Vascular Disease  Normal cardiovascular exam Rhythm:Regular Rate:Normal     Neuro/Psych PSYCHIATRIC DISORDERS Depression Bipolar Disorder  Neuromuscular disease    GI/Hepatic GERD  ,(+)     substance abuse  alcohol use,   Endo/Other  negative endocrine ROS  Renal/GU negative Renal ROS     Musculoskeletal  (+) Arthritis ,   Abdominal   Peds  Hematology  (+) Blood dyscrasia, anemia ,   Anesthesia Other Findings   Reproductive/Obstetrics                            Anesthesia Physical Anesthesia Plan  ASA: III  Anesthesia Plan: General   Post-op Pain Management: GA combined w/ Regional for post-op pain   Induction: Intravenous  PONV Risk Score and Plan: 2 and Ondansetron, Dexamethasone, Treatment may vary due to age or medical condition and Midazolam  Airway Management Planned: Oral ETT and LMA  Additional Equipment: None  Intra-op Plan:   Post-operative Plan: Extubation in OR  Informed Consent: I have reviewed the patients History and Physical, chart, labs and discussed the procedure including the risks, benefits and alternatives for the proposed anesthesia with the patient or authorized representative who has indicated his/her understanding and acceptance.     Dental advisory given  Plan Discussed with: CRNA  Anesthesia Plan Comments:        Anesthesia Quick  Evaluation

## 2020-06-17 ENCOUNTER — Ambulatory Visit (HOSPITAL_COMMUNITY): Payer: Medicare HMO | Admitting: Certified Registered"

## 2020-06-17 ENCOUNTER — Observation Stay (HOSPITAL_COMMUNITY): Payer: Medicare HMO

## 2020-06-17 ENCOUNTER — Observation Stay (HOSPITAL_COMMUNITY)
Admission: RE | Admit: 2020-06-17 | Discharge: 2020-06-23 | Disposition: A | Discharge status: Home / Self Care | Payer: Medicare HMO | Attending: Orthopaedic Surgery | Admitting: Orthopaedic Surgery

## 2020-06-17 ENCOUNTER — Encounter (HOSPITAL_COMMUNITY): Payer: Self-pay | Admitting: Orthopaedic Surgery

## 2020-06-17 ENCOUNTER — Other Ambulatory Visit: Payer: Self-pay

## 2020-06-17 ENCOUNTER — Encounter (HOSPITAL_COMMUNITY)
Admission: RE | Disposition: A | Discharge status: Home / Self Care | Payer: Self-pay | Source: Home / Self Care | Attending: Orthopaedic Surgery

## 2020-06-17 DIAGNOSIS — W19XXXA Unspecified fall, initial encounter: Secondary | ICD-10-CM | POA: Diagnosis not present

## 2020-06-17 DIAGNOSIS — S82831A Other fracture of upper and lower end of right fibula, initial encounter for closed fracture: Secondary | ICD-10-CM | POA: Diagnosis not present

## 2020-06-17 DIAGNOSIS — Y939 Activity, unspecified: Secondary | ICD-10-CM | POA: Insufficient documentation

## 2020-06-17 DIAGNOSIS — S8261XA Displaced fracture of lateral malleolus of right fibula, initial encounter for closed fracture: Secondary | ICD-10-CM | POA: Diagnosis not present

## 2020-06-17 DIAGNOSIS — S82391A Other fracture of lower end of right tibia, initial encounter for closed fracture: Secondary | ICD-10-CM | POA: Diagnosis not present

## 2020-06-17 DIAGNOSIS — Y929 Unspecified place or not applicable: Secondary | ICD-10-CM | POA: Diagnosis not present

## 2020-06-17 DIAGNOSIS — S89391A Other physeal fracture of lower end of right fibula, initial encounter for closed fracture: Secondary | ICD-10-CM | POA: Diagnosis not present

## 2020-06-17 DIAGNOSIS — G8918 Other acute postprocedural pain: Secondary | ICD-10-CM | POA: Diagnosis not present

## 2020-06-17 DIAGNOSIS — Y999 Unspecified external cause status: Secondary | ICD-10-CM | POA: Insufficient documentation

## 2020-06-17 DIAGNOSIS — G9009 Other idiopathic peripheral autonomic neuropathy: Secondary | ICD-10-CM | POA: Diagnosis not present

## 2020-06-17 DIAGNOSIS — S82251A Displaced comminuted fracture of shaft of right tibia, initial encounter for closed fracture: Secondary | ICD-10-CM | POA: Diagnosis not present

## 2020-06-17 DIAGNOSIS — Z79899 Other long term (current) drug therapy: Secondary | ICD-10-CM | POA: Insufficient documentation

## 2020-06-17 DIAGNOSIS — F319 Bipolar disorder, unspecified: Secondary | ICD-10-CM | POA: Insufficient documentation

## 2020-06-17 DIAGNOSIS — S82209A Unspecified fracture of shaft of unspecified tibia, initial encounter for closed fracture: Secondary | ICD-10-CM | POA: Diagnosis present

## 2020-06-17 DIAGNOSIS — S89191A Other physeal fracture of lower end of right tibia, initial encounter for closed fracture: Secondary | ICD-10-CM | POA: Diagnosis not present

## 2020-06-17 DIAGNOSIS — E785 Hyperlipidemia, unspecified: Secondary | ICD-10-CM | POA: Diagnosis not present

## 2020-06-17 DIAGNOSIS — S82451A Displaced comminuted fracture of shaft of right fibula, initial encounter for closed fracture: Secondary | ICD-10-CM | POA: Diagnosis not present

## 2020-06-17 DIAGNOSIS — Z419 Encounter for procedure for purposes other than remedying health state, unspecified: Secondary | ICD-10-CM

## 2020-06-17 HISTORY — DX: Pneumonia, unspecified organism: J18.9

## 2020-06-17 HISTORY — PX: ORIF FIBULA FRACTURE: SHX5114

## 2020-06-17 LAB — CBC
HCT: 31 % — ABNORMAL LOW (ref 36.0–46.0)
Hemoglobin: 10.3 g/dL — ABNORMAL LOW (ref 12.0–15.0)
MCH: 34.1 pg — ABNORMAL HIGH (ref 26.0–34.0)
MCHC: 33.2 g/dL (ref 30.0–36.0)
MCV: 102.6 fL — ABNORMAL HIGH (ref 80.0–100.0)
Platelets: 216 10*3/uL (ref 150–400)
RBC: 3.02 MIL/uL — ABNORMAL LOW (ref 3.87–5.11)
RDW: 15.9 % — ABNORMAL HIGH (ref 11.5–15.5)
WBC: 7.9 10*3/uL (ref 4.0–10.5)
nRBC: 0.3 % — ABNORMAL HIGH (ref 0.0–0.2)

## 2020-06-17 LAB — COMPREHENSIVE METABOLIC PANEL
ALT: 13 U/L (ref 0–44)
AST: 18 U/L (ref 15–41)
Albumin: 3.4 g/dL — ABNORMAL LOW (ref 3.5–5.0)
Alkaline Phosphatase: 81 U/L (ref 38–126)
Anion gap: 10 (ref 5–15)
BUN: 12 mg/dL (ref 8–23)
CO2: 25 mmol/L (ref 22–32)
Calcium: 9.3 mg/dL (ref 8.9–10.3)
Chloride: 99 mmol/L (ref 98–111)
Creatinine, Ser: 0.79 mg/dL (ref 0.44–1.00)
GFR calc Af Amer: >60 mL/min (ref >60)
GFR calc non Af Amer: >60 mL/min (ref >60)
Glucose, Bld: 116 mg/dL — ABNORMAL HIGH (ref 70–99)
Potassium: 3 mmol/L — ABNORMAL LOW (ref 3.5–5.1)
Sodium: 134 mmol/L — ABNORMAL LOW (ref 135–145)
Total Bilirubin: 0.9 mg/dL (ref 0.3–1.2)
Total Protein: 6.7 g/dL (ref 6.5–8.1)

## 2020-06-17 LAB — POCT I-STAT, CHEM 8
BUN: 13 mg/dL (ref 8–23)
Calcium, Ion: 1.21 mmol/L (ref 1.15–1.40)
Chloride: 97 mmol/L — ABNORMAL LOW (ref 98–111)
Creatinine, Ser: 0.7 mg/dL (ref 0.44–1.00)
Glucose, Bld: 112 mg/dL — ABNORMAL HIGH (ref 70–99)
HCT: 30 % — ABNORMAL LOW (ref 36.0–46.0)
Hemoglobin: 10.2 g/dL — ABNORMAL LOW (ref 12.0–15.0)
Potassium: 3 mmol/L — ABNORMAL LOW (ref 3.5–5.1)
Sodium: 137 mmol/L (ref 135–145)
TCO2: 26 mmol/L (ref 22–32)

## 2020-06-17 SURGERY — OPEN REDUCTION INTERNAL FIXATION (ORIF) FIBULA FRACTURE
Anesthesia: General | Laterality: Right

## 2020-06-17 MED ORDER — MIDAZOLAM HCL 5 MG/5ML IJ SOLN
INTRAMUSCULAR | Status: DC | PRN
  Administered 2020-06-17 (×2): 1 mg via INTRAVENOUS

## 2020-06-17 MED ORDER — DEXAMETHASONE SODIUM PHOSPHATE 4 MG/ML IJ SOLN
INTRAMUSCULAR | Status: DC | PRN
  Administered 2020-06-17: 5 mg via INTRAVENOUS
  Administered 2020-06-17: 3 mg via INTRAVENOUS

## 2020-06-17 MED ORDER — ONDANSETRON HCL 4 MG/2ML IJ SOLN
INTRAMUSCULAR | Status: AC
  Filled 2020-06-17: qty 2

## 2020-06-17 MED ORDER — ACETAMINOPHEN 325 MG PO TABS
325.0000 mg | ORAL_TABLET | Freq: Four times a day (QID) | ORAL | Status: DC | PRN
  Administered 2020-06-19 – 2020-06-21 (×2): 650 mg via ORAL
  Filled 2020-06-17 (×2): qty 2

## 2020-06-17 MED ORDER — LIDOCAINE 2% (20 MG/ML) 5 ML SYRINGE
INTRAMUSCULAR | Status: AC
  Filled 2020-06-17: qty 5

## 2020-06-17 MED ORDER — FENTANYL CITRATE (PF) 100 MCG/2ML IJ SOLN
INTRAMUSCULAR | Status: DC | PRN
  Administered 2020-06-17 (×5): 50 ug via INTRAVENOUS

## 2020-06-17 MED ORDER — MEPERIDINE HCL 25 MG/ML IJ SOLN: 6.2500 mg | INTRAMUSCULAR | Status: DC | PRN

## 2020-06-17 MED ORDER — PHENYLEPHRINE 40 MCG/ML (10ML) SYRINGE FOR IV PUSH (FOR BLOOD PRESSURE SUPPORT)
PREFILLED_SYRINGE | INTRAVENOUS | Status: AC
  Filled 2020-06-17: qty 10

## 2020-06-17 MED ORDER — MIDAZOLAM HCL 2 MG/2ML IJ SOLN
INTRAMUSCULAR | Status: AC
  Filled 2020-06-17: qty 2

## 2020-06-17 MED ORDER — ONDANSETRON HCL 4 MG/2ML IJ SOLN
INTRAMUSCULAR | Status: DC | PRN
  Administered 2020-06-17: 4 mg via INTRAVENOUS

## 2020-06-17 MED ORDER — ONDANSETRON HCL 4 MG/2ML IJ SOLN: 4.0000 mg | Freq: Four times a day (QID) | INTRAMUSCULAR | Status: DC | PRN

## 2020-06-17 MED ORDER — DIPHENHYDRAMINE HCL 12.5 MG/5ML PO ELIX: 12.5000 mg | ORAL_SOLUTION | ORAL | Status: DC | PRN

## 2020-06-17 MED ORDER — HYDROCODONE-ACETAMINOPHEN 5-325 MG PO TABS
1.0000 | ORAL_TABLET | ORAL | Status: DC | PRN
  Administered 2020-06-17 – 2020-06-23 (×3): 2 via ORAL
  Filled 2020-06-17 (×3): qty 2

## 2020-06-17 MED ORDER — MIRTAZAPINE 15 MG PO TABS
30.0000 mg | ORAL_TABLET | Freq: Every day | ORAL | Status: DC
  Administered 2020-06-17 – 2020-06-22 (×6): 30 mg via ORAL
  Filled 2020-06-17 (×6): qty 2

## 2020-06-17 MED ORDER — PROPOFOL 10 MG/ML IV BOLUS
INTRAVENOUS | Status: AC
  Filled 2020-06-17: qty 20

## 2020-06-17 MED ORDER — CHLORHEXIDINE GLUCONATE 0.12 % MT SOLN
15.0000 mL | Freq: Once | OROMUCOSAL | Status: AC
  Administered 2020-06-17: 15 mL via OROMUCOSAL
  Filled 2020-06-17: qty 15

## 2020-06-17 MED ORDER — FENTANYL CITRATE (PF) 250 MCG/5ML IJ SOLN
INTRAMUSCULAR | Status: AC
  Filled 2020-06-17: qty 5

## 2020-06-17 MED ORDER — GABAPENTIN 300 MG PO CAPS
300.0000 mg | ORAL_CAPSULE | Freq: Three times a day (TID) | ORAL | Status: DC
  Administered 2020-06-18 – 2020-06-19 (×4): 300 mg via ORAL
  Filled 2020-06-17 (×5): qty 1

## 2020-06-17 MED ORDER — AMITRIPTYLINE HCL 50 MG PO TABS
75.0000 mg | ORAL_TABLET | Freq: Every day | ORAL | Status: DC
  Administered 2020-06-17 – 2020-06-22 (×6): 75 mg via ORAL
  Filled 2020-06-17 (×7): qty 1

## 2020-06-17 MED ORDER — CEFAZOLIN SODIUM-DEXTROSE 2-4 GM/100ML-% IV SOLN
2.0000 g | INTRAVENOUS | Status: AC
  Administered 2020-06-17: 2 g via INTRAVENOUS
  Filled 2020-06-17: qty 100

## 2020-06-17 MED ORDER — LACTATED RINGERS IV SOLN: INTRAVENOUS | Status: DC

## 2020-06-17 MED ORDER — DEXAMETHASONE SODIUM PHOSPHATE 10 MG/ML IJ SOLN
INTRAMUSCULAR | Status: DC | PRN
  Administered 2020-06-17: 5 mg via INTRAVENOUS

## 2020-06-17 MED ORDER — HYDROCODONE-ACETAMINOPHEN 7.5-325 MG PO TABS
1.0000 | ORAL_TABLET | ORAL | Status: DC | PRN
  Administered 2020-06-17 (×2): 2 via ORAL
  Administered 2020-06-18 – 2020-06-19 (×6): 1 via ORAL
  Administered 2020-06-20 – 2020-06-23 (×13): 2 via ORAL
  Filled 2020-06-17 (×3): qty 2
  Filled 2020-06-17: qty 1
  Filled 2020-06-17 (×2): qty 2
  Filled 2020-06-17: qty 1
  Filled 2020-06-17 (×2): qty 2
  Filled 2020-06-17: qty 1
  Filled 2020-06-17 (×7): qty 2
  Filled 2020-06-17: qty 1
  Filled 2020-06-17: qty 2
  Filled 2020-06-17 (×2): qty 1

## 2020-06-17 MED ORDER — ACETAMINOPHEN 500 MG PO TABS
500.0000 mg | ORAL_TABLET | Freq: Four times a day (QID) | ORAL | Status: AC
  Administered 2020-06-17 – 2020-06-18 (×2): 500 mg via ORAL
  Filled 2020-06-17 (×2): qty 1

## 2020-06-17 MED ORDER — ENOXAPARIN SODIUM 40 MG/0.4ML ~~LOC~~ SOLN
40.0000 mg | SUBCUTANEOUS | Status: DC
  Administered 2020-06-19 – 2020-06-23 (×5): 40 mg via SUBCUTANEOUS
  Filled 2020-06-17 (×6): qty 0.4

## 2020-06-17 MED ORDER — CEFAZOLIN SODIUM-DEXTROSE 1-4 GM/50ML-% IV SOLN
1.0000 g | Freq: Three times a day (TID) | INTRAVENOUS | Status: AC
  Administered 2020-06-17 – 2020-06-18 (×3): 1 g via INTRAVENOUS
  Filled 2020-06-17 (×3): qty 50

## 2020-06-17 MED ORDER — PHENYLEPHRINE 40 MCG/ML (10ML) SYRINGE FOR IV PUSH (FOR BLOOD PRESSURE SUPPORT)
PREFILLED_SYRINGE | INTRAVENOUS | Status: DC | PRN
  Administered 2020-06-17 (×3): 80 ug via INTRAVENOUS

## 2020-06-17 MED ORDER — ORAL CARE MOUTH RINSE: 15.0000 mL | Freq: Once | OROMUCOSAL | Status: AC

## 2020-06-17 MED ORDER — PROMETHAZINE HCL 25 MG/ML IJ SOLN: 6.2500 mg | INTRAMUSCULAR | Status: DC | PRN

## 2020-06-17 MED ORDER — DEXAMETHASONE SODIUM PHOSPHATE 10 MG/ML IJ SOLN
INTRAMUSCULAR | Status: AC
  Filled 2020-06-17: qty 1

## 2020-06-17 MED ORDER — NAPROXEN 250 MG PO TABS
250.0000 mg | ORAL_TABLET | Freq: Two times a day (BID) | ORAL | Status: DC
  Administered 2020-06-17 – 2020-06-23 (×12): 250 mg via ORAL
  Filled 2020-06-17 (×12): qty 1

## 2020-06-17 MED ORDER — PHENYLEPHRINE HCL-NACL 10-0.9 MG/250ML-% IV SOLN
INTRAVENOUS | Status: DC | PRN
  Administered 2020-06-17: 25 ug/min via INTRAVENOUS

## 2020-06-17 MED ORDER — MORPHINE SULFATE (PF) 2 MG/ML IV SOLN
0.5000 mg | INTRAVENOUS | Status: DC | PRN
  Administered 2020-06-23 (×2): 1 mg via INTRAVENOUS
  Filled 2020-06-17 (×2): qty 1

## 2020-06-17 MED ORDER — LIDOCAINE 2% (20 MG/ML) 5 ML SYRINGE
INTRAMUSCULAR | Status: DC | PRN
  Administered 2020-06-17: 40 mg via INTRAVENOUS

## 2020-06-17 MED ORDER — PROPOFOL 10 MG/ML IV BOLUS
INTRAVENOUS | Status: DC | PRN
  Administered 2020-06-17: 100 mg via INTRAVENOUS
  Administered 2020-06-17: 30 mg via INTRAVENOUS

## 2020-06-17 MED ORDER — CLONIDINE HCL (ANALGESIA) 100 MCG/ML EP SOLN
EPIDURAL | Status: DC | PRN
  Administered 2020-06-17: 30 ug
  Administered 2020-06-17: 50 ug

## 2020-06-17 MED ORDER — BUPIVACAINE HCL (PF) 0.5 % IJ SOLN
INTRAMUSCULAR | Status: DC | PRN
  Administered 2020-06-17: 30 mL
  Administered 2020-06-17: 20 mL

## 2020-06-17 MED ORDER — ONDANSETRON HCL 4 MG PO TABS: 4.0000 mg | ORAL_TABLET | Freq: Four times a day (QID) | ORAL | Status: DC | PRN

## 2020-06-17 MED ORDER — METHOCARBAMOL 1000 MG/10ML IJ SOLN
500.0000 mg | Freq: Four times a day (QID) | INTRAVENOUS | Status: DC | PRN
  Filled 2020-06-17: qty 5

## 2020-06-17 MED ORDER — FENTANYL CITRATE (PF) 100 MCG/2ML IJ SOLN: 25.0000 ug | INTRAMUSCULAR | Status: DC | PRN

## 2020-06-17 MED ORDER — METHOCARBAMOL 500 MG PO TABS
500.0000 mg | ORAL_TABLET | Freq: Four times a day (QID) | ORAL | Status: DC | PRN
  Administered 2020-06-17 – 2020-06-22 (×6): 500 mg via ORAL
  Filled 2020-06-17 (×6): qty 1

## 2020-06-17 MED ORDER — DOCUSATE SODIUM 100 MG PO CAPS
100.0000 mg | ORAL_CAPSULE | Freq: Two times a day (BID) | ORAL | Status: DC
  Administered 2020-06-18 – 2020-06-22 (×5): 100 mg via ORAL
  Filled 2020-06-17 (×10): qty 1

## 2020-06-17 MED ORDER — 0.9 % SODIUM CHLORIDE (POUR BTL) OPTIME
TOPICAL | Status: DC | PRN
  Administered 2020-06-17: 1000 mL

## 2020-06-17 SURGICAL SUPPLY — 67 items
BIT DRILL 110X2.5XQCK CNCT (BIT) IMPLANT
BIT DRILL 2.5 (BIT) ×2
BIT DRILL 4.3 CALIBRATED (DRILL) IMPLANT
BIT DRILL CROWE POINT TWST 4.3 (DRILL) IMPLANT
BIT DRL 110X2.5XQCK CNCT (BIT) ×1
BLADE SURG 15 STRL LF DISP TIS (BLADE) ×1 IMPLANT
BLADE SURG 15 STRL SS (BLADE) ×2
BNDG CMPR MED 10X6 ELC LF (GAUZE/BANDAGES/DRESSINGS) ×1
BNDG COHESIVE 4X5 TAN STRL (GAUZE/BANDAGES/DRESSINGS) ×2 IMPLANT
BNDG ELASTIC 4X5.8 VLCR STR LF (GAUZE/BANDAGES/DRESSINGS) ×1 IMPLANT
BNDG ELASTIC 6X10 VLCR STRL LF (GAUZE/BANDAGES/DRESSINGS) ×2 IMPLANT
BNDG ELASTIC 6X5.8 VLCR STR LF (GAUZE/BANDAGES/DRESSINGS) ×1 IMPLANT
COVER SURGICAL LIGHT HANDLE (MISCELLANEOUS) ×4 IMPLANT
COVER WAND RF STERILE (DRAPES) IMPLANT
DRAPE C-ARM 42X72 X-RAY (DRAPES) ×2 IMPLANT
DRAPE C-ARMOR (DRAPES) ×2 IMPLANT
DRAPE IMP U-DRAPE 54X76 (DRAPES) ×2 IMPLANT
DRAPE INCISE IOBAN 66X45 STRL (DRAPES) IMPLANT
DRAPE ORTHO SPLIT 77X108 STRL (DRAPES)
DRAPE SURG ORHT 6 SPLT 77X108 (DRAPES) IMPLANT
DRAPE U-SHAPE 47X51 STRL (DRAPES) ×2 IMPLANT
DRAPE UNIVERSAL PACK (DRAPES) ×2 IMPLANT
DRILL 4.3 CALIBRATED (DRILL) ×2
DRILL CROWE POINT TWIST 4.3 (DRILL) ×4
DURAPREP 26ML APPLICATOR (WOUND CARE) ×2 IMPLANT
ELECT REM PT RETURN 9FT ADLT (ELECTROSURGICAL) ×2
ELECTRODE REM PT RTRN 9FT ADLT (ELECTROSURGICAL) ×1 IMPLANT
FACESHIELD OPICON LG (MASK) IMPLANT
GAUZE SPONGE 4X4 12PLY STRL (GAUZE/BANDAGES/DRESSINGS) ×2 IMPLANT
GAUZE XEROFORM 1X8 LF (GAUZE/BANDAGES/DRESSINGS) ×4 IMPLANT
GAUZE XEROFORM 5X9 LF (GAUZE/BANDAGES/DRESSINGS) ×1 IMPLANT
GLOVE BIOGEL M STRL SZ7.5 (GLOVE) ×2 IMPLANT
GLOVE BIOGEL PI IND STRL 8 (GLOVE) ×1 IMPLANT
GLOVE BIOGEL PI INDICATOR 8 (GLOVE) ×1
GOWN STRL REUS W/ TWL LRG LVL3 (GOWN DISPOSABLE) ×1 IMPLANT
GOWN STRL REUS W/ TWL XL LVL3 (GOWN DISPOSABLE) ×1 IMPLANT
GOWN STRL REUS W/TWL LRG LVL3 (GOWN DISPOSABLE) ×2
GOWN STRL REUS W/TWL XL LVL3 (GOWN DISPOSABLE) ×2
GUIDEPIN 3.0 THREADED 305MM (PIN) ×1 IMPLANT
GUIDEWIRE 2.6X80 BEAD TIP (WIRE) IMPLANT
GUIDWIRE 2.6X80 BEAD TIP (WIRE) ×2
KIT BASIN OR (CUSTOM PROCEDURE TRAY) ×2 IMPLANT
KIT TURNOVER KIT B (KITS) ×2 IMPLANT
NAIL Z TIBIA 9.3X36 UNIV (Nail) ×1 IMPLANT
PACK ORTHO EXTREMITY (CUSTOM PROCEDURE TRAY) ×2 IMPLANT
PACK UNIVERSAL I (CUSTOM PROCEDURE TRAY) ×2 IMPLANT
PAD ABD 8X10 STRL (GAUZE/BANDAGES/DRESSINGS) ×4 IMPLANT
PAD ARMBOARD 7.5X6 YLW CONV (MISCELLANEOUS) ×2 IMPLANT
PAD CAST 4YDX4 CTTN HI CHSV (CAST SUPPLIES) ×1 IMPLANT
PADDING CAST COTTON 4X4 STRL (CAST SUPPLIES) ×2
PADDING CAST COTTON 6X4 STRL (CAST SUPPLIES) ×2 IMPLANT
SCREW BONE 5.0X35MM CORTICAL (Screw) ×1 IMPLANT
SCREW CORT FEM TI FT 5X27.5 (Screw) ×1 IMPLANT
SCREW CORTICAL HEXAGON 5.0X40 (Screw) ×1 IMPLANT
SCREW HEX HEAD 3.5X32.5 (Screw) ×4 IMPLANT
SCREW LOCKFT SFS 3.5X70 (Screw) ×1 IMPLANT
SCREW Z NAIL 5.0X32.5 CORT (Screw) IMPLANT
SPONGE LAP 18X18 X RAY DECT (DISPOSABLE) ×2 IMPLANT
STAPLER VISISTAT 35W (STAPLE) IMPLANT
STOCKINETTE IMPERVIOUS 9X36 MD (GAUZE/BANDAGES/DRESSINGS) ×2 IMPLANT
SUT ETHILON 2 0 FS 18 (SUTURE) IMPLANT
SUT MON AB 2-0 CT1 27 (SUTURE) ×2 IMPLANT
SUT MON AB 2-0 CT1 36 (SUTURE) ×1 IMPLANT
TOWEL GREEN STERILE (TOWEL DISPOSABLE) ×2 IMPLANT
TOWEL GREEN STERILE FF (TOWEL DISPOSABLE) ×2 IMPLANT
TUBE CONNECTING 12X1/4 (SUCTIONS) ×2 IMPLANT
YANKAUER SUCT BULB TIP NO VENT (SUCTIONS) ×2 IMPLANT

## 2020-06-17 NOTE — Anesthesia Postprocedure Evaluation (Signed)
Anesthesia Post Note  Patient: Monique Perry  Procedure(s) Performed: OPERATIVE TREATMENT OF RIGHT TIBIA AND FIBULA FRACTURE (Right )     Patient location during evaluation: PACU Anesthesia Type: General Level of consciousness: sedated and patient cooperative Pain management: pain level controlled Vital Signs Assessment: post-procedure vital signs reviewed and stable Respiratory status: spontaneous breathing Cardiovascular status: stable Anesthetic complications: no   No complications documented.  Last Vitals:  Vitals:   06/17/20 1025 06/17/20 1053  BP: (!) 96/50 (!) 99/57  Pulse: 74 86  Resp: 15 15  Temp:  36.6 C  SpO2: 95% 99%    Last Pain:  Vitals:   06/17/20 1606  TempSrc:   PainSc: [place]

## 2020-06-17 NOTE — H&P (Signed)
Monique Perry is an 64 y.o. female.   Chief Complaint: Right distal tibia and fibula fracture HPI: Monique Perry is here today for surgical fixation of her tibia and fibula fractures.  She sustained these proximately 1 week ago after a fall.  She was placed in a splint in the emergency department and sent home.  She was sent to me for evaluation.  On evaluation she had a displaced distal tibia fracture with some varus through the ankle.  This is an extra-articular fracture and in the metadiaphyseal region of the tibia.  She also has a small distal fibular avulsion fracture.  She has pain in the right ankle.  She has been maintaining nonweightbearing.  The splint is comfortable.  She denies numbness or tingling.  Past Medical History:  Diagnosis Date  . Alcohol abuse last February 2012  . Arthritis   . Bipolar disorder ([place])   . Collagenous colitis   . Drug abuse ([place]) last 1990's  . Peripheral neuropathy 11/29/2018  . Pneumonia     Past Surgical History:  Procedure Laterality Date  . ABDOMINAL HYSTERECTOMY  Age 45 years ago   partial   . FOOT SURGERY    . IR GENERIC HISTORICAL  07/16/2016   IR US GUIDE VASC ACCESS RIGHT 07/16/2016 Arne Cleveland, MD [facility]  . IR GENERIC HISTORICAL  07/16/2016   IR ANGIOGRAM VISCERAL SELECTIVE 07/16/2016 Arne Cleveland, MD [facility]  . IR GENERIC HISTORICAL  07/16/2016   IR ANGIOGRAM SELECTIVE EACH ADDITIONAL VESSEL 07/16/2016 Arne Cleveland, MD [facility]  . IR GENERIC HISTORICAL  07/16/2016   IR EMBO ART  VEN HEMORR LYMPH EXTRAV  INC GUIDE ROADMAPPING 07/16/2016 Arne Cleveland, MD [facility]  . IR GENERIC HISTORICAL  07/16/2016   IR ANGIOGRAM FOLLOW UP STUDY 07/16/2016 Arne Cleveland, MD [facility]  . Lapidus fusion Right 04/17/2012  . NOSE SURGERY    . OSTEOTOMY Right 04/17/2012   Rt #2 Metatarsal  . TONSILLECTOMY  As a child   . WRIST SURGERY      Family History  Problem Relation Age of Onset  . Stroke Mother   . Diabetes Father   . Hypertension  Father   . Hyperlipidemia Father   . Breast cancer Sister    Social History:  reports that she has been smoking cigarettes and cigars. She has a 33.75 pack-year smoking history. She has never used smokeless tobacco. She reports previous alcohol use. She reports previous drug use.  Allergies:  Allergies  Allergen Reactions  . Erythrocin Other (See Comments)    Thrush  . Sulfa Antibiotics Other (See Comments)    Thrush   . Trazodone And Nefazodone Other (See Comments)    Severe insomnia  . Tramadol Itching    Medications Prior to Admission  Medication Sig Dispense Refill  . acetaminophen (TYLENOL) 325 MG tablet Take 650 mg by mouth every 6 (six) hours as needed for headache.     Marland Kitchen amitriptyline (ELAVIL) 75 MG tablet Take 75 mg by mouth in the morning and at bedtime.    Marland Kitchen HYDROcodone-acetaminophen (NORCO/VICODIN) 5-325 MG tablet Take 1 tablet by mouth every 6 (six) hours as needed. 6 tablet 0  . ibuprofen (ADVIL) 200 MG tablet Take 800 mg by mouth every 6 (six) hours as needed for moderate pain.     . mirtazapine (REMERON) 30 MG tablet Take 30 mg by mouth at bedtime.       Results for orders placed or performed during the hospital encounter of  06/17/20 (from the past 48 hour(s))  CBC per protocol     Status: Abnormal   Collection Time: 06/17/20  6:27 AM  Result Value Ref Range   WBC 7.9 4.0 - 10.5 K/uL   RBC 3.02 (L) 3.87 - 5.11 MIL/uL   Hemoglobin 10.3 (L) 12.0 - 15.0 g/dL   HCT 31.0 (L) 36 - 46 %   MCV 102.6 (H) 80.0 - 100.0 fL   MCH 34.1 (H) 26.0 - 34.0 pg   MCHC 33.2 30.0 - 36.0 g/dL   RDW 15.9 (H) 11.5 - 15.5 %   Platelets 216 150 - 400 K/uL   nRBC 0.3 (H) 0.0 - 0.2 %    Comment: Performed at [facility], [address], [place], [place] [address]   No results found.  Review of Systems  Constitutional: Negative.   HENT: Negative.   Eyes: Negative.   Respiratory: Negative.   Cardiovascular: Negative.   Gastrointestinal: Negative.   Endocrine: Negative.    Musculoskeletal:       Right leg pain  Skin: Negative.   Neurological: Negative.   Psychiatric/Behavioral: Negative.     Blood pressure (!) 139/57, pulse 96, temperature 98.2 F (36.8 C), temperature source Oral, resp. rate 17, height 5\' 6"  (1.676 m), SpO2 100 %. Physical Exam HENT:     Head: Normocephalic.     Mouth/Throat:     Mouth: Mucous membranes are moist.  Eyes:     Extraocular Movements: Extraocular movements intact.  Cardiovascular:     Rate and Rhythm: Normal rate.     Pulses: Normal pulses.  Pulmonary:     Effort: Pulmonary effort is normal.  Abdominal:     General: Abdomen is flat.  Musculoskeletal:     Cervical back: Neck supple.     Comments: Right lower extremity in a short leg splint.  There is swelling to the dorsal distal foot.  She is able to wiggle toes.  Dorsal sensation light touch about the toes.  No tenderness palpation proximal to the splint.  No evidence of bilateral upper or left lower extremity injury.  Skin:    General: Skin is warm.  Neurological:     General: No focal deficit present.     Mental Status: She is alert.  Psychiatric:        Mood and Affect: Mood normal.      Assessment/Plan We will plan for operative fixation of her right tibia fracture.  Possible fixation of fibula.  This will be using an intramedullary nail.  We will plan for observation postoperatively and possible SNF placement if needed based on physical therapy evaluation.  She understands the risks, benefits and alternatives to surgery which include but are not limited to wound healing complications, infection, nonunion, malunion, need for further surgery, damage to surrounding structures and continued pain.  She understands the postoperative weightbearing restrictions and agrees to comply.  Erle Crocker, MD 06/17/2020, 7:06 AM

## 2020-06-17 NOTE — Op Note (Addendum)
Monique Perry female 64 y.o. 06/17/2020  PreOperative Diagnosis: Right metadiaphyseal distal tibia and fibula fractures  PostOperative Diagnosis: Same  PROCEDURE: Intramedullary nail of right tibia Open reduction internal fixation of right fibula Ankle stress view fluoroscopy  SURGEON: Melony Overly, MD  ASSISTANT: Levada Dy, RNFA  ANESTHESIA: General with peripheral nerve block  FINDINGS: Displaced distal metaphyseal tibia fracture and distal fibula fracture  IMPLANTS: [employer] 9 x 360 nail  INDICATIONS:63 y.o. female patient had a fall and sustained the above injury.  She was seen in my office after being discharged from the hospital after splint placement and diagnosis.  She was indicated for surgery due to displacement and instability of the fracture.  She understood the risks, benefits and alternatives to surgery which include but not limited to wound healing complications, infection, nonunion, malunion, need for further surgery as well as damage to surrounding structures and continued pain.  She opted to proceed with surgery.  PROCEDURE:Patient was identified the preoperative holding area.  The right leg was marked myself.  Consent was signed myself and the patient.  Peripheral nerve block was performed by anesthesia.  Patient was taken to the operative suite and placed supine on the operative table.  General anesthesia was induced out difficulty.  Preoperative antibiotics were given.  Thigh tourniquet was placed on the operative thigh and a bump was placed under the hip. Bone foam was used.  All bony prominences well-padded.  Surgical timeout was performed.    We began by taking fluoroscopic images to reduce the fracture site.  We able to reduce the fracture site under fluoroscopy using a combination of bumping and external rotation through the distal leg .  Once the fracture was appropriately reduced of the tibia then a small incision was made proximally about the  lateral aspect of the patella tendon.  This taken sharply down through skin and subcutaneous tissue.  The peritenon tissue was incised and incision made along the length of the patellar tendon.  Care was taken not to violate the joint capsule.  Then once the tendon was mobilized we are able to gain entry to the proximal tibia.  The awl was placed in appropriate positioning from arthroscopy to be appropriate position of the proximal tibia.  Then this was inserted into the bone.  The guide wire was placed here down across the fracture site.  Care was taken to find center center position of the distal tibia.  Then the tibia was sequentially reamed after opening reamer was used to gain entry into the tibia.  We reamed to a 11 mm reamer.  Then holding the reduction appropriately the nail was placed in the tibia across the fracture site.  There is good maintenance of reduction.  2 proximal interlocking screws and 3 distal interlocking screws were placed.    We then turned our attention to the fibula.  There is continued instability of the ankle with stress.  Due to the continued instability through the distal fibula fracture we opted for internal fixation.  An incision was made at the tip of the distal fibula.  This taken sharply down through skin subcutaneous tissue.  Blunt dissection was used to gain access to the tip of the fibula.  Using fluoroscopy a 2.5 mm drill was used to violate the cortex of the distal fibula.  Then a single 70 mm 3.5 mm cortical screw was placed across the fracture site.  This was done with the ankle held in a valgus position to allow for  appropriate reduction of the fracture.  After placement of the screw the ankle was stable to stress.  Then final fluoroscopic images were obtained and the fracture was acceptably reduced.  The screw lengths were acceptable.  The nail was in the appropriate place.  The wounds were all irrigated copiously with normal saline.  2-0 Monocryl stitch was used  to close the deep tissue and staples for the skin.  She was placed in a soft dressing.  CAM Walker boot was placed.  She was awaked from anesthesia and taken recovery in stable condition.  There were no complications.   POST OPERATIVE INSTRUCTIONS: Patient will be admitted for observation Lovenox for DVT prophylaxis given long bone fracture. Work with physical therapy for disposition Touchdown weightbearing to right lower extremity.   Maintain CAM Walker boot at all times.  TOURNIQUET TIME: No tourniquet was used  BLOOD LOSS:  less than 100 mL         DRAINS: none         SPECIMEN: none       COMPLICATIONS:  * No complications entered in OR log *         Disposition: PACU - hemodynamically stable.         Condition: stable

## 2020-06-17 NOTE — Progress Notes (Signed)
Orthopedic Tech Progress Note Patient Details:  Monique Perry October 08, 1956 [id number] OR RN called for a CAM WALKER BOOT. Dropped off at desk Ortho Devices Type of Ortho Device: CAM walker Ortho Device/Splint Interventions: Other (comment)   Post Interventions Patient Tolerated: Other (comment) Instructions Provided: Other (comment)   Janit Pagan 06/17/2020, 9:35 AM

## 2020-06-17 NOTE — Anesthesia Procedure Notes (Signed)
Anesthesia Regional Block: Popliteal block   Pre-Anesthetic Checklist: ,, timeout performed, Correct Patient, Correct Site, Correct Laterality, Correct Procedure, Correct Position, site marked, Risks and benefits discussed,  Surgical consent,  Pre-op evaluation,  At surgeon's request and post-op pain management  Laterality: Right  Prep: chloraprep       Needles:  Injection technique: Single-shot  Needle Type: Stimiplex     Needle Length: 10cm  Needle Gauge: 21     Additional Needles:   Procedures:,,,, ultrasound used (permanent image in chart),,,,  Motor weakness within 5 minutes.  Narrative:  Start time: 06/17/2020 7:13 AM End time: 06/17/2020 7:18 AM Injection made incrementally with aspirations every 5 mL.  Performed by: Personally  Anesthesiologist: Nolon Nations, MD  Additional Notes: Nerve located and needle positioned with direct ultrasound guidance. Good perineural spread. Patient tolerated well.

## 2020-06-17 NOTE — Transfer of Care (Signed)
Immediate Anesthesia Transfer of Care Note  Patient: Monique Perry  Procedure(s) Performed: OPERATIVE TREATMENT OF RIGHT TIBIA AND FIBULA FRACTURE (Right ) EXTERNAL FIXATION LEG (Right )  Patient Location: PACU  Anesthesia Type:GA combined with regional for post-op pain  Level of Consciousness: awake and patient cooperative  Airway & Oxygen Therapy: Patient Spontanous Breathing and Patient connected to nasal cannula oxygen  Post-op Assessment: Report given to RN and Post -op Vital signs reviewed and stable  Post vital signs: Reviewed and stable  Last Vitals:  Vitals Value Taken Time  BP 120/61 06/17/20 0928  Temp    Pulse 80 06/17/20 0929  Resp 13 06/17/20 0929  SpO2 100 % 06/17/20 0929  Vitals shown include unvalidated device data.  Last Pain:  Vitals:   06/17/20 0650  TempSrc:   PainSc: 8       Patients Stated Pain Goal: 3 (02/58/52 7782)  Complications: No complications documented.

## 2020-06-17 NOTE — Evaluation (Addendum)
Physical Therapy Evaluation Patient Details Name: Monique Perry MRN: [id number] DOB: 05/22/1956 Today's Date: 06/17/2020   History of Present Illness  Patient is a 64 y/o female admitted with R tib/fib fracture from a fall.  Now s/p IM nail tibia and ORIF fibula.  Patient has PMH of neuropathy, bipolar, arthritis, colitis and h/o ETOH Abuse.  Clinical Impression  Patient presents with decreased mobility due to pain, decreased balance, decreased activity tolerance and high risk for falls with TDWB status on R LE.  Was at home sleeping on the floor and scooting on her bottom to keep weight off the leg.  Agree she will need STSNF level rehab at d/c. PT to follow acutely.     Follow Up Recommendations SNF    Equipment Recommendations  None recommended by PT    Recommendations for Other Services       Precautions / Restrictions Precautions Precautions: Fall Required Braces or Orthoses: Other Brace Other Brace: camboot R LE Restrictions Weight Bearing Restrictions: Yes RLE Weight Bearing: Touchdown weight bearing      Mobility  Bed Mobility Overal bed mobility: Needs Assistance Bed Mobility: Sidelying to Sit              Transfers Overall transfer level: Needs assistance Equipment used: Rolling walker (2 wheeled) (& tried knee walker) Transfers: Sit to/from [employer] Transfers Sit to Stand: Min assist   Squat pivot transfers: Min assist     General transfer comment: assist for balance using handles on BSC to pivot scooting with L foot ; STS to RW for pivotal steps scooting L foot and cues for limited weight R foot; stood to knee scooter but leg too painful to put on scooter so demonstrated for pt, pivot to recliner end of session  Ambulation/Gait             General Gait Details: unable  Stairs            Wheelchair Mobility    Modified Rankin (Stroke Patients Only)       Balance Overall balance assessment: Needs assistance   Sitting  balance-Leahy Scale: Fair     Standing balance support: Bilateral upper extremity supported Standing balance-Leahy Scale: Poor                               Pertinent Vitals/Pain Pain Assessment: 0-10 Pain Score: 10-Worst pain ever Pain Location: in the ankle Pain Descriptors / Indicators: Aching Pain Intervention(s): Monitored during session    Home Living Family/patient expects to be discharged to:: Skilled nursing facility Living Arrangements: Alone Available Help at Discharge: Available PRN/intermittently Type of Home: Apartment Home Access: Level entry     Home Layout: One level Home Equipment: Bedside commode;Grab bars - toilet;Grab bars - tub/shower;Walker - 2 wheels;Wheelchair - manual;Crutches Additional Comments: has a high bed with rails so slept on the floor past week; reports ortho ordered a knee scooter    Prior Function                 Hand Dominance        Extremity/Trunk Assessment   Upper Extremity Assessment Upper Extremity Assessment: Overall WFL for tasks assessed    Lower Extremity Assessment Lower Extremity Assessment: RLE deficits/detail RLE Deficits / Details: camboot on and some difficulty lifting it, uses her hands to get that leg off the bed RLE Sensation: history of peripheral neuropathy       Communication  Communication: No difficulties  Cognition Arousal/Alertness: Awake/alert Behavior During Therapy: WFL for tasks assessed/performed Overall Cognitive Status: No family/caregiver present to determine baseline cognitive functioning                                 General Comments: reports difficulty getting around so was sleeping in living room floor and scooting around on her bottom      General Comments      Exercises     Assessment/Plan    PT Assessment Patient needs continued PT services  PT Problem List Decreased strength;Decreased mobility;Decreased safety awareness;Decreased range  of motion;Decreased balance;Decreased activity tolerance;Decreased knowledge of use of DME;Impaired sensation       PT Treatment Interventions DME instruction;Therapeutic activities;Therapeutic exercise;Balance training;Functional mobility training;Gait training;Patient/family education;Wheelchair mobility training    PT Goals (Current goals can be found in the Care Plan section)  Acute Rehab PT Goals Patient Stated Goal: to go to rehab prior to home PT Goal Formulation: With patient Time For Goal Achievement: 07/01/20 Potential to Achieve Goals: Good    Frequency Min 3X/week   Barriers to discharge Decreased caregiver support      Co-evaluation               AM-PAC PT "6 Clicks" Mobility  Outcome Measure Help needed turning from your back to your side while in a flat bed without using bedrails?: A Little Help needed moving from lying on your back to sitting on the side of a flat bed without using bedrails?: A Little Help needed moving to and from a bed to a chair (including a wheelchair)?: A Little Help needed standing up from a chair using your arms (e.g., wheelchair or bedside chair)?: A Little Help needed to walk in hospital room?: Total Help needed climbing 3-5 steps with a railing? : Total 6 Click Score: 14    End of Session Equipment Utilized During Treatment: Gait belt Activity Tolerance: Patient limited by pain Patient left: in chair;with call bell/phone within reach;with chair alarm set Nurse Communication: Mobility status PT Visit Diagnosis: Other abnormalities of gait and mobility (R26.89);History of falling (Z91.81);Difficulty in walking, not elsewhere classified (R26.2);Pain Pain - Right/Left: Right Pain - part of body: Ankle and joints of foot    Time: 0258-5277 PT Time Calculation (min) (ACUTE ONLY): 27 min   Charges:   PT Evaluation $PT Eval Moderate Complexity: 1 Mod PT Treatments $Therapeutic Activity: 8-22 mins        Magda Kiel,  PT Acute Rehabilitation Services OEUMP:[phone] [phone] 06/17/2020   Reginia Naas 06/17/2020, 5:47 PM

## 2020-06-17 NOTE — Anesthesia Procedure Notes (Signed)
Anesthesia Regional Block: Adductor canal block   Pre-Anesthetic Checklist: ,, timeout performed, Correct Patient, Correct Site, Correct Laterality, Correct Procedure, Correct Position, site marked, Risks and benefits discussed,  Surgical consent,  Pre-op evaluation,  At surgeon's request and post-op pain management  Laterality: Right  Prep: chloraprep       Needles:  Injection technique: Single-shot  Needle Type: Stimiplex     Needle Length: 9cm  Needle Gauge: 21     Additional Needles:   Procedures:,,,, ultrasound used (permanent image in chart),,,,  Narrative:  Start time: 06/17/2020 7:07 AM End time: 06/17/2020 7:13 AM Injection made incrementally with aspirations every 5 mL.  Performed by: Personally  Anesthesiologist: Nolon Nations, MD  Additional Notes: BP cuff, EKG monitors applied. Sedation begun. Artery and nerve location verified with U/S and anesthetic injected incrementally, slowly, and after negative aspirations under direct u/s guidance. Good fascial /perineural spread. Tolerated well.

## 2020-06-17 NOTE — Anesthesia Procedure Notes (Signed)
Procedure Name: LMA Insertion Date/Time: 06/17/2020 7:39 AM Performed by: Moshe Salisbury, CRNA Pre-anesthesia Checklist: Patient identified, Emergency Drugs available, Suction available and Patient being monitored Patient Re-evaluated:Patient Re-evaluated prior to induction Oxygen Delivery Method: Circle System Utilized Preoxygenation: Pre-oxygenation with 100% oxygen Induction Type: IV induction Ventilation: Mask ventilation without difficulty LMA: LMA inserted LMA Size: 4.0 Number of attempts: 1 Placement Confirmation: positive ETCO2 Tube secured with: Tape Dental Injury: Teeth and Oropharynx as per pre-operative assessment

## 2020-06-18 DIAGNOSIS — Z79899 Other long term (current) drug therapy: Secondary | ICD-10-CM | POA: Diagnosis not present

## 2020-06-18 DIAGNOSIS — S82251A Displaced comminuted fracture of shaft of right tibia, initial encounter for closed fracture: Secondary | ICD-10-CM | POA: Diagnosis not present

## 2020-06-18 DIAGNOSIS — G9009 Other idiopathic peripheral autonomic neuropathy: Secondary | ICD-10-CM | POA: Diagnosis not present

## 2020-06-18 DIAGNOSIS — F319 Bipolar disorder, unspecified: Secondary | ICD-10-CM | POA: Diagnosis not present

## 2020-06-18 DIAGNOSIS — S8261XA Displaced fracture of lateral malleolus of right fibula, initial encounter for closed fracture: Secondary | ICD-10-CM | POA: Diagnosis not present

## 2020-06-18 LAB — HIV ANTIBODY (ROUTINE TESTING W REFLEX): HIV Screen 4th Generation wRfx: NONREACTIVE

## 2020-06-18 NOTE — Progress Notes (Signed)
RE: Monique Perry Date of Birth:  02-Mar-1956 Date: 06/18/2020  Please be advised that the above-named patient will require a short-term nursing home stay - anticipated 30 days or less for rehabilitation and strengthening.  The plan is for return home.

## 2020-06-18 NOTE — NC FL2 (Signed)
[place] LEVEL OF CARE SCREENING TOOL     IDENTIFICATION  Patient Name: Monique Perry Birthdate: 04/09/1956 Sex: female Admission Date (Current Location): 06/17/2020  [facility] and [place] Number:  Herbalist and Address:  [facility] [place], [place] [address] [place],  [address]      Provider Number: [id number]  Attending Physician Name and Address:  Erle Crocker, MD  Relative Name and Phone Number:       Current Level of Care: Hospital Recommended Level of Care: [place] Prior Approval Number:    Date Approved/Denied:   PASRR Number: pending  Discharge Plan: SNF    Current Diagnoses: Patient Active Problem List   Diagnosis Date Noted  . Tibia fracture 06/17/2020  . Peripheral neuropathy 11/29/2018  . Hypercalcemia 01/18/2017  . Splenic rupture 07/15/2016  . Serrated adenoma of cecum s/p polypectomy 2011 07/15/2016  . Neck pain on left side 11/02/2014  . Elevated transaminase level 02/14/2014  . Anemia 11/14/2013  . History of alcoholism ([place]) 11/12/2013  . Hepatitis B vaccination administered at current visit 11/12/2013  . Knee pain 06/18/2013  . Right foot pain s/p fusion surgery of right foot 05/10/2013  . Fracture of metatarsal of right foot, closed 03/20/2013  . Abnormality of gait 03/20/2013  . Low back pain radiating to right leg 02/20/2013  . Chronic back pain 02/08/2013  . Atherosclerotic peripheral vascular disease with intermittent claudication ([place]) 02/08/2013  . Carotid bruit 02/08/2013  . High blood pressure 02/08/2013  . LSIL (low grade squamous intraepithelial lesion) on Pap smear 12/14/2012  . Osteopenia 10/02/2012  . Hyperlipidemia 08/31/2012  . Depression 08/31/2012  . left nose skin lesion 08/31/2012  . Drug abuse, history of   . Bipolar disorder ([place])   . Collagenous colitis   . Medically noncompliant 07/26/2012  . GERD (gastroesophageal reflux disease) 07/21/2012   . Tobacco abuse 07/21/2012    Orientation RESPIRATION BLADDER Height & Weight     Self, Time, Situation, Place  Normal Continent Weight:   Height:  5' 5.98" (167.6 cm)  BEHAVIORAL SYMPTOMS/MOOD NEUROLOGICAL BOWEL NUTRITION STATUS      Continent Diet (Refer to d/c summary)  AMBULATORY STATUS COMMUNICATION OF NEEDS Skin   Extensive Assist Verbally Surgical wounds (s/p IM nail tibia and ORIF fibula, 7/6)                       Personal Care Assistance Level of Assistance  Bathing, Dressing, Feeding Bathing Assistance: Maximum assistance Feeding assistance: Independent Dressing Assistance: Maximum assistance     Functional Limitations Info  Sight, Hearing, Speech Sight Info: Adequate Hearing Info: Adequate Speech Info: Adequate    SPECIAL CARE FACTORS FREQUENCY  PT (By licensed PT), OT (By licensed OT)     PT Frequency: 5x/week, evaluate and treat OT Frequency: 5x/week, evaluate and treat            Contractures Contractures Info: Not present    Additional Factors Info  Code Status, Allergies, Psychotropic Code Status Info: Full Code Allergies Info: Erythrocin, Sulfa Antibiotics, Trazodone And Nefazodone, Tramadol           Current Medications (06/18/2020):  This is the current hospital active medication list Current Facility-Administered Medications  Medication Dose Route Frequency Provider Last Rate Last Admin  . acetaminophen (TYLENOL) tablet 325-650 mg  325-650 mg Oral Q6H PRN Erle Crocker, MD      . amitriptyline (ELAVIL) tablet 75 mg  75 mg Oral QHS Erle Crocker, MD   75 mg at 06/17/20 2236  . diphenhydrAMINE (BENADRYL) 12.5 MG/5ML elixir 12.5-25 mg  12.5-25 mg Oral Q4H PRN Erle Crocker, MD      . docusate sodium (COLACE) capsule 100 mg  100 mg Oral BID Erle Crocker, MD      . enoxaparin (LOVENOX) injection 40 mg  40 mg Subcutaneous Q24H Erle Crocker, MD      . gabapentin (NEURONTIN) capsule 300 mg  300 mg Oral TID  Erle Crocker, MD   300 mg at 06/18/20 1023  . HYDROcodone-acetaminophen (NORCO) 7.5-325 MG per tablet 1-2 tablet  1-2 tablet Oral Q4H PRN Erle Crocker, MD   2 tablet at 06/17/20 2030  . HYDROcodone-acetaminophen (NORCO/VICODIN) 5-325 MG per tablet 1-2 tablet  1-2 tablet Oral Q4H PRN Erle Crocker, MD   2 tablet at 06/17/20 1109  . methocarbamol (ROBAXIN) tablet 500 mg  500 mg Oral Q6H PRN Erle Crocker, MD   500 mg at 06/17/20 2240   Or  . methocarbamol (ROBAXIN) 500 mg in dextrose 5 % 50 mL IVPB  500 mg Intravenous Q6H PRN Erle Crocker, MD      . mirtazapine (REMERON) tablet 30 mg  30 mg Oral QHS Erle Crocker, MD   30 mg at 06/17/20 2236  . morphine 2 MG/ML injection 0.5-1 mg  0.5-1 mg Intravenous Q2H PRN Erle Crocker, MD      . naproxen (NAPROSYN) tablet 250 mg  250 mg Oral BID WC Erle Crocker, MD   250 mg at 06/18/20 0824  . ondansetron (ZOFRAN) tablet 4 mg  4 mg Oral Q6H PRN Erle Crocker, MD       Or  . ondansetron [facility]) injection 4 mg  4 mg Intravenous Q6H PRN Erle Crocker, MD         Discharge Medications: Please see discharge summary for a list of discharge medications.  Relevant Imaging Results:  Relevant Lab Results:   Additional Information [id number]  Sharin Mons, RN

## 2020-06-18 NOTE — TOC Initial Note (Addendum)
Transition of Care [facility]) - Initial/Assessment Note    Patient Details  Name: Monique Perry MRN: [id number] Date of Birth: [birth year]  Transition [facility] CM/SW Contact:    Sharin Mons, RN Phone Number: 06/18/2020, 9:43 AM  Clinical Narrative:      Admitted with R tib/fib fracture from a fall.         - s/p IM nail tibia and ORIF fibula, 7/6           NCM received consult for possible SNF placement at time of discharge. NCM spoke with patient regarding PT recommendation of SNF placement at time of discharge. Patient reported that she lives alone and is currently unable to care for self at home given her current physical needs and fall risk. Patient expressed understanding of PT recommendation and is agreeable to SNF placement at time of discharge. Patient reports [facility] SNF. 2nd choice [place]. NCM discussed insurance authorization process and provided Medicare SNF ratings list. Patient expressed being hopeful for rehab and to feel better soon. No further questions reported at this time. NCM to continue to follow and assist with discharge planning needs.  06/18/2020 Marcine Matar , bed offers pending ...   Expected Discharge Plan: Skilled Nursing Facility Barriers to Discharge: Continued Medical Work up, Ship broker, No SNF bed   Patient Goals and CMS Choice Patient states their goals for this hospitalization and ongoing recovery are:: to get better and get home to my cat CMS Medicare.gov Compare Post Acute Care list provided to:: Patient Choice offered to / list presented to : Patient  Expected Discharge Plan and Services Expected Discharge Plan: [place]   Discharge Planning Services: CM Consult                                 [facility] Agency: [facility] Date [place]: 06/17/20 Time [place]: 1414 Representative spoke with at [place]: Benjamin  Arrangements/Services                       Activities of [place] Devices/Equipment: Raised toilet seat with rails, Environmental consultant (specify type), Cane (specify quad or straight) ADL Screening (condition at time of admission) Patient's cognitive ability adequate to safely complete daily activities?: Yes Is the patient deaf or have difficulty hearing?: No Does the patient have difficulty seeing, even when wearing glasses/contacts?: No Does the patient have difficulty concentrating, remembering, or making decisions?: No Patient able to express need for assistance with ADLs?: No Does the patient have difficulty dressing or bathing?: Yes (related to cast) Independently performs ADLs?: Yes (appropriate for developmental age) Does the patient have difficulty walking or climbing stairs?: Yes (related to right leg injury) Weakness of Legs: None Weakness of Arms/Hands: Both  Permission Sought/Granted                  Emotional Assessment              Admission diagnosis:  Tibia fracture [S82.209A] Patient Active Problem List   Diagnosis Date Noted  . Tibia fracture 06/17/2020  . Peripheral neuropathy 11/29/2018  . Hypercalcemia 01/18/2017  . Splenic rupture 07/15/2016  . Serrated adenoma of cecum s/p polypectomy 2011 07/15/2016  . Neck pain on left side 11/02/2014  . Elevated transaminase level 02/14/2014  . Anemia 11/14/2013  . History of  alcoholism ([place]) 11/12/2013  . Hepatitis B vaccination administered at current visit 11/12/2013  . Knee pain 06/18/2013  . Right foot pain s/p fusion surgery of right foot 05/10/2013  . Fracture of metatarsal of right foot, closed 03/20/2013  . Abnormality of gait 03/20/2013  . Low back pain radiating to right leg 02/20/2013  . Chronic back pain 02/08/2013  . Atherosclerotic peripheral vascular disease with intermittent claudication ([place]) 02/08/2013  . Carotid bruit 02/08/2013  . High blood pressure 02/08/2013  .  LSIL (low grade squamous intraepithelial lesion) on Pap smear 12/14/2012  . Osteopenia 10/02/2012  . Hyperlipidemia 08/31/2012  . Depression 08/31/2012  . left nose skin lesion 08/31/2012  . Drug abuse, history of   . Bipolar disorder ([place])   . Collagenous colitis   . Medically noncompliant 07/26/2012  . GERD (gastroesophageal reflux disease) 07/21/2012  . Tobacco abuse 07/21/2012   PCP:  Kathyrn Lass, MD Pharmacy:   [id number] [place], [place] [address] Phone: [phone] Fax: [phone]     Social Determinants of Health (SDOH) Interventions    Readmission Risk Interventions No flowsheet data found.

## 2020-06-18 NOTE — Progress Notes (Signed)
     Monique Perry is a 64 y.o. female   Orthopaedic diagnosis:Right distal tibia and fibula fracture status post intramedullary nail and screw fixation  Subjective: Monique Perry is doing well this morning.  Pain is controlled.  She has gotten up out of bed some and been able to maintain weightbearing restrictions.  She worked with physical therapy.  Objectyive: Vitals:   06/18/20 0029 06/18/20 0358  BP: 95/67 91/68  Pulse: 80 86  Resp: 15 16  Temp: (!) 97.5 F (36.4 C) 97.7 F (36.5 C)  SpO2: 90% 93%     Exam: Awake and alert Respirations even and unlabored No acute distress  Some shadowing about the dressing.  Dressing in place.  Assessment: Postoperative day 1   Plan: She will continue to work with physical therapy.  They have recommended SNF placement.  Will await approval and bed availability.   Touchdown weightbearing to right lower extremity.   Reinforce dressing as needed for drainage.   Walking boot on at all times. Lovenox for DVT prophylaxis.   Radene Journey, MD

## 2020-06-18 NOTE — Plan of Care (Signed)

## 2020-06-18 NOTE — Plan of Care (Signed)

## 2020-06-18 NOTE — Progress Notes (Signed)
Physical Therapy Treatment Patient Details Name: Monique Perry MRN: [id number] DOB: [birth year] Today's Date: 06/18/2020    History of Present Illness Patient is a 64 y/o female admitted with R tib/fib fracture from a fall.  Now s/p IM nail tibia and ORIF fibula.  Patient has PMH of neuropathy, bipolar, arthritis, colitis and h/o ETOH Abuse.    PT Comments    Patient is progressing very well towards their physical therapy goals, remains very motivated to participate. Ambulating 30 feet with a walker at a min guard assist level. Rest of session focused on seated therapeutic exercises. Pt has difficulty maintaining weightbearing precautions due to weakness and history of peripheral neuropathy with diminished sensation distal to both knees. Remains at high risk for falls based on history of falling, sensation impairment, balance deficits and decreased gait speed. In light of deficits and decreased caregiver support, continue to recommend SNF at discharge.     Follow Up Recommendations  SNF     Equipment Recommendations  None recommended by PT    Recommendations for Other Services       Precautions / Restrictions Precautions Precautions: Fall Required Braces or Orthoses: Other Brace Other Brace: camboot R LE Restrictions Weight Bearing Restrictions: Yes RLE Weight Bearing: Touchdown weight bearing    Mobility  Bed Mobility Overal bed mobility: Modified Independent             General bed mobility comments: No physical assist required to transition to edge of bed  Transfers Overall transfer level: Needs assistance Equipment used: Rolling walker (2 wheeled) Transfers: Sit to/from Stand Sit to Stand: Min assist         General transfer comment: MinA to rise to stand from edge of bed and toilet, cues for placing RLE anteriorly  Ambulation/Gait Ambulation/Gait assistance: Min guard Gait Distance (Feet): 30 Feet Assistive device: Rolling walker (2 wheeled) Gait  Pattern/deviations: Step-to pattern;Step-through pattern;Decreased weight shift to right Gait velocity: decreased   General Gait Details: Cues for sequencing, weightbearing precautions, walker proximity   Stairs             Wheelchair Mobility    Modified Rankin (Stroke Patients Only)       Balance Overall balance assessment: Needs assistance Sitting-balance support: Feet supported Sitting balance-Leahy Scale: Good     Standing balance support: Bilateral upper extremity supported Standing balance-Leahy Scale: Poor                              Cognition Arousal/Alertness: Awake/alert Behavior During Therapy: WFL for tasks assessed/performed Overall Cognitive Status: No family/caregiver present to determine baseline cognitive functioning                                 General Comments: Intermittently needs redirection to tasks      Exercises General Exercises - Lower Extremity Long Arc Quad: Both;10 reps;Seated Hip ABduction/ADduction: Both;10 reps;Seated Hip Flexion/Marching: Left;10 reps;Seated    General Comments        Pertinent Vitals/Pain Pain Assessment: Faces Faces Pain Scale: Hurts little more Pain Location: RLE Pain Descriptors / Indicators: Aching Pain Intervention(s): Limited activity within patient's tolerance;Monitored during session    Home Living                      Prior Function            PT Goals (  current goals can now be found in the care plan section) Acute Rehab PT Goals Patient Stated Goal: go to rehab Potential to Achieve Goals: Good Progress towards PT goals: Progressing toward goals    Frequency    Min 3X/week      PT Plan Current plan remains appropriate    Co-evaluation              AM-PAC PT "6 Clicks" Mobility   Outcome Measure  Help needed turning from your back to your side while in a flat bed without using bedrails?: None Help needed moving from lying on your  back to sitting on the side of a flat bed without using bedrails?: None Help needed moving to and from a bed to a chair (including a wheelchair)?: A Little Help needed standing up from a chair using your arms (e.g., wheelchair or bedside chair)?: A Little Help needed to walk in hospital room?: A Little Help needed climbing 3-5 steps with a railing? : A Lot 6 Click Score: 19    End of Session Equipment Utilized During Treatment: Gait belt;Other (comment) (CAM boot) Activity Tolerance: Patient tolerated treatment well Patient left: in chair;with call bell/phone within reach;with chair alarm set Nurse Communication: Mobility status PT Visit Diagnosis: Other abnormalities of gait and mobility (R26.89);History of falling (Z91.81);Difficulty in walking, not elsewhere classified (R26.2);Pain Pain - Right/Left: Right Pain - part of body: Ankle and joints of foot     Time: 1446-1515 PT Time Calculation (min) (ACUTE ONLY): 29 min  Charges:  $Gait Training: 8-22 mins $Therapeutic Exercise: 8-22 mins                       Wyona Almas, PT, DPT Acute Rehabilitation Services Pager [phone] Office [phone]    Deno Etienne 06/18/2020, 4:55 PM

## 2020-06-19 ENCOUNTER — Encounter (HOSPITAL_COMMUNITY): Payer: Self-pay | Admitting: Orthopaedic Surgery

## 2020-06-19 DIAGNOSIS — S8261XA Displaced fracture of lateral malleolus of right fibula, initial encounter for closed fracture: Secondary | ICD-10-CM | POA: Diagnosis not present

## 2020-06-19 DIAGNOSIS — S82251A Displaced comminuted fracture of shaft of right tibia, initial encounter for closed fracture: Secondary | ICD-10-CM | POA: Diagnosis not present

## 2020-06-19 DIAGNOSIS — F319 Bipolar disorder, unspecified: Secondary | ICD-10-CM | POA: Diagnosis not present

## 2020-06-19 DIAGNOSIS — G9009 Other idiopathic peripheral autonomic neuropathy: Secondary | ICD-10-CM | POA: Diagnosis not present

## 2020-06-19 DIAGNOSIS — Z79899 Other long term (current) drug therapy: Secondary | ICD-10-CM | POA: Diagnosis not present

## 2020-06-19 NOTE — TOC Progression Note (Signed)
Transition of Care [facility]) - Progression Note    Patient Details  Name: Monique Perry MRN: [id number] Date of Birth: [birth year]  Transition of [facility]) CM/SW Contact  Sharin Mons, RN Phone Number: [phone] 06/19/2020, 9:33 AM  Clinical Narrative:     Pending ... SNF bed offers, Ship broker, Programmer, applications (PASRR).  Pt will need updated COVID for SNF placement.  TOC team will continue to monitor and follow ....  Expected Discharge Plan: Skilled Nursing Facility Barriers to Discharge: No SNF bed, Insurance Authorization, [place] [facility])  Expected Discharge Plan and Services Expected Discharge Plan: [place]   Discharge Planning Services: CM Consult       Social Determinants of Health (SDOH) Interventions    Readmission Risk Interventions No flowsheet data found.

## 2020-06-19 NOTE — Progress Notes (Signed)
     Monique Perry is a 64 y.o. female   Orthopaedic diagnosis: Right distal tibia and fibula fracture status post intramedullary nailing  Subjective: Patient states the block wore off.  Her pain is now controlled but it was severe for a while.  She is awaiting bed availability at skilled nursing facility.  She has been working with physical therapy.  Objectyive: Vitals:   06/19/20 0332 06/19/20 1005  BP: 117/67 (!) 113/92  Pulse: 89 87  Resp: 15 18  Temp: (!) 97.4 F (36.3 C) 98.3 F (36.8 C)  SpO2: 95% 95%     Exam: Awake and alert Respirations even and unlabored No acute distress  Right leg with dressing in place.  Walking boot in place.  Able to wiggle toes.  Assessment: Postop day 2 status post intramedullary nail of right distal tibia and open treatment of fibula fracture, doing well   Plan: Continue to wait for skilled nursing facility bed offer.  Work with physical therapy Lovenox for DVT prophylaxis Finished perioperative and perioperative antibiotics Discontinue gabapentin Okay for discharge once skilled nursing facility bed available.   Radene Journey, MD

## 2020-06-19 NOTE — Plan of Care (Signed)
  Problem: Education: Goal: Knowledge of General Education information will improve Description: Including pain rating scale, medication(s)/side effects and non-pharmacologic comfort measures Outcome: Adequate for Discharge   Problem: Health Behavior/Discharge Planning: Goal: Ability to manage health-related needs will improve Outcome: Adequate for Discharge   Problem: Clinical Measurements: Goal: Will remain free from infection Outcome: Adequate for Discharge   Problem: Activity: Goal: Risk for activity intolerance will decrease Outcome: Adequate for Discharge   Problem: Nutrition: Goal: Adequate nutrition will be maintained Outcome: Adequate for Discharge   Problem: Safety: Goal: Ability to remain free from injury will improve Outcome: Adequate for Discharge

## 2020-06-19 NOTE — Plan of Care (Signed)

## 2020-06-20 DIAGNOSIS — S82251A Displaced comminuted fracture of shaft of right tibia, initial encounter for closed fracture: Secondary | ICD-10-CM | POA: Diagnosis not present

## 2020-06-20 DIAGNOSIS — Z79899 Other long term (current) drug therapy: Secondary | ICD-10-CM | POA: Diagnosis not present

## 2020-06-20 DIAGNOSIS — S8261XA Displaced fracture of lateral malleolus of right fibula, initial encounter for closed fracture: Secondary | ICD-10-CM | POA: Diagnosis not present

## 2020-06-20 DIAGNOSIS — F319 Bipolar disorder, unspecified: Secondary | ICD-10-CM | POA: Diagnosis not present

## 2020-06-20 DIAGNOSIS — G9009 Other idiopathic peripheral autonomic neuropathy: Secondary | ICD-10-CM | POA: Diagnosis not present

## 2020-06-20 MED ORDER — ENOXAPARIN SODIUM 40 MG/0.4ML ~~LOC~~ SOLN: 40.0000 mg | SUBCUTANEOUS | 0 refills | Status: DC

## 2020-06-20 MED ORDER — OXYCODONE HCL 5 MG PO TABS: 5.0000 mg | ORAL_TABLET | ORAL | 0 refills | Status: DC | PRN

## 2020-06-20 MED ORDER — NAPROXEN 250 MG PO TABS: 250.0000 mg | ORAL_TABLET | Freq: Two times a day (BID) | ORAL | 0 refills | Status: AC

## 2020-06-20 NOTE — Progress Notes (Signed)
     Monique Perry is a 64 y.o. female   Orthopaedic diagnosis: Right distal tibia and fibula fracture status post intramedullary nailing on 06/17/2020  Subjective: Patient is doing well.  Pain is controlled.  She has been up out of bed some.  Denies fevers or chills or chest pain.  Objectyive: Vitals:   06/19/20 1005 06/19/20 1939  BP: (!) 113/92 111/63  Pulse: 87 95  Resp: 18 16  Temp: 98.3 F (36.8 C) 98.3 F (36.8 C)  SpO2: 95% 99%     Exam: Awake and alert Respirations even and unlabored No acute distress  Right lower extremity with dressing in place.  Ankle and cam walker boot.  She able to wiggle toes.  Endorses sensation about the toes.  No saturation of the dressing.  Assessment: Postop day 3 status post intramedullary nailing of right tibia and fibula fracture   Plan: Awaiting skilled nursing facility placement. Continue to work with physical therapy for mobilization and maintenance of touchdown weightbearing restrictions Lovenox for DVT prophylaxis Regular diet with scheduled bowel regimen Patient to discharge with 2 weeks of Lovenox therapy Patient requires repeat coronavirus test prior to SNF placement   Radene Journey, MD

## 2020-06-20 NOTE — Discharge Instructions (Signed)
DR. Lucia Gaskins FOOT & ANKLE SURGERY POST-OP INSTRUCTIONS   Pain Management 1. Keep your foot elevated above heart level.  Make sure that your heel hangs free ('floats'). 2. Take all prescribed medication as directed. 3. If taking narcotic pain medication you may want to use an over-the-counter stool softener to avoid constipation. 4. You may take over-the-counter NSAIDs (ibuprofen, naproxen, etc.) as well as over-the-counter acetaminophen as directed on the packaging as a supplement for your pain and may also use it to wean away from the prescription medication.  Activity ? Touchdown weightbearing ? Keep splint intact  First Postoperative Visit 1. Your first postop visit will be at least 2 weeks after surgery.  This should be scheduled when you schedule surgery. 2. If you do not have a postoperative visit scheduled please call [phone] to schedule an appointment. 3. At the appointment your incision will be evaluated for suture removal, x-rays will be obtained if necessary.  General Instructions 1. Swelling is very common after foot and ankle surgery.  It often takes 3 months for the foot and ankle to begin to feel comfortable.  Some amount of swelling will persist for 6-12 months. 2. DO NOT change the dressing.  If there is a problem with the dressing (too tight, loose, gets wet, etc.) please contact Dr. Pollie Friar office. 3. DO NOT get the dressing wet.  For showers you can use an over-the-counter cast cover or wrap a washcloth around the top of your dressing and then cover it with a plastic bag and tape it to your leg. 4. DO NOT soak the incision (no tubs, pools, bath, etc.) until you have approval from Dr. Lucia Gaskins.  Contact Dr. Huel Cote office or go to Emergency Room if: 1. Temperature above 101 F. 2. Increasing pain that is unresponsive to pain medication or elevation 3. Excessive redness or swelling in your foot 4. Dressing problems - excessive bloody drainage, looseness or tightness, or if  dressing gets wet 5. Develop pain, swelling, warmth, or discoloration of your calf

## 2020-06-20 NOTE — TOC Progression Note (Signed)
Transition of Care [facility]) - Progression Note    Patient Details  Name: Monique Perry MRN: [id number] Date of Birth: 04/20/1956  Transition of Care [facility]) CM/SW Contact  Sharin Mons, RN Phone Number: [phone] 06/20/2020, 9:45 AM  Clinical Narrative:    [facility] offered and accepted by pt. PASRR pending... level 2 in person review completed. Insurance authorization initiated by SNF.  TOC team will continue to monitor for needs....    Expected Discharge Plan: Skilled Nursing Facility Barriers to Discharge: [place] (PASRR), Insurance Authorization  Expected Discharge Plan and Services Expected Discharge Plan: [place]   Discharge Planning Services: CM Consult                                 [facility] Agency: [facility] Date [place]: 06/17/20 Time [place]: 1414 Representative spoke with at [place]: [place] ([place]) Interventions    Readmission Risk Interventions No flowsheet data found.

## 2020-06-20 NOTE — Progress Notes (Signed)
Physical Therapy Treatment Patient Details Name: Monique Perry MRN: [id number] DOB: [birth year] Today's Date: 06/20/2020    History of Present Illness Patient is a 64 y/o female admitted with R tib/fib fracture from a fall.  Now s/p IM nail tibia and ORIF fibula.  PMH includes neuropathy, bipolar, arthritis, colitis, ETOH abuse.   PT Comments    Pt progressing well with mobility. Mobilizing well, but difficulty maintaining RLE TDWB precautions, seems to treat more as WBAT. Session focused on transfer and gait training while maintaining precautions; requires intermittent assist to maintain balance. Remains limited by generalized weakness, decreased activity tolerance and poor balance strategies/postural reactions; at high risk for falls. Lives home alone and will need to be modified independent to return home safely. Continue to recommend SNF-level therapies to maximize functional mobility and independence prior to return home.   Follow Up Recommendations  SNF;Supervision for mobility/OOB     Equipment Recommendations  None recommended by PT    Recommendations for Other Services       Precautions / Restrictions Precautions Precautions: Fall Required Braces or Orthoses: Other Brace Other Brace: RLE CAM walker boot Restrictions Weight Bearing Restrictions: Yes RLE Weight Bearing: Touchdown weight bearing    Mobility  Bed Mobility Overal bed mobility: Modified Independent Bed Mobility: Supine to Sit   Sidelying to sit: Modified independent (Device/Increase time);HOB elevated          Transfers Overall transfer level: Needs assistance Equipment used: Rolling walker (2 wheeled) Transfers: Sit to/from Stand Sit to Stand: Min assist         General transfer comment: Performed multiple sit<>stand from bed, mat table and recliner; repeated cues for hand placement and sequencing to maintain RLE TDWB precautions; poor eccentric control to  sit  Ambulation/Gait Ambulation/Gait assistance: Min assist Gait Distance (Feet): 50 Feet (+50) Assistive device: Rolling walker (2 wheeled)   Gait velocity: Decreased   General Gait Details: Pt with difficulty maintaining RLE TDWB precautions, continuing to walk as if WBAT in CAM walker boot; removed boot and practiced hopping on LLE, pt requiring assist to maintain balance but able to take complete small hops on L foot   Stairs             Wheelchair Mobility    Modified Rankin (Stroke Patients Only)       Balance Overall balance assessment: Needs assistance Sitting-balance support: Feet supported Sitting balance-Leahy Scale: Good     Standing balance support: Bilateral upper extremity supported Standing balance-Leahy Scale: Poor                              Cognition Arousal/Alertness: Awake/alert Behavior During Therapy: WFL for tasks assessed/performed Overall Cognitive Status: No family/caregiver present to determine baseline cognitive functioning Area of Impairment: Attention;Memory;Problem solving                   Current Attention Level: Selective Memory: Decreased short-term memory       Problem Solving: Requires verbal cues General Comments: Difficulty multitasking, at times requiring redirection to task; question STM deficits. Difficulty grasping RLE TDWB precautions as pt continues to treat as WBAT in cam walker boot despite max cues and education      Exercises General Exercises - Lower Extremity Long Arc Quad: AROM;Seated Hip Flexion/Marching: AROM;Seated    General Comments General comments (skin integrity, edema, etc.): CAM boot removed to examine lower foot, very swollen toes but pt able to wiggle  Pertinent Vitals/Pain Pain Assessment: Faces Faces Pain Scale: Hurts a little bit Pain Location: RLE Pain Descriptors / Indicators: Aching Pain Intervention(s): Monitored during session    Home Living                       Prior Function            PT Goals (current goals can now be found in the care plan section) Progress towards PT goals: Progressing toward goals    Frequency    Min 3X/week      PT Plan Current plan remains appropriate    Co-evaluation              AM-PAC PT "6 Clicks" Mobility   Outcome Measure  Help needed turning from your back to your side while in a flat bed without using bedrails?: None Help needed moving from lying on your back to sitting on the side of a flat bed without using bedrails?: None Help needed moving to and from a bed to a chair (including a wheelchair)?: A Little Help needed standing up from a chair using your arms (e.g., wheelchair or bedside chair)?: A Little Help needed to walk in hospital room?: A Little Help needed climbing 3-5 steps with a railing? : A Lot 6 Click Score: 19    End of Session Equipment Utilized During Treatment: Gait belt Activity Tolerance: Patient tolerated treatment well Patient left: in chair;with call bell/phone within reach;with chair alarm set Nurse Communication: Mobility status PT Visit Diagnosis: Other abnormalities of gait and mobility (R26.89);History of falling (Z91.81);Difficulty in walking, not elsewhere classified (R26.2);Pain Pain - Right/Left: Right Pain - part of body: Ankle and joints of foot     Time: 3893-7342 PT Time Calculation (min) (ACUTE ONLY): 24 min  Charges:  $Gait Training: 8-22 mins $Therapeutic Activity: 8-22 mins                     Mabeline Caras, PT, DPT Acute Rehabilitation Services  Pager [phone] Office [place] 06/20/2020, 12:24 PM

## 2020-06-21 DIAGNOSIS — S82251A Displaced comminuted fracture of shaft of right tibia, initial encounter for closed fracture: Secondary | ICD-10-CM | POA: Diagnosis not present

## 2020-06-21 DIAGNOSIS — Z79899 Other long term (current) drug therapy: Secondary | ICD-10-CM | POA: Diagnosis not present

## 2020-06-21 DIAGNOSIS — G9009 Other idiopathic peripheral autonomic neuropathy: Secondary | ICD-10-CM | POA: Diagnosis not present

## 2020-06-21 DIAGNOSIS — S8261XA Displaced fracture of lateral malleolus of right fibula, initial encounter for closed fracture: Secondary | ICD-10-CM | POA: Diagnosis not present

## 2020-06-21 DIAGNOSIS — F319 Bipolar disorder, unspecified: Secondary | ICD-10-CM | POA: Diagnosis not present

## 2020-06-21 NOTE — Plan of Care (Signed)

## 2020-06-21 NOTE — Progress Notes (Signed)
     Monique Perry is a 64 y.o. female of Dr. Lucia Gaskins, whom I am covering this weekend.  Orthopaedic diagnosis: Right distal tibia and fibula fracture status post intramedullary nailing on 06/17/2020  Subjective: Patient is doing well.  Pain is controlled, and she is conversant, but reports more proximal incisional pain today.  She has been up out of bed some.  Denies fevers or chills or chest pain.  Tol PO  Objectyive: Vitals:   06/21/20 0808 06/21/20 1539  BP: (!) 111/51 (!) 96/48  Pulse: 75 79  Resp: 17 17  Temp: 98.1 F (36.7 C) (!) 97.5 F (36.4 C)  SpO2: 94% 95%     Exam: Awake and alert Respirations even and unlabored No acute distress  Right lower extremity with dressing in place.  Ankle and cam walker boot.  She able to wiggle toes.  Endorses sensation about the toes c/w her baseline 2/2 neuropathy.  No saturation of the dressing.  Assessment: Postop day 4 status post intramedullary nailing of right tibia and fibula fracture   Plan: Continuing to await skilled nursing facility placement. Continue to work with physical therapy for mobilization and maintenance of touchdown weightbearing restrictions Lovenox for DVT prophylaxis Regular diet with scheduled bowel regimen Patient to discharge with 2 weeks of Lovenox therapy  Has been medically stable for d/c for at least 2 days.  Can D/C once suitable receiving facility identified and authorized.   Micheline Rough, MD

## 2020-06-21 NOTE — Progress Notes (Signed)
PASRR still pending. Will continue to follow.   Kingsley Spittle, LCSW Transitions of Care Department [phone]

## 2020-06-22 DIAGNOSIS — Z79899 Other long term (current) drug therapy: Secondary | ICD-10-CM | POA: Diagnosis not present

## 2020-06-22 DIAGNOSIS — F319 Bipolar disorder, unspecified: Secondary | ICD-10-CM | POA: Diagnosis not present

## 2020-06-22 DIAGNOSIS — G9009 Other idiopathic peripheral autonomic neuropathy: Secondary | ICD-10-CM | POA: Diagnosis not present

## 2020-06-22 DIAGNOSIS — S8261XA Displaced fracture of lateral malleolus of right fibula, initial encounter for closed fracture: Secondary | ICD-10-CM | POA: Diagnosis not present

## 2020-06-22 DIAGNOSIS — S82251A Displaced comminuted fracture of shaft of right tibia, initial encounter for closed fracture: Secondary | ICD-10-CM | POA: Diagnosis not present

## 2020-06-22 NOTE — Progress Notes (Signed)
     Monique Perry is a 64 y.o. female of Dr. Lucia Gaskins, whom I am covering this weekend as she awaits transfer to SNF.  Nothing eventful happened yesterday or overnight.  Orthopaedic diagnosis: Right distal tibia and fibula fracture status post intramedullary nailing on 06/17/2020  Subjective: Patient is doing well.  Pain is controlled, and she is conversant, no new c/o.  She has been up out of bed some.  Denies fevers or chills or chest pain.  Tol PO  Objectyive: Vitals:   06/21/20 1931 06/22/20 0402  BP: (!) 119/58 (!) 106/48  Pulse: 79 81  Resp: 15 17  Temp: 97.8 F (36.6 C) 97.9 F (36.6 C)  SpO2: 100% 94%     Exam: Awake and alert Respirations even and unlabored No acute distress  Right lower extremity with dressing in place.  Ankle and cam walker boot.  She able to wiggle toes.  Endorses sensation about the toes c/w her baseline 2/2 neuropathy.  No saturation of the dressing.  Assessment: Postop day 5 status post intramedullary nailing of right tibia and fibula fracture   Plan: Continuing to await skilled nursing facility placement. Continue to work with physical therapy for mobilization and maintenance of touchdown weightbearing restrictions Lovenox for DVT prophylaxis Regular diet with scheduled bowel regimen Patient to discharge with 2 weeks of Lovenox therapy  Has been medically stable for d/c for at least 3 days.  Can D/C once suitable receiving facility identified and authorized.  Dr. Lucia Gaskins to resume coverage tomorrow.   Micheline Rough, MD

## 2020-06-22 NOTE — TOC Progression Note (Signed)
Transition of Care [facility]) - Progression Note    Patient Details  Name: Monique Perry MRN: [id number] Date of Birth: 11-25-1956  Transition of Care [facility]) CM/SW [place], [place] Phone Number: 06/22/2020, 9:17 AM  Clinical Narrative:    Pt's PASRR Level 2 is still under review.  TOC Team will continue to follow for disposition planning.   Expected Discharge Plan: Skilled Nursing Facility Barriers to Discharge: [place] (PASRR), Insurance Authorization  Expected Discharge Plan and Services Expected Discharge Plan: [place]   Discharge Planning Services: CM Consult                                 [facility]: [facility] Date [place]: 06/17/20 Time [place]: 1414 Representative spoke with at [place]: [place] ([place]) Interventions    Readmission Risk Interventions No flowsheet data found.

## 2020-06-23 DIAGNOSIS — F1911 Other psychoactive substance abuse, in remission: Secondary | ICD-10-CM | POA: Diagnosis not present

## 2020-06-23 DIAGNOSIS — S82201D Unspecified fracture of shaft of right tibia, subsequent encounter for closed fracture with routine healing: Secondary | ICD-10-CM | POA: Diagnosis not present

## 2020-06-23 DIAGNOSIS — S82401A Unspecified fracture of shaft of right fibula, initial encounter for closed fracture: Secondary | ICD-10-CM | POA: Diagnosis not present

## 2020-06-23 DIAGNOSIS — Z7401 Bed confinement status: Secondary | ICD-10-CM | POA: Diagnosis not present

## 2020-06-23 DIAGNOSIS — F29 Unspecified psychosis not due to a substance or known physiological condition: Secondary | ICD-10-CM | POA: Diagnosis not present

## 2020-06-23 DIAGNOSIS — R634 Abnormal weight loss: Secondary | ICD-10-CM | POA: Diagnosis not present

## 2020-06-23 DIAGNOSIS — R41841 Cognitive communication deficit: Secondary | ICD-10-CM | POA: Diagnosis not present

## 2020-06-23 DIAGNOSIS — R262 Difficulty in walking, not elsewhere classified: Secondary | ICD-10-CM | POA: Diagnosis not present

## 2020-06-23 DIAGNOSIS — S82401D Unspecified fracture of shaft of right fibula, subsequent encounter for closed fracture with routine healing: Secondary | ICD-10-CM | POA: Diagnosis not present

## 2020-06-23 DIAGNOSIS — Z4789 Encounter for other orthopedic aftercare: Secondary | ICD-10-CM | POA: Diagnosis not present

## 2020-06-23 DIAGNOSIS — Q8901 Asplenia (congenital): Secondary | ICD-10-CM | POA: Diagnosis not present

## 2020-06-23 DIAGNOSIS — Z79899 Other long term (current) drug therapy: Secondary | ICD-10-CM | POA: Diagnosis not present

## 2020-06-23 DIAGNOSIS — R2689 Other abnormalities of gait and mobility: Secondary | ICD-10-CM | POA: Diagnosis not present

## 2020-06-23 DIAGNOSIS — R5381 Other malaise: Secondary | ICD-10-CM | POA: Diagnosis not present

## 2020-06-23 DIAGNOSIS — S82301S Unspecified fracture of lower end of right tibia, sequela: Secondary | ICD-10-CM | POA: Diagnosis not present

## 2020-06-23 DIAGNOSIS — Z9181 History of falling: Secondary | ICD-10-CM | POA: Diagnosis not present

## 2020-06-23 DIAGNOSIS — S82201A Unspecified fracture of shaft of right tibia, initial encounter for closed fracture: Secondary | ICD-10-CM | POA: Diagnosis not present

## 2020-06-23 DIAGNOSIS — R0989 Other specified symptoms and signs involving the circulatory and respiratory systems: Secondary | ICD-10-CM | POA: Diagnosis not present

## 2020-06-23 DIAGNOSIS — S8261XA Displaced fracture of lateral malleolus of right fibula, initial encounter for closed fracture: Secondary | ICD-10-CM | POA: Diagnosis not present

## 2020-06-23 DIAGNOSIS — D539 Nutritional anemia, unspecified: Secondary | ICD-10-CM | POA: Diagnosis not present

## 2020-06-23 DIAGNOSIS — F17200 Nicotine dependence, unspecified, uncomplicated: Secondary | ICD-10-CM | POA: Diagnosis not present

## 2020-06-23 DIAGNOSIS — M6281 Muscle weakness (generalized): Secondary | ICD-10-CM | POA: Diagnosis not present

## 2020-06-23 DIAGNOSIS — F319 Bipolar disorder, unspecified: Secondary | ICD-10-CM | POA: Diagnosis not present

## 2020-06-23 DIAGNOSIS — S82391D Other fracture of lower end of right tibia, subsequent encounter for closed fracture with routine healing: Secondary | ICD-10-CM | POA: Diagnosis not present

## 2020-06-23 DIAGNOSIS — G6289 Other specified polyneuropathies: Secondary | ICD-10-CM | POA: Diagnosis not present

## 2020-06-23 DIAGNOSIS — M2550 Pain in unspecified joint: Secondary | ICD-10-CM | POA: Diagnosis not present

## 2020-06-23 DIAGNOSIS — R531 Weakness: Secondary | ICD-10-CM | POA: Diagnosis not present

## 2020-06-23 DIAGNOSIS — S82251A Displaced comminuted fracture of shaft of right tibia, initial encounter for closed fracture: Secondary | ICD-10-CM | POA: Diagnosis not present

## 2020-06-23 DIAGNOSIS — G9009 Other idiopathic peripheral autonomic neuropathy: Secondary | ICD-10-CM | POA: Diagnosis not present

## 2020-06-23 MED ORDER — OXYCODONE HCL 5 MG PO TABS: 5.0000 mg | ORAL_TABLET | ORAL | 0 refills | Status: AC | PRN

## 2020-06-23 NOTE — Progress Notes (Signed)
Physical Therapy Treatment Patient Details Name: Monique Perry MRN: [id number] DOB: 07/15/1956 Today's Date: 06/23/2020    History of Present Illness Patient is a 63 y/o female admitted with R tib/fib fracture from a fall.  Now s/p IM nail tibia and ORIF fibula.  PMH includes neuropathy, bipolar, arthritis, colitis, ETOH abuse.    PT Comments    Pt in bed upon arrival of PT, agreeable to session with focus on progression of transfers and ambulation with improved maintenance of WB status. The pt was able to perform a series of sit-stand transfers and short bouts of ambulation with use of either TDWB or NWB with minA. The pt demos improved ability to make adjustments and identify movements that force her to break her precautions. She was then educated in a series of LE strengthening exercises to improve strength in her LLE to facilitate improved mobility and stability while maintaining precautions.    Follow Up Recommendations  SNF;Supervision for mobility/OOB     Equipment Recommendations  None recommended by PT (defer to post acute)    Recommendations for Other Services       Precautions / Restrictions Precautions Precautions: Fall Required Braces or Orthoses: Other Brace Other Brace: RLE CAM walker boot Restrictions Weight Bearing Restrictions: Yes RLE Weight Bearing: Touchdown weight bearing Other Position/Activity Restrictions: Pt with poor maintenance of TDWB, does well with NWB    Mobility  Bed Mobility Overal bed mobility: Modified Independent Bed Mobility: Supine to Sit   Sidelying to sit: Modified independent (Device/Increase time);HOB elevated       General bed mobility comments: No physical assist required to transition to edge of bed  Transfers Overall transfer level: Needs assistance Equipment used: Rolling walker (2 wheeled) Transfers: Sit to/from Stand Sit to Stand: Min assist         General transfer comment: completed multiple through session x4  at 3 different heights of bed with focus on maintaining TDWB or NWB through RLE. best with sit-stand from raised bed, able to maintain with sig cues and assist under her RLE to stand from low bed  Ambulation/Gait Ambulation/Gait assistance: Min assist Gait Distance (Feet): 5 Feet (+ 5 ft) Assistive device: Rolling walker (2 wheeled)   Gait velocity: Decreased Gait velocity interpretation: <1.31 ft/sec, indicative of household ambulator General Gait Details: pt not maintaining WB precautions when asked to ambulate initially, able to maintain with NWB (knee and hip flexed) and hop-to pattern.     Balance Overall balance assessment: Needs assistance Sitting-balance support: Feet supported Sitting balance-Leahy Scale: Good     Standing balance support: Bilateral upper extremity supported Standing balance-Leahy Scale: Poor                              Cognition Arousal/Alertness: Awake/alert Behavior During Therapy: WFL for tasks assessed/performed Overall Cognitive Status: No family/caregiver present to determine baseline cognitive functioning Area of Impairment: Memory;Problem solving                     Memory: Decreased short-term memory;Decreased recall of precautions       Problem Solving: Requires verbal cues General Comments: pt with some difficulty with WB precautions, reports she was told by a staff member that it was okay to "rock through heel-toe" when stepping but is unable to maintain TDWB with this pattern. Agreeable to all education provided in session and was able to maintain after initial corrections  Exercises General Exercises - Lower Extremity Long Arc Quad: Strengthening;Both;10 reps;Seated Hip Flexion/Marching: AROM;Both;10 reps;Seated Heel Raises: Strengthening;Left;15 reps (resisted)        Pertinent Vitals/Pain Pain Assessment: Faces Faces Pain Scale: Hurts little more Pain Location: RLE (shin-foot) Pain Descriptors /  Indicators: Aching;Grimacing;Sharp;Throbbing Pain Intervention(s): Limited activity within patient's tolerance;Monitored during session           PT Goals (current goals can now be found in the care plan section) Acute Rehab PT Goals Patient Stated Goal: go to rehab PT Goal Formulation: With patient Time For Goal Achievement: 07/01/20 Potential to Achieve Goals: Good Progress towards PT goals: Progressing toward goals    Frequency    Min 3X/week      PT Plan Current plan remains appropriate       AM-PAC PT "6 Clicks" Mobility   Outcome Measure  Help needed turning from your back to your side while in a flat bed without using bedrails?: None Help needed moving from lying on your back to sitting on the side of a flat bed without using bedrails?: None Help needed moving to and from a bed to a chair (including a wheelchair)?: A Little Help needed standing up from a chair using your arms (e.g., wheelchair or bedside chair)?: A Little Help needed to walk in hospital room?: A Little Help needed climbing 3-5 steps with a railing? : A Lot 6 Click Score: 19    End of Session Equipment Utilized During Treatment: Gait belt Activity Tolerance: Patient tolerated treatment well Patient left: in chair;with call bell/phone within reach;with chair alarm set Nurse Communication: Mobility status PT Visit Diagnosis: Other abnormalities of gait and mobility (R26.89);History of falling (Z91.81);Difficulty in walking, not elsewhere classified (R26.2);Pain Pain - Right/Left: Right Pain - part of body: Ankle and joints of foot     Time: 8115-7262 PT Time Calculation (min) (ACUTE ONLY): 47 min  Charges:  $Gait Training: 23-37 mins $Therapeutic Exercise: 8-22 mins                     Karma Ganja, PT, DPT   Acute Rehabilitation Department Pager #: [phone]   Otho Bellows 06/23/2020, 12:37 PM

## 2020-06-23 NOTE — Progress Notes (Signed)
Report called to [facility] at [facility]. All questions answered. Pt belongings gathered to be sent with her.

## 2020-06-23 NOTE — Discharge Summary (Signed)
Patient ID: Monique Perry MRN: [id number] DOB/AGE: [birth year] 64 y.o.  Admit date: 06/17/2020 Discharge date: 06/23/2020  Admission Diagnoses:  Active Problems:   Tibia fracture   Discharge Diagnoses:  Same  Past Medical History:  Diagnosis Date  . Alcohol abuse last February 2012  . Arthritis   . Bipolar disorder ([place])   . Collagenous colitis   . Drug abuse ([place]) last 1990's  . Peripheral neuropathy 11/29/2018  . Pneumonia     Surgeries: Procedure(s): OPERATIVE TREATMENT OF RIGHT TIBIA AND FIBULA FRACTURE on 06/17/2020   Consultants:   Discharged Condition: Improved  Hospital Course: Monique Perry is an 64 y.o. female who was admitted 06/17/2020 for operative treatment ofright tibia and fibula. Patient has severe unremitting pain that affects sleep, daily activities, and work/hobbies. After pre-op clearance the patient was taken to the operating room on 06/17/2020 and underwent  Procedure(s): OPERATIVE TREATMENT OF RIGHT TIBIA AND FIBULA FRACTURE.    Patient had an uneventful postoperative course.  She was suitable for discharge to skilled nursing facility on the day of discharge.  She was able to mobilize with physical therapy and maintain touchdown weightbearing status.  Pain was controlled.  Exam: Right lower extremity with dressing in place.  Dressing without saturation.  Walking boot in place.  Sensation intact to the toes.  Able to wiggle toes.  Patient was given perioperative antibiotics:  Anti-infectives (From admission, onward)   Start     Dose/Rate Route Frequency Ordered Stop   06/17/20 1400  ceFAZolin (ANCEF) IVPB 1 g/50 mL premix        1 g 100 mL/hr over 30 Minutes Intravenous Every 8 hours 06/17/20 1051 06/18/20 0715   06/17/20 0600  ceFAZolin (ANCEF) IVPB 2g/100 mL premix        2 g 200 mL/hr over 30 Minutes Intravenous On call to O.R. 06/17/20 5035 06/17/20 0726       Patient was given sequential compression devices, early ambulation, and  chemoprophylaxis to prevent DVT.  Patient benefited maximally from hospital stay and there were no complications.    Recent vital signs:  Patient Vitals for the past 24 hrs:  BP Temp Temp src Pulse Resp SpO2  06/23/20 0748 (!) 115/51 (!) 97.4 F (36.3 C) Oral 76 17 94 %  06/23/20 0311 (!) 131/57 (!) 97.4 F (36.3 C) Oral 83 18 95 %  06/22/20 1920 (!) 105/54 98.2 F (36.8 C) Oral 80 14 95 %     Recent laboratory studies: No results for input(s): WBC, HGB, HCT, PLT, NA, K, CL, CO2, BUN, CREATININE, GLUCOSE, INR, CALCIUM in the last 72 hours.  Invalid input(s): PT, 2   Discharge Medications:   Allergies as of 06/23/2020      Reactions   Erythrocin Other (See Comments)   Thrush   Sulfa Antibiotics Other (See Comments)   Thrush   Trazodone And Nefazodone Other (See Comments)   Severe insomnia   Tramadol Itching      Medication List    STOP taking these medications   HYDROcodone-acetaminophen 5-325 MG tablet Commonly known as: NORCO/VICODIN     TAKE these medications   acetaminophen 325 MG tablet Commonly known as: TYLENOL Take 650 mg by mouth every 6 (six) hours as needed for headache.   amitriptyline 75 MG tablet Commonly known as: ELAVIL Take 75 mg by mouth in the morning and at bedtime.   enoxaparin 40 MG/0.4ML injection Commonly known as: LOVENOX Inject 0.4 mLs (40 mg total) into the skin  daily for 14 doses.   ibuprofen 200 MG tablet Commonly known as: ADVIL Take 800 mg by mouth every 6 (six) hours as needed for moderate pain.   mirtazapine 30 MG tablet Commonly known as: REMERON Take 30 mg by mouth at bedtime.   naproxen 250 MG tablet Commonly known as: NAPROSYN Take 1 tablet (250 mg total) by mouth 2 (two) times daily with a meal.   oxyCODONE 5 MG immediate release tablet Commonly known as: Roxicodone Take 1 tablet (5 mg total) by mouth every 4 (four) hours as needed for up to 7 days.       Diagnostic Studies: DG Tibia/Fibula Right  Result  Date: 06/17/2020 CLINICAL DATA:  Fixation for fracture EXAM: RIGHT TIBIA AND FIBULA - 2 VIEW; DG C-ARM 1-60 MIN COMPARISON:  Right ankle radiographs July 10, 2020 FLUOROSCOPY TIME:  2 minutes 50 seconds; 4.03 mGy; 6 acquired images FINDINGS: Frontal and lateral views were obtained. There is screw and nail fixation through a comminuted spiral fracture of the distal tibia. There is screw fixation through the lateral malleolus, transfixing a fracture in this area. Alignment is essentially anatomic at the fracture sites post hardware placement. No new fracture. No dislocation. Ankle mortise appears intact. IMPRESSION: Fixation through fractures of the distal tibia and fibula with alignment essentially anatomic post surgical fixation in these areas. Support hardware intact. No new fracture. No dislocation. Ankle mortise appears intact. Electronically Signed   By: Lowella Grip III M.D.   On: 06/17/2020 09:17   DG Ankle Complete Right  Result Date: 06/10/2020 CLINICAL DATA:  Pain following fall EXAM: RIGHT ANKLE - COMPLETE 3+ VIEW COMPARISON:  November 04, 2017 FINDINGS: Frontal, oblique, and lateral views were obtained. There is marked soft tissue swelling laterally. There is a fracture of the distal fibula at the proximal lateral malleolar level with alignment near anatomic. There is a spiral fracture of the distal tibial diaphysis with fracture fragments in near anatomic alignment. There is a small joint effusion. There is no appreciable joint space narrowing or erosion. Postoperative changes noted in the proximal first metatarsal. Ankle mortise appears grossly intact. IMPRESSION: Spiral fracture distal tibial diaphysis with alignment near anatomic. Essentially nondisplaced fracture of the distal fibula at the proximal lateral malleolar level. Small joint effusion. Marked soft tissue swelling laterally. Ankle mortise appears grossly intact. Postoperative change proximal first metatarsal. Electronically Signed    By: Lowella Grip III M.D.   On: 06/10/2020 19:07   DG C-Arm 1-60 Min  Result Date: 06/17/2020 CLINICAL DATA:  Fixation for fracture EXAM: RIGHT TIBIA AND FIBULA - 2 VIEW; DG C-ARM 1-60 MIN COMPARISON:  Right ankle radiographs July 10, 2020 FLUOROSCOPY TIME:  2 minutes 50 seconds; 4.03 mGy; 6 acquired images FINDINGS: Frontal and lateral views were obtained. There is screw and nail fixation through a comminuted spiral fracture of the distal tibia. There is screw fixation through the lateral malleolus, transfixing a fracture in this area. Alignment is essentially anatomic at the fracture sites post hardware placement. No new fracture. No dislocation. Ankle mortise appears intact. IMPRESSION: Fixation through fractures of the distal tibia and fibula with alignment essentially anatomic post surgical fixation in these areas. Support hardware intact. No new fracture. No dislocation. Ankle mortise appears intact. Electronically Signed   By: Lowella Grip III M.D.   On: 06/17/2020 09:17    Disposition: Discharge disposition: 03-Skilled Nursing Facility       Discharge orders: Touchdown weightbearing to right lower extremity. Boot in place while mobilizing  okay to remove while in bed Follow-up in 2 weeks for wound check, x-rays of the right tibia and suture removal if appropriate Call the office with concerns.     Contact information for follow-up providers    Erle Crocker, MD. Schedule an appointment as soon as possible for a visit in 2 weeks.   Specialty: Orthopedic Surgery Contact information: [address] [place] [address] [phone]            Contact information for after-discharge care    Destination    [facility] .   Service: Skilled Nursing Contact information: [place] [phone]                   Signed: Erle Crocker 06/23/2020, 12:35 PM

## 2020-06-23 NOTE — TOC Transition Note (Signed)
Transition of Care [facility]) - CM/SW Discharge Note   Patient Details  Name: Ramisa Duman MRN: [id number] Date of Birth: [birth year]  Transition of [facility] CM/SW Contact:  Sharin Mons, RN Phone Number: 06/23/2020, 12:02 PM   Clinical Narrative:     Patient will DC to: [facility] date: 11/10/1956 Family notified: pt states will notify mom Transport by: Corey Harold   Per MD patient ready for DC today . RN, patient, patient's family, and facility notified of DC. Discharge Summary and FL2 sent to facility. RN to call report prior to discharge [phone]). Rm  # 106 P DC packet on chart. Ambulance transport requested for patient.   NCM will sign off for now as intervention is no longer needed. Please consult [place] again if new needs arise.   Final next level of care: [facility] [facility]) Barriers to Discharge: No Barriers Identified   Patient Goals and CMS Choice Patient states their goals for this hospitalization and ongoing recovery are:: to get better and get home to my cat CMS Medicare.gov Compare Post Acute Care list provided to:: Patient Choice offered to / list presented to : Patient  Discharge Placement                       Discharge Plan and Services   Discharge Planning Services: CM Consult                        [facility]: [facility] Date [place]: 06/17/20 Time [place]: 1414 Representative spoke with at [place]: [place] ([place]) Interventions     Readmission Risk Interventions No flowsheet data found.

## 2020-06-24 DIAGNOSIS — F17200 Nicotine dependence, unspecified, uncomplicated: Secondary | ICD-10-CM | POA: Diagnosis not present

## 2020-06-24 DIAGNOSIS — R2689 Other abnormalities of gait and mobility: Secondary | ICD-10-CM | POA: Diagnosis not present

## 2020-06-24 DIAGNOSIS — D539 Nutritional anemia, unspecified: Secondary | ICD-10-CM | POA: Diagnosis not present

## 2020-06-24 DIAGNOSIS — Q8901 Asplenia (congenital): Secondary | ICD-10-CM | POA: Diagnosis not present

## 2020-06-24 DIAGNOSIS — R0989 Other specified symptoms and signs involving the circulatory and respiratory systems: Secondary | ICD-10-CM | POA: Diagnosis not present

## 2020-06-24 DIAGNOSIS — R634 Abnormal weight loss: Secondary | ICD-10-CM | POA: Diagnosis not present

## 2020-06-24 DIAGNOSIS — F1911 Other psychoactive substance abuse, in remission: Secondary | ICD-10-CM | POA: Diagnosis not present

## 2020-06-24 DIAGNOSIS — G6289 Other specified polyneuropathies: Secondary | ICD-10-CM | POA: Diagnosis not present

## 2020-06-24 DIAGNOSIS — S82201A Unspecified fracture of shaft of right tibia, initial encounter for closed fracture: Secondary | ICD-10-CM | POA: Diagnosis not present

## 2020-07-02 ENCOUNTER — Other Ambulatory Visit: Payer: Self-pay | Admitting: Family Medicine

## 2020-07-02 DIAGNOSIS — F1911 Other psychoactive substance abuse, in remission: Secondary | ICD-10-CM | POA: Diagnosis not present

## 2020-07-02 DIAGNOSIS — G6289 Other specified polyneuropathies: Secondary | ICD-10-CM | POA: Diagnosis not present

## 2020-07-02 DIAGNOSIS — Z1231 Encounter for screening mammogram for malignant neoplasm of breast: Secondary | ICD-10-CM

## 2020-07-02 DIAGNOSIS — R5381 Other malaise: Secondary | ICD-10-CM

## 2020-07-02 DIAGNOSIS — S82201D Unspecified fracture of shaft of right tibia, subsequent encounter for closed fracture with routine healing: Secondary | ICD-10-CM | POA: Diagnosis not present

## 2020-07-02 DIAGNOSIS — S82401D Unspecified fracture of shaft of right fibula, subsequent encounter for closed fracture with routine healing: Secondary | ICD-10-CM | POA: Diagnosis not present

## 2020-07-02 DIAGNOSIS — R0989 Other specified symptoms and signs involving the circulatory and respiratory systems: Secondary | ICD-10-CM | POA: Diagnosis not present

## 2020-07-03 ENCOUNTER — Other Ambulatory Visit: Payer: Self-pay | Admitting: Family Medicine

## 2020-07-03 DIAGNOSIS — E2839 Other primary ovarian failure: Secondary | ICD-10-CM

## 2020-07-04 DIAGNOSIS — S82391D Other fracture of lower end of right tibia, subsequent encounter for closed fracture with routine healing: Secondary | ICD-10-CM | POA: Diagnosis not present

## 2020-07-07 DIAGNOSIS — S82401A Unspecified fracture of shaft of right fibula, initial encounter for closed fracture: Secondary | ICD-10-CM | POA: Diagnosis not present

## 2020-07-07 DIAGNOSIS — S82201A Unspecified fracture of shaft of right tibia, initial encounter for closed fracture: Secondary | ICD-10-CM | POA: Diagnosis not present

## 2020-07-07 DIAGNOSIS — F1911 Other psychoactive substance abuse, in remission: Secondary | ICD-10-CM | POA: Diagnosis not present

## 2020-07-07 DIAGNOSIS — R634 Abnormal weight loss: Secondary | ICD-10-CM | POA: Diagnosis not present

## 2020-07-10 DIAGNOSIS — S82201A Unspecified fracture of shaft of right tibia, initial encounter for closed fracture: Secondary | ICD-10-CM | POA: Diagnosis not present

## 2020-07-10 DIAGNOSIS — D539 Nutritional anemia, unspecified: Secondary | ICD-10-CM | POA: Diagnosis not present

## 2020-07-10 DIAGNOSIS — S82401A Unspecified fracture of shaft of right fibula, initial encounter for closed fracture: Secondary | ICD-10-CM | POA: Diagnosis not present

## 2020-07-10 DIAGNOSIS — R0989 Other specified symptoms and signs involving the circulatory and respiratory systems: Secondary | ICD-10-CM | POA: Diagnosis not present

## 2020-07-10 DIAGNOSIS — F1911 Other psychoactive substance abuse, in remission: Secondary | ICD-10-CM | POA: Diagnosis not present

## 2020-07-10 DIAGNOSIS — R2689 Other abnormalities of gait and mobility: Secondary | ICD-10-CM | POA: Diagnosis not present

## 2020-07-10 DIAGNOSIS — F17200 Nicotine dependence, unspecified, uncomplicated: Secondary | ICD-10-CM | POA: Diagnosis not present

## 2020-07-10 DIAGNOSIS — G6289 Other specified polyneuropathies: Secondary | ICD-10-CM | POA: Diagnosis not present

## 2020-07-13 DIAGNOSIS — I739 Peripheral vascular disease, unspecified: Secondary | ICD-10-CM | POA: Diagnosis not present

## 2020-07-13 DIAGNOSIS — F17210 Nicotine dependence, cigarettes, uncomplicated: Secondary | ICD-10-CM | POA: Diagnosis not present

## 2020-07-13 DIAGNOSIS — D649 Anemia, unspecified: Secondary | ICD-10-CM | POA: Diagnosis not present

## 2020-07-13 DIAGNOSIS — S82391D Other fracture of lower end of right tibia, subsequent encounter for closed fracture with routine healing: Secondary | ICD-10-CM | POA: Diagnosis not present

## 2020-07-13 DIAGNOSIS — M1991 Primary osteoarthritis, unspecified site: Secondary | ICD-10-CM | POA: Diagnosis not present

## 2020-07-13 DIAGNOSIS — S82401D Unspecified fracture of shaft of right fibula, subsequent encounter for closed fracture with routine healing: Secondary | ICD-10-CM | POA: Diagnosis not present

## 2020-07-13 DIAGNOSIS — K219 Gastro-esophageal reflux disease without esophagitis: Secondary | ICD-10-CM | POA: Diagnosis not present

## 2020-07-13 DIAGNOSIS — F319 Bipolar disorder, unspecified: Secondary | ICD-10-CM | POA: Diagnosis not present

## 2020-07-13 DIAGNOSIS — G629 Polyneuropathy, unspecified: Secondary | ICD-10-CM | POA: Diagnosis not present

## 2020-07-15 ENCOUNTER — Other Ambulatory Visit: Payer: Self-pay

## 2020-07-15 DIAGNOSIS — F17210 Nicotine dependence, cigarettes, uncomplicated: Secondary | ICD-10-CM | POA: Diagnosis not present

## 2020-07-15 DIAGNOSIS — I739 Peripheral vascular disease, unspecified: Secondary | ICD-10-CM | POA: Diagnosis not present

## 2020-07-15 DIAGNOSIS — S82391D Other fracture of lower end of right tibia, subsequent encounter for closed fracture with routine healing: Secondary | ICD-10-CM | POA: Diagnosis not present

## 2020-07-15 DIAGNOSIS — S82401D Unspecified fracture of shaft of right fibula, subsequent encounter for closed fracture with routine healing: Secondary | ICD-10-CM | POA: Diagnosis not present

## 2020-07-15 DIAGNOSIS — G629 Polyneuropathy, unspecified: Secondary | ICD-10-CM | POA: Diagnosis not present

## 2020-07-15 DIAGNOSIS — F319 Bipolar disorder, unspecified: Secondary | ICD-10-CM | POA: Diagnosis not present

## 2020-07-15 DIAGNOSIS — K219 Gastro-esophageal reflux disease without esophagitis: Secondary | ICD-10-CM | POA: Diagnosis not present

## 2020-07-15 DIAGNOSIS — M1991 Primary osteoarthritis, unspecified site: Secondary | ICD-10-CM | POA: Diagnosis not present

## 2020-07-15 DIAGNOSIS — D649 Anemia, unspecified: Secondary | ICD-10-CM | POA: Diagnosis not present

## 2020-07-15 NOTE — Patient Outreach (Signed)
[place] [facility]) Care Management  07/15/2020  Monique Perry 08/24/1956 [id number]   Referral Date: 07/15/20 Referral Source: [employer] Report Date of Discharge: 07/11/20 Facility: [place]: [facility]   Referral received.  No outreach warranted at this time.  Transition of Care calls being completed via EMMI. RN CM will outreach patient for any red flags received.    Plan: RN CM will close case.    [name], RN, MSN [facility] Care Management Care Management Coordinator Direct Line [phone] Toll Free: [phone]  Fax: [phone]

## 2020-07-18 DIAGNOSIS — S82401D Unspecified fracture of shaft of right fibula, subsequent encounter for closed fracture with routine healing: Secondary | ICD-10-CM | POA: Diagnosis not present

## 2020-07-18 DIAGNOSIS — S82391D Other fracture of lower end of right tibia, subsequent encounter for closed fracture with routine healing: Secondary | ICD-10-CM | POA: Diagnosis not present

## 2020-07-18 DIAGNOSIS — G629 Polyneuropathy, unspecified: Secondary | ICD-10-CM | POA: Diagnosis not present

## 2020-07-18 DIAGNOSIS — K219 Gastro-esophageal reflux disease without esophagitis: Secondary | ICD-10-CM | POA: Diagnosis not present

## 2020-07-18 DIAGNOSIS — M1991 Primary osteoarthritis, unspecified site: Secondary | ICD-10-CM | POA: Diagnosis not present

## 2020-07-18 DIAGNOSIS — F17210 Nicotine dependence, cigarettes, uncomplicated: Secondary | ICD-10-CM | POA: Diagnosis not present

## 2020-07-18 DIAGNOSIS — D649 Anemia, unspecified: Secondary | ICD-10-CM | POA: Diagnosis not present

## 2020-07-18 DIAGNOSIS — I739 Peripheral vascular disease, unspecified: Secondary | ICD-10-CM | POA: Diagnosis not present

## 2020-07-18 DIAGNOSIS — F319 Bipolar disorder, unspecified: Secondary | ICD-10-CM | POA: Diagnosis not present

## 2020-07-22 DIAGNOSIS — M1991 Primary osteoarthritis, unspecified site: Secondary | ICD-10-CM | POA: Diagnosis not present

## 2020-07-22 DIAGNOSIS — D649 Anemia, unspecified: Secondary | ICD-10-CM | POA: Diagnosis not present

## 2020-07-22 DIAGNOSIS — G629 Polyneuropathy, unspecified: Secondary | ICD-10-CM | POA: Diagnosis not present

## 2020-07-22 DIAGNOSIS — F319 Bipolar disorder, unspecified: Secondary | ICD-10-CM | POA: Diagnosis not present

## 2020-07-22 DIAGNOSIS — S82401D Unspecified fracture of shaft of right fibula, subsequent encounter for closed fracture with routine healing: Secondary | ICD-10-CM | POA: Diagnosis not present

## 2020-07-22 DIAGNOSIS — I739 Peripheral vascular disease, unspecified: Secondary | ICD-10-CM | POA: Diagnosis not present

## 2020-07-22 DIAGNOSIS — F17210 Nicotine dependence, cigarettes, uncomplicated: Secondary | ICD-10-CM | POA: Diagnosis not present

## 2020-07-22 DIAGNOSIS — K219 Gastro-esophageal reflux disease without esophagitis: Secondary | ICD-10-CM | POA: Diagnosis not present

## 2020-07-22 DIAGNOSIS — S82391D Other fracture of lower end of right tibia, subsequent encounter for closed fracture with routine healing: Secondary | ICD-10-CM | POA: Diagnosis not present

## 2020-07-23 DIAGNOSIS — M1991 Primary osteoarthritis, unspecified site: Secondary | ICD-10-CM | POA: Diagnosis not present

## 2020-07-23 DIAGNOSIS — F319 Bipolar disorder, unspecified: Secondary | ICD-10-CM | POA: Diagnosis not present

## 2020-07-23 DIAGNOSIS — G629 Polyneuropathy, unspecified: Secondary | ICD-10-CM | POA: Diagnosis not present

## 2020-07-23 DIAGNOSIS — I739 Peripheral vascular disease, unspecified: Secondary | ICD-10-CM | POA: Diagnosis not present

## 2020-07-23 DIAGNOSIS — S82391D Other fracture of lower end of right tibia, subsequent encounter for closed fracture with routine healing: Secondary | ICD-10-CM | POA: Diagnosis not present

## 2020-07-23 DIAGNOSIS — K219 Gastro-esophageal reflux disease without esophagitis: Secondary | ICD-10-CM | POA: Diagnosis not present

## 2020-07-23 DIAGNOSIS — D649 Anemia, unspecified: Secondary | ICD-10-CM | POA: Diagnosis not present

## 2020-07-23 DIAGNOSIS — S82401D Unspecified fracture of shaft of right fibula, subsequent encounter for closed fracture with routine healing: Secondary | ICD-10-CM | POA: Diagnosis not present

## 2020-07-23 DIAGNOSIS — F17210 Nicotine dependence, cigarettes, uncomplicated: Secondary | ICD-10-CM | POA: Diagnosis not present

## 2020-07-24 DIAGNOSIS — F319 Bipolar disorder, unspecified: Secondary | ICD-10-CM | POA: Diagnosis not present

## 2020-07-24 DIAGNOSIS — M1991 Primary osteoarthritis, unspecified site: Secondary | ICD-10-CM | POA: Diagnosis not present

## 2020-07-24 DIAGNOSIS — S82401D Unspecified fracture of shaft of right fibula, subsequent encounter for closed fracture with routine healing: Secondary | ICD-10-CM | POA: Diagnosis not present

## 2020-07-24 DIAGNOSIS — I739 Peripheral vascular disease, unspecified: Secondary | ICD-10-CM | POA: Diagnosis not present

## 2020-07-24 DIAGNOSIS — K219 Gastro-esophageal reflux disease without esophagitis: Secondary | ICD-10-CM | POA: Diagnosis not present

## 2020-07-24 DIAGNOSIS — F17210 Nicotine dependence, cigarettes, uncomplicated: Secondary | ICD-10-CM | POA: Diagnosis not present

## 2020-07-24 DIAGNOSIS — G629 Polyneuropathy, unspecified: Secondary | ICD-10-CM | POA: Diagnosis not present

## 2020-07-24 DIAGNOSIS — S82391D Other fracture of lower end of right tibia, subsequent encounter for closed fracture with routine healing: Secondary | ICD-10-CM | POA: Diagnosis not present

## 2020-07-24 DIAGNOSIS — D649 Anemia, unspecified: Secondary | ICD-10-CM | POA: Diagnosis not present

## 2020-07-29 DIAGNOSIS — K219 Gastro-esophageal reflux disease without esophagitis: Secondary | ICD-10-CM | POA: Diagnosis not present

## 2020-07-29 DIAGNOSIS — G629 Polyneuropathy, unspecified: Secondary | ICD-10-CM | POA: Diagnosis not present

## 2020-07-29 DIAGNOSIS — F17210 Nicotine dependence, cigarettes, uncomplicated: Secondary | ICD-10-CM | POA: Diagnosis not present

## 2020-07-29 DIAGNOSIS — S82391D Other fracture of lower end of right tibia, subsequent encounter for closed fracture with routine healing: Secondary | ICD-10-CM | POA: Diagnosis not present

## 2020-07-29 DIAGNOSIS — M1991 Primary osteoarthritis, unspecified site: Secondary | ICD-10-CM | POA: Diagnosis not present

## 2020-07-29 DIAGNOSIS — F319 Bipolar disorder, unspecified: Secondary | ICD-10-CM | POA: Diagnosis not present

## 2020-07-29 DIAGNOSIS — I739 Peripheral vascular disease, unspecified: Secondary | ICD-10-CM | POA: Diagnosis not present

## 2020-07-29 DIAGNOSIS — D649 Anemia, unspecified: Secondary | ICD-10-CM | POA: Diagnosis not present

## 2020-07-29 DIAGNOSIS — S82401D Unspecified fracture of shaft of right fibula, subsequent encounter for closed fracture with routine healing: Secondary | ICD-10-CM | POA: Diagnosis not present

## 2020-07-30 DIAGNOSIS — S82391D Other fracture of lower end of right tibia, subsequent encounter for closed fracture with routine healing: Secondary | ICD-10-CM | POA: Diagnosis not present

## 2020-07-30 DIAGNOSIS — M1991 Primary osteoarthritis, unspecified site: Secondary | ICD-10-CM | POA: Diagnosis not present

## 2020-07-30 DIAGNOSIS — K219 Gastro-esophageal reflux disease without esophagitis: Secondary | ICD-10-CM | POA: Diagnosis not present

## 2020-07-30 DIAGNOSIS — D649 Anemia, unspecified: Secondary | ICD-10-CM | POA: Diagnosis not present

## 2020-07-30 DIAGNOSIS — G629 Polyneuropathy, unspecified: Secondary | ICD-10-CM | POA: Diagnosis not present

## 2020-07-30 DIAGNOSIS — F319 Bipolar disorder, unspecified: Secondary | ICD-10-CM | POA: Diagnosis not present

## 2020-07-30 DIAGNOSIS — F17210 Nicotine dependence, cigarettes, uncomplicated: Secondary | ICD-10-CM | POA: Diagnosis not present

## 2020-07-30 DIAGNOSIS — I739 Peripheral vascular disease, unspecified: Secondary | ICD-10-CM | POA: Diagnosis not present

## 2020-07-30 DIAGNOSIS — S82401D Unspecified fracture of shaft of right fibula, subsequent encounter for closed fracture with routine healing: Secondary | ICD-10-CM | POA: Diagnosis not present

## 2020-07-31 DIAGNOSIS — I739 Peripheral vascular disease, unspecified: Secondary | ICD-10-CM | POA: Diagnosis not present

## 2020-07-31 DIAGNOSIS — M1991 Primary osteoarthritis, unspecified site: Secondary | ICD-10-CM | POA: Diagnosis not present

## 2020-07-31 DIAGNOSIS — K219 Gastro-esophageal reflux disease without esophagitis: Secondary | ICD-10-CM | POA: Diagnosis not present

## 2020-07-31 DIAGNOSIS — S82401D Unspecified fracture of shaft of right fibula, subsequent encounter for closed fracture with routine healing: Secondary | ICD-10-CM | POA: Diagnosis not present

## 2020-07-31 DIAGNOSIS — F17210 Nicotine dependence, cigarettes, uncomplicated: Secondary | ICD-10-CM | POA: Diagnosis not present

## 2020-07-31 DIAGNOSIS — F319 Bipolar disorder, unspecified: Secondary | ICD-10-CM | POA: Diagnosis not present

## 2020-07-31 DIAGNOSIS — G629 Polyneuropathy, unspecified: Secondary | ICD-10-CM | POA: Diagnosis not present

## 2020-07-31 DIAGNOSIS — D649 Anemia, unspecified: Secondary | ICD-10-CM | POA: Diagnosis not present

## 2020-07-31 DIAGNOSIS — S82391D Other fracture of lower end of right tibia, subsequent encounter for closed fracture with routine healing: Secondary | ICD-10-CM | POA: Diagnosis not present

## 2020-08-01 DIAGNOSIS — S82391D Other fracture of lower end of right tibia, subsequent encounter for closed fracture with routine healing: Secondary | ICD-10-CM | POA: Diagnosis not present

## 2020-08-05 DIAGNOSIS — D649 Anemia, unspecified: Secondary | ICD-10-CM | POA: Diagnosis not present

## 2020-08-05 DIAGNOSIS — F17210 Nicotine dependence, cigarettes, uncomplicated: Secondary | ICD-10-CM | POA: Diagnosis not present

## 2020-08-05 DIAGNOSIS — I739 Peripheral vascular disease, unspecified: Secondary | ICD-10-CM | POA: Diagnosis not present

## 2020-08-05 DIAGNOSIS — M1991 Primary osteoarthritis, unspecified site: Secondary | ICD-10-CM | POA: Diagnosis not present

## 2020-08-05 DIAGNOSIS — S82391D Other fracture of lower end of right tibia, subsequent encounter for closed fracture with routine healing: Secondary | ICD-10-CM | POA: Diagnosis not present

## 2020-08-05 DIAGNOSIS — F319 Bipolar disorder, unspecified: Secondary | ICD-10-CM | POA: Diagnosis not present

## 2020-08-05 DIAGNOSIS — K219 Gastro-esophageal reflux disease without esophagitis: Secondary | ICD-10-CM | POA: Diagnosis not present

## 2020-08-05 DIAGNOSIS — S82401D Unspecified fracture of shaft of right fibula, subsequent encounter for closed fracture with routine healing: Secondary | ICD-10-CM | POA: Diagnosis not present

## 2020-08-05 DIAGNOSIS — G629 Polyneuropathy, unspecified: Secondary | ICD-10-CM | POA: Diagnosis not present

## 2020-08-06 DIAGNOSIS — S82401D Unspecified fracture of shaft of right fibula, subsequent encounter for closed fracture with routine healing: Secondary | ICD-10-CM | POA: Diagnosis not present

## 2020-08-06 DIAGNOSIS — M1991 Primary osteoarthritis, unspecified site: Secondary | ICD-10-CM | POA: Diagnosis not present

## 2020-08-06 DIAGNOSIS — S82391D Other fracture of lower end of right tibia, subsequent encounter for closed fracture with routine healing: Secondary | ICD-10-CM | POA: Diagnosis not present

## 2020-08-06 DIAGNOSIS — F319 Bipolar disorder, unspecified: Secondary | ICD-10-CM | POA: Diagnosis not present

## 2020-08-06 DIAGNOSIS — I739 Peripheral vascular disease, unspecified: Secondary | ICD-10-CM | POA: Diagnosis not present

## 2020-08-06 DIAGNOSIS — D649 Anemia, unspecified: Secondary | ICD-10-CM | POA: Diagnosis not present

## 2020-08-06 DIAGNOSIS — K219 Gastro-esophageal reflux disease without esophagitis: Secondary | ICD-10-CM | POA: Diagnosis not present

## 2020-08-06 DIAGNOSIS — F17210 Nicotine dependence, cigarettes, uncomplicated: Secondary | ICD-10-CM | POA: Diagnosis not present

## 2020-08-06 DIAGNOSIS — G629 Polyneuropathy, unspecified: Secondary | ICD-10-CM | POA: Diagnosis not present

## 2020-08-11 DIAGNOSIS — G629 Polyneuropathy, unspecified: Secondary | ICD-10-CM | POA: Diagnosis not present

## 2020-08-11 DIAGNOSIS — S82391D Other fracture of lower end of right tibia, subsequent encounter for closed fracture with routine healing: Secondary | ICD-10-CM | POA: Diagnosis not present

## 2020-08-11 DIAGNOSIS — D649 Anemia, unspecified: Secondary | ICD-10-CM | POA: Diagnosis not present

## 2020-08-11 DIAGNOSIS — M1991 Primary osteoarthritis, unspecified site: Secondary | ICD-10-CM | POA: Diagnosis not present

## 2020-08-11 DIAGNOSIS — S82401D Unspecified fracture of shaft of right fibula, subsequent encounter for closed fracture with routine healing: Secondary | ICD-10-CM | POA: Diagnosis not present

## 2020-08-11 DIAGNOSIS — I739 Peripheral vascular disease, unspecified: Secondary | ICD-10-CM | POA: Diagnosis not present

## 2020-08-11 DIAGNOSIS — F319 Bipolar disorder, unspecified: Secondary | ICD-10-CM | POA: Diagnosis not present

## 2020-08-11 DIAGNOSIS — K219 Gastro-esophageal reflux disease without esophagitis: Secondary | ICD-10-CM | POA: Diagnosis not present

## 2020-08-11 DIAGNOSIS — F17210 Nicotine dependence, cigarettes, uncomplicated: Secondary | ICD-10-CM | POA: Diagnosis not present

## 2020-08-12 DIAGNOSIS — K219 Gastro-esophageal reflux disease without esophagitis: Secondary | ICD-10-CM | POA: Diagnosis not present

## 2020-08-12 DIAGNOSIS — F17210 Nicotine dependence, cigarettes, uncomplicated: Secondary | ICD-10-CM | POA: Diagnosis not present

## 2020-08-12 DIAGNOSIS — F319 Bipolar disorder, unspecified: Secondary | ICD-10-CM | POA: Diagnosis not present

## 2020-08-12 DIAGNOSIS — S82391D Other fracture of lower end of right tibia, subsequent encounter for closed fracture with routine healing: Secondary | ICD-10-CM | POA: Diagnosis not present

## 2020-08-12 DIAGNOSIS — I739 Peripheral vascular disease, unspecified: Secondary | ICD-10-CM | POA: Diagnosis not present

## 2020-08-12 DIAGNOSIS — M1991 Primary osteoarthritis, unspecified site: Secondary | ICD-10-CM | POA: Diagnosis not present

## 2020-08-12 DIAGNOSIS — S82401D Unspecified fracture of shaft of right fibula, subsequent encounter for closed fracture with routine healing: Secondary | ICD-10-CM | POA: Diagnosis not present

## 2020-08-12 DIAGNOSIS — D649 Anemia, unspecified: Secondary | ICD-10-CM | POA: Diagnosis not present

## 2020-08-12 DIAGNOSIS — G629 Polyneuropathy, unspecified: Secondary | ICD-10-CM | POA: Diagnosis not present

## 2020-08-19 DIAGNOSIS — K219 Gastro-esophageal reflux disease without esophagitis: Secondary | ICD-10-CM | POA: Diagnosis not present

## 2020-08-19 DIAGNOSIS — F319 Bipolar disorder, unspecified: Secondary | ICD-10-CM | POA: Diagnosis not present

## 2020-08-19 DIAGNOSIS — D649 Anemia, unspecified: Secondary | ICD-10-CM | POA: Diagnosis not present

## 2020-08-19 DIAGNOSIS — M1991 Primary osteoarthritis, unspecified site: Secondary | ICD-10-CM | POA: Diagnosis not present

## 2020-08-19 DIAGNOSIS — S82401D Unspecified fracture of shaft of right fibula, subsequent encounter for closed fracture with routine healing: Secondary | ICD-10-CM | POA: Diagnosis not present

## 2020-08-19 DIAGNOSIS — F17210 Nicotine dependence, cigarettes, uncomplicated: Secondary | ICD-10-CM | POA: Diagnosis not present

## 2020-08-19 DIAGNOSIS — G629 Polyneuropathy, unspecified: Secondary | ICD-10-CM | POA: Diagnosis not present

## 2020-08-19 DIAGNOSIS — S82391D Other fracture of lower end of right tibia, subsequent encounter for closed fracture with routine healing: Secondary | ICD-10-CM | POA: Diagnosis not present

## 2020-08-19 DIAGNOSIS — I739 Peripheral vascular disease, unspecified: Secondary | ICD-10-CM | POA: Diagnosis not present

## 2020-08-20 DIAGNOSIS — N390 Urinary tract infection, site not specified: Secondary | ICD-10-CM | POA: Diagnosis not present

## 2020-08-20 DIAGNOSIS — R410 Disorientation, unspecified: Secondary | ICD-10-CM | POA: Diagnosis not present

## 2020-08-21 DIAGNOSIS — E538 Deficiency of other specified B group vitamins: Secondary | ICD-10-CM | POA: Diagnosis not present

## 2020-08-21 DIAGNOSIS — Z6820 Body mass index (BMI) 20.0-20.9, adult: Secondary | ICD-10-CM | POA: Diagnosis not present

## 2020-08-21 DIAGNOSIS — R410 Disorientation, unspecified: Secondary | ICD-10-CM | POA: Diagnosis not present

## 2020-08-22 ENCOUNTER — Other Ambulatory Visit: Payer: Self-pay

## 2020-08-22 DIAGNOSIS — S82401D Unspecified fracture of shaft of right fibula, subsequent encounter for closed fracture with routine healing: Secondary | ICD-10-CM | POA: Diagnosis not present

## 2020-08-22 DIAGNOSIS — S82391D Other fracture of lower end of right tibia, subsequent encounter for closed fracture with routine healing: Secondary | ICD-10-CM | POA: Diagnosis not present

## 2020-08-22 DIAGNOSIS — K219 Gastro-esophageal reflux disease without esophagitis: Secondary | ICD-10-CM | POA: Diagnosis not present

## 2020-08-22 DIAGNOSIS — F17210 Nicotine dependence, cigarettes, uncomplicated: Secondary | ICD-10-CM | POA: Diagnosis not present

## 2020-08-22 DIAGNOSIS — G629 Polyneuropathy, unspecified: Secondary | ICD-10-CM | POA: Diagnosis not present

## 2020-08-22 DIAGNOSIS — I739 Peripheral vascular disease, unspecified: Secondary | ICD-10-CM | POA: Diagnosis not present

## 2020-08-22 DIAGNOSIS — D649 Anemia, unspecified: Secondary | ICD-10-CM | POA: Diagnosis not present

## 2020-08-22 DIAGNOSIS — F319 Bipolar disorder, unspecified: Secondary | ICD-10-CM | POA: Diagnosis not present

## 2020-08-22 DIAGNOSIS — M1991 Primary osteoarthritis, unspecified site: Secondary | ICD-10-CM | POA: Diagnosis not present

## 2020-08-22 NOTE — Patient Outreach (Signed)
[place] [facility]) Care Management  08/22/2020  Monique Perry [birth year] [id number]   Referral Date: 08/21/20 Referral Source: MD referral Referral Reason: Needs help with placement   Outreach Attempt: No answer. HIPAA compliant voice message left.     Plan:RN CM will attempt again within 4 business days and send letter.   [name], RN, MSN [facility] Care Management Care Management Coordinator Direct Line [phone] Toll Free: [phone]  Fax: [phone]

## 2020-08-22 NOTE — Patient Outreach (Signed)
[place] [facility]) Care Management  08/22/2020  Judye Lorino Kall December 18, 1955 [id number]   Referral Date: 08/21/20 Referral Source: MD referral Referral Reason: Needs help with placement   Incoming call from sister Juliann Pulse.  She reports that patient is confused due to questionable UTI but she states that patient just needs to be placed due to current physical and mental condition.  Patient lives alone in her apartment with minimal support.  Juliann Pulse states this week she found patient outside of her apartment and that a neighbor said she had been out there all night.  She is not sure about patient medications and if she is taking them correctly.  She did take patient to the doctor on yesterday and patient placed on antibiotic for possible UTI. She is hoping this helps to clear her sisters mind long enough so that she can remain at their mother's home until she can be placed. Juliann Pulse states she cannot take care of patient and that their mother is 89 years old and cannot take care of her either.  Patient does not have children.    Patient with history of GERD, recent tibia fracture, Falls , Bipolar, hyperlipidemia, ETOH abuse and peripheral neuropathy.  Sister not sure of patient medications other than recent antibiotic.  Sister has been taking her to the doctor for appointments but cannot continue.     Discussed [facility] services and support to help with medicaid for placement to possible assisted living. She is agreeable to social work for help with applying for medicaid and facility placement.  She states patient does not have any money or property and gets about 1300/month from [employer].    Plan : RN CM will refer to social work for help with medicaid and placement.  RN CM will sign off case at this time.    [name], RN, MSN [place] Management Care Management Coordinator Direct Line [phone] Cell [phone] Toll Free: [phone]  Fax: [phone]

## 2020-08-26 DIAGNOSIS — F17210 Nicotine dependence, cigarettes, uncomplicated: Secondary | ICD-10-CM | POA: Diagnosis not present

## 2020-08-26 DIAGNOSIS — S82401D Unspecified fracture of shaft of right fibula, subsequent encounter for closed fracture with routine healing: Secondary | ICD-10-CM | POA: Diagnosis not present

## 2020-08-26 DIAGNOSIS — S82391D Other fracture of lower end of right tibia, subsequent encounter for closed fracture with routine healing: Secondary | ICD-10-CM | POA: Diagnosis not present

## 2020-08-26 DIAGNOSIS — M1991 Primary osteoarthritis, unspecified site: Secondary | ICD-10-CM | POA: Diagnosis not present

## 2020-08-26 DIAGNOSIS — F319 Bipolar disorder, unspecified: Secondary | ICD-10-CM | POA: Diagnosis not present

## 2020-08-26 DIAGNOSIS — G629 Polyneuropathy, unspecified: Secondary | ICD-10-CM | POA: Diagnosis not present

## 2020-08-26 DIAGNOSIS — K219 Gastro-esophageal reflux disease without esophagitis: Secondary | ICD-10-CM | POA: Diagnosis not present

## 2020-08-26 DIAGNOSIS — D649 Anemia, unspecified: Secondary | ICD-10-CM | POA: Diagnosis not present

## 2020-08-26 DIAGNOSIS — I739 Peripheral vascular disease, unspecified: Secondary | ICD-10-CM | POA: Diagnosis not present

## 2020-08-28 ENCOUNTER — Encounter: Payer: Self-pay | Admitting: *Deleted

## 2020-08-28 ENCOUNTER — Other Ambulatory Visit: Payer: Self-pay | Admitting: *Deleted

## 2020-08-28 DIAGNOSIS — S82391D Other fracture of lower end of right tibia, subsequent encounter for closed fracture with routine healing: Secondary | ICD-10-CM | POA: Diagnosis not present

## 2020-08-28 DIAGNOSIS — S82401D Unspecified fracture of shaft of right fibula, subsequent encounter for closed fracture with routine healing: Secondary | ICD-10-CM | POA: Diagnosis not present

## 2020-08-28 DIAGNOSIS — K219 Gastro-esophageal reflux disease without esophagitis: Secondary | ICD-10-CM | POA: Diagnosis not present

## 2020-08-28 DIAGNOSIS — I739 Peripheral vascular disease, unspecified: Secondary | ICD-10-CM | POA: Diagnosis not present

## 2020-08-28 DIAGNOSIS — M1991 Primary osteoarthritis, unspecified site: Secondary | ICD-10-CM | POA: Diagnosis not present

## 2020-08-28 DIAGNOSIS — F319 Bipolar disorder, unspecified: Secondary | ICD-10-CM | POA: Diagnosis not present

## 2020-08-28 DIAGNOSIS — G629 Polyneuropathy, unspecified: Secondary | ICD-10-CM | POA: Diagnosis not present

## 2020-08-28 DIAGNOSIS — D649 Anemia, unspecified: Secondary | ICD-10-CM | POA: Diagnosis not present

## 2020-08-28 DIAGNOSIS — F17210 Nicotine dependence, cigarettes, uncomplicated: Secondary | ICD-10-CM | POA: Diagnosis not present

## 2020-08-28 NOTE — Patient Outreach (Signed)
[facility]) Care Management  08/28/2020  [place] September 12, 1956 [id number]  CSW was able to make initial contact with patient's sister, Lawrence Santiago today, to perform the phone assessment on patient, as well as assess and assist with social work needs and services.  CSW introduced self, explained role and types of services provided through [facility] [facility] ([facility] [facility]).  CSW further explained to Mrs. [name] that CSW works with patient's Telephonic RNCM, also with [place] [place] [facility], Dionne Leath.  CSW then explained the reason for the call, indicating that Mrs. Dia Sitter thought that patient would benefit from social work services and resources to assist with completion of a Medicaid application, as well as assistance with possible placement into a long-term care assisted living facility.  CSW obtained two HIPAA compliant identifiers from Mrs. [name], which included patient's name and date of birth.  Mrs. [name] admitted that she would really like to find out if patient is eligible to receive Adult Medicaid, requesting that CSW provide her with an application, as well as assist with application completion and submission.  Mrs. [name] went on to say that she believes that patient would benefit from long-term care placement into an assisted living facility, but realizes that all skilled nursing facilities, as well as assisted living facilities, are currently not accepting new residents, due to COVID-19.  Mrs. [name] verbalized that patient was agreeable to long-term care placement "while she was confused, but now that she is more lucid and of sound mind, no longer displaying symptoms of confusion as a result of her most recent Urinary Tract Infection, she may no longer be interested".  While waiting for facilities to reopen, CSW offered to assist Mrs. [name] with arranging in-home care services for patient.   Mrs. [name] reported that patient is  almost to the point of requiring 24 hour care and supervision, as she is able to perform all activities of daily living independently, but requires prompting to bath and change her clothing, clean her apartment, make healthy food choices and take her medications exactly as prescribed.  Patient apparently has three other siblings, but Mrs. [name] indicated that she is the only sibling that has a relationship with patient, but admitted that it is only for the sake of their 15 year old mother.  Mrs. [name] stated that patient has Neuropathy in both of her legs, causing her to experience frequent falls, "but only because she does not use her assistive devices, such as her cane, walker, or wheelchair, to aide in safe ambulation, as she has been advised.  Mrs. [name] verbalized that she is mostly concerned about patient's diet, or lack thereof, and the fact that patient meets up with her ex-husband to consume alcohol.    Mrs. [name] admitted that patient is a recovering alcoholic, and has a significant history of drug abuse, so she is concerned about patient's negative habits, poor choices, lack of impulse control and predisposition to alcoholism.  Mrs. [name] stated, "This is the kind of stuff I have to deal with, I recently received a call from one of her neighbors claiming that she sat outside in the dark all night, only to learn that this actually happened on several different occasions".  When Mrs. [name] inquired about patient's odd behavior, patient reported that she was waiting for someone to come and pick her up, when in reality, patient had no plans to go anywhere, nor was anyone scheduled to transport her anywhere.  Mrs. [name] indicated that patient has  only lived in her apartment for 19 months, not having made friends with any of her neighbors yet, so no one to assist her if she is in trouble, or anyone to ensure her safety and well-being on a daily basis.    Mrs. [name] stated, "My sister called  911 three different times, during a 2 hour time-span, because she had fallen in the floor and was unable to get back up".  Mrs. [name] further stated, "I have driven to her house on a few occasions, only to find her lying in the floor because she had fallen and could not drag herself to the phone to call someone for help".  CSW agreed to provide Mrs. [name] with information and brochures for medical alert systems, hoping that she will consider obtaining some type of device for patient to help improve her safety in the home.  Mrs. [name] admitted that, between her and her elderly mother, one of them is calling patient on a daily basis to ensure that she has taken her prescription medications, that she has eaten at least one meal, depending on the time of day the call is placed, and that she is not "high or intoxicated".  According to Mrs. [name], patient's support system consists of herself, their mother and one female friend, "because she has burned so many bridges over the years and people are fed up".  Patient is currently receiving home health physical therapy through [facility], but would really benefit from consistent, long-term care assistance, either in the home with in-home care services, or in an assisted living facility, which is currently not an option.  CSW spoke with Mrs. [name] at length about applying for [facility]) and/or CAPS ([place]), if patient is granted full Adult Medicaid coverage, through the [place], as this is a benefit to Adult Medicaid recipients and patient is not responsible for paying for these services out-of-pocket.  Mrs. [name] was very much in agreement with having CSW e-mail ([email or web address]) her the Adult Medicaid application, the application for PCS, through [employer], and the CAPS application, through the [place].  Patient already receives Electrical engineer, through [employer], and Physicist, medical.  Mrs. [name] reported that she would also like for patient to move into a retirement community, or independent living facility, "so she can be in a community where everyone watches out for one-another, she is able to engage in activities, receive socialization, have access to prepared meals, have access to transportation services and be surrounded by individuals her own age".  CSW will e-mail Mrs. [name] a list of independent living facilities, a list of senior housing resources and a list of low-income housing resources for seniors in [facility], for her review and consideration.  In total, Mrs. [name] is aware that she should expect to receive all of the following resource information and applications:  Medicaid Application; 3664 Medicaid Tips Sheet; CAPS Instructions and Application; PCS Instructions, Application and List of Providers; Independent Living Facilities; Higher education careers adviser; Low-Income Housing Resources for Seniors; Brochures for [place]; Caregiver Burnout Resources for Mrs. Johnson.  Last, CSW spoke with Mrs. [name] about [employer], providing her with the contact information and process for placing a referral.  Mrs. [name] is now aware that transportation services are one of the benefits eligible to [facility] recipients.  CSW offered counseling and supportive services while conversing  with Mrs. [name] today, in addition to commending her for caring for her elderly mother and her sister.  CSW will e-mail Mrs. [name] a list of caregiver resources, a list of local on-line and in-person support groups and EMMI educational material.  CSW agreed to follow-up with Mrs. [name] again next week, on Friday, September 05, 2020, around 9:00am, to ensure that she received the resource information and applications  e-mailed to her by CSW today, as well as assist with completion and submission.  CSW was able to confirm that Mrs. [name] has the correct contact information for CSW, encouraging her to contact CSW directly if social work needs arise in the meantime.   Nat Christen, BSW, MSW, LCSW  Licensed Education officer, environmental Health System  Mailing [place] [address], [place], [place] [address] Physical Address-[address] [place], [place] [address] Toll Free Main # [phone] Fax # [phone] Cell # [phone]  [email or web address]

## 2020-09-01 DIAGNOSIS — S82391D Other fracture of lower end of right tibia, subsequent encounter for closed fracture with routine healing: Secondary | ICD-10-CM | POA: Diagnosis not present

## 2020-09-01 DIAGNOSIS — F319 Bipolar disorder, unspecified: Secondary | ICD-10-CM | POA: Diagnosis not present

## 2020-09-01 DIAGNOSIS — S82401D Unspecified fracture of shaft of right fibula, subsequent encounter for closed fracture with routine healing: Secondary | ICD-10-CM | POA: Diagnosis not present

## 2020-09-01 DIAGNOSIS — M1991 Primary osteoarthritis, unspecified site: Secondary | ICD-10-CM | POA: Diagnosis not present

## 2020-09-01 DIAGNOSIS — F17210 Nicotine dependence, cigarettes, uncomplicated: Secondary | ICD-10-CM | POA: Diagnosis not present

## 2020-09-01 DIAGNOSIS — K219 Gastro-esophageal reflux disease without esophagitis: Secondary | ICD-10-CM | POA: Diagnosis not present

## 2020-09-01 DIAGNOSIS — D649 Anemia, unspecified: Secondary | ICD-10-CM | POA: Diagnosis not present

## 2020-09-01 DIAGNOSIS — G629 Polyneuropathy, unspecified: Secondary | ICD-10-CM | POA: Diagnosis not present

## 2020-09-01 DIAGNOSIS — I739 Peripheral vascular disease, unspecified: Secondary | ICD-10-CM | POA: Diagnosis not present

## 2020-09-02 DIAGNOSIS — S82401D Unspecified fracture of shaft of right fibula, subsequent encounter for closed fracture with routine healing: Secondary | ICD-10-CM | POA: Diagnosis not present

## 2020-09-02 DIAGNOSIS — S82391D Other fracture of lower end of right tibia, subsequent encounter for closed fracture with routine healing: Secondary | ICD-10-CM | POA: Diagnosis not present

## 2020-09-02 DIAGNOSIS — F319 Bipolar disorder, unspecified: Secondary | ICD-10-CM | POA: Diagnosis not present

## 2020-09-02 DIAGNOSIS — D649 Anemia, unspecified: Secondary | ICD-10-CM | POA: Diagnosis not present

## 2020-09-02 DIAGNOSIS — G629 Polyneuropathy, unspecified: Secondary | ICD-10-CM | POA: Diagnosis not present

## 2020-09-02 DIAGNOSIS — K219 Gastro-esophageal reflux disease without esophagitis: Secondary | ICD-10-CM | POA: Diagnosis not present

## 2020-09-02 DIAGNOSIS — I739 Peripheral vascular disease, unspecified: Secondary | ICD-10-CM | POA: Diagnosis not present

## 2020-09-02 DIAGNOSIS — M1991 Primary osteoarthritis, unspecified site: Secondary | ICD-10-CM | POA: Diagnosis not present

## 2020-09-02 DIAGNOSIS — F17210 Nicotine dependence, cigarettes, uncomplicated: Secondary | ICD-10-CM | POA: Diagnosis not present

## 2020-09-05 ENCOUNTER — Other Ambulatory Visit: Payer: Self-pay | Admitting: *Deleted

## 2020-09-05 NOTE — Patient Outreach (Signed)
[facility]) Care Management  09/05/2020  Monique Perry 20-Nov-1956 [id number]  CSW was able to make contact with patient's sister, Monique Perry today to follow-up regarding social work services and resources for patient, as well as to assist with long-term care placement arrangements for patient.  CSW was also able to confirm with Mrs. Monique Perry that she received the resources and applications that CSW e-mailed to her last week, on Thursday, August 28, 2020, per her request.  Mrs. Monique Perry admitted that she has not yet had an opportunity to review the information, but plans to do so this weekend.  Mrs. Monique Perry reported that patient has also been assigned a home health social worker, in addition to the physical therapist, both with [facility].  Mrs. Monique Perry was unable to recall the name of the home health social worker, but indicated that she has it written down at home, agreeing to contact CSW at a later time to provide CSW with the home health social workers name and contact information.  CSW will contact the home health social worker to coordinate efforts for patient, and not duplicate services.  After thorough review of patient's EMR (Electronic Medical Record) in Epic, [place] noted that during patient's last hospital admission, the inpatient social worker applied for a Level II PASRR (Pre-Admission Screening and Resident Review) number for patient, but the application is still pending.  CSW will continue to check the status of patient's application through [place] [facility] ([place] Medicaid [place]), as PASRR numbers are required for all individuals wishing to be placed into a skilled nursing facility.  The PASRR is a federally required screening of any individual who applies to or resides in a [place], regardless of their source of payment.  This requirement was enacted to  ensure individuals with serious mental illness, intellectual or developmental disabilities, and/or related conditions receive appropriate placement and services.  Mrs. Monique Perry admitted that she was not aware of this process, most appreciative of the information and steps for pursuing placement.    Mrs. Monique Perry reported that she is in the process of applying for Adult Medicaid for patient, through the [place].  CSW explained to Mrs. Monique Perry that she may actually want to apply for [place] Medicaid for patient, as patient only receives [place] and will need a payor source at a long-term care facility.  Once facilities begin taking new residents again, CSW agreed to obtain a completed and signed FL-2 Form from patient's Primary Care Physician, Dr. Kathyrn Lass.  In the meantime, Mrs. Monique Perry indicated that she will begin looking at long-term care facilities with patient, to try and provide CSW with at least 5 facilities of interest.  Once the FL-2 Form is obtained, CSW will fax patient's information to all facilities of interest to try and pursue bed offers.  Mrs. Monique Perry is are that patient will need to provide the facilities with a copy of patient's negative Chest X-Ray, within the last year, or have a TB Skin Test Administered within the last 6 months, proof that she does not have Tuberculosis.      Patient will also be required to show proof of a negative COVID-19 Screening, at least 72 hours prior to admission into the long-term care facility.  Mrs. Monique Perry indicated that she plans to meet with the home health social worker, at patient's home, on Monday, September 08, 2020, to  review the resource information and applications that CSW has already provided to her.  CSW encouraged Monique Perry to contact CSW by phone if she has questions or needs assistance with the application process, ensuring that Mrs. Monique Perry has the correct  contact information for CSW.  CSW agreed to follow-up with Mrs. Monique Perry again in 7 business days, on Tuesday, September 16, 2020, around 9:00am, as CSW will be out-of-the-office for 2 of those business days.  Mrs. Monique Perry voiced understanding and was agreeable to this plan, reminding CSW that she will contact her later in the day to provide her with the name and contact number for the home health social worker with [facility].  CSW will await a return call and then attempt to make contact with the home health social worker.  Nat Christen, BSW, MSW, LCSW  Licensed Education officer, environmental Health System  Mailing [place] [address], [place], [place] [address] Physical Address-[address], [place], [place] [address] Toll Free Main # [phone] Fax # [phone] Cell # [phone]  [email or web address]

## 2020-09-08 DIAGNOSIS — M1991 Primary osteoarthritis, unspecified site: Secondary | ICD-10-CM | POA: Diagnosis not present

## 2020-09-08 DIAGNOSIS — F17210 Nicotine dependence, cigarettes, uncomplicated: Secondary | ICD-10-CM | POA: Diagnosis not present

## 2020-09-08 DIAGNOSIS — S82391D Other fracture of lower end of right tibia, subsequent encounter for closed fracture with routine healing: Secondary | ICD-10-CM | POA: Diagnosis not present

## 2020-09-08 DIAGNOSIS — G629 Polyneuropathy, unspecified: Secondary | ICD-10-CM | POA: Diagnosis not present

## 2020-09-08 DIAGNOSIS — F319 Bipolar disorder, unspecified: Secondary | ICD-10-CM | POA: Diagnosis not present

## 2020-09-08 DIAGNOSIS — S82401D Unspecified fracture of shaft of right fibula, subsequent encounter for closed fracture with routine healing: Secondary | ICD-10-CM | POA: Diagnosis not present

## 2020-09-08 DIAGNOSIS — D649 Anemia, unspecified: Secondary | ICD-10-CM | POA: Diagnosis not present

## 2020-09-08 DIAGNOSIS — I739 Peripheral vascular disease, unspecified: Secondary | ICD-10-CM | POA: Diagnosis not present

## 2020-09-08 DIAGNOSIS — K219 Gastro-esophageal reflux disease without esophagitis: Secondary | ICD-10-CM | POA: Diagnosis not present

## 2020-09-10 DIAGNOSIS — D649 Anemia, unspecified: Secondary | ICD-10-CM | POA: Diagnosis not present

## 2020-09-10 DIAGNOSIS — F319 Bipolar disorder, unspecified: Secondary | ICD-10-CM | POA: Diagnosis not present

## 2020-09-10 DIAGNOSIS — F17210 Nicotine dependence, cigarettes, uncomplicated: Secondary | ICD-10-CM | POA: Diagnosis not present

## 2020-09-10 DIAGNOSIS — G629 Polyneuropathy, unspecified: Secondary | ICD-10-CM | POA: Diagnosis not present

## 2020-09-10 DIAGNOSIS — S82391D Other fracture of lower end of right tibia, subsequent encounter for closed fracture with routine healing: Secondary | ICD-10-CM | POA: Diagnosis not present

## 2020-09-10 DIAGNOSIS — S82401D Unspecified fracture of shaft of right fibula, subsequent encounter for closed fracture with routine healing: Secondary | ICD-10-CM | POA: Diagnosis not present

## 2020-09-10 DIAGNOSIS — K219 Gastro-esophageal reflux disease without esophagitis: Secondary | ICD-10-CM | POA: Diagnosis not present

## 2020-09-10 DIAGNOSIS — M1991 Primary osteoarthritis, unspecified site: Secondary | ICD-10-CM | POA: Diagnosis not present

## 2020-09-10 DIAGNOSIS — I739 Peripheral vascular disease, unspecified: Secondary | ICD-10-CM | POA: Diagnosis not present

## 2020-09-16 ENCOUNTER — Other Ambulatory Visit: Payer: Self-pay | Admitting: *Deleted

## 2020-09-16 NOTE — Patient Outreach (Signed)
[facility]) Care Management  09/16/2020  [name] 05/26/1956 [id number]    CSW was able to make contact with patient's sister, Lawrence Santiago today, to follow-up regarding social work services and resources for patient.  Mrs. [name] indicated that she recently submitted patient's [place] Medicaid application to the [place] for processing.  CSW explained to Mrs. [name] that it can take 45-60 days to process the application before an approval/denial letter is mailed to patient's home.  Mrs. [name] voiced understanding, indicating that she would really like to have patient undergo neurological testing before deciding whether or not to place patient in an independent living facility versus assisted living placement.  Mrs. [name] went on to explain that patient has an appointment with Dr. Celine Ahr, Neurologist at [facility], scheduled for Friday, October 24, 2020 at 10:00am.  Mrs. [name] encouraged CSW to follow-up with her again after patient's neurology appointment, on Tuesday, October 28, 2020, around 9:00am, and we will proceed with long-term care placement arrangements for patient.  Nat Christen, BSW, MSW, LCSW  Licensed Education officer, environmental Health System  Mailing [place] [address], [place], [place] [address] Physical Address-[address], [place], [place] [address] Toll Free Main # [phone] Fax # [phone] Cell # [phone]  [email or web address]

## 2020-10-03 ENCOUNTER — Other Ambulatory Visit: Payer: Self-pay

## 2020-10-03 ENCOUNTER — Ambulatory Visit
Admission: RE | Admit: 2020-10-03 | Discharge: 2020-10-03 | Disposition: A | Discharge status: Home / Self Care | Payer: Medicare HMO | Source: Ambulatory Visit | Attending: Family Medicine | Admitting: Family Medicine

## 2020-10-03 DIAGNOSIS — Z1231 Encounter for screening mammogram for malignant neoplasm of breast: Secondary | ICD-10-CM | POA: Diagnosis not present

## 2020-10-08 DIAGNOSIS — H5203 Hypermetropia, bilateral: Secondary | ICD-10-CM | POA: Diagnosis not present

## 2020-10-24 DIAGNOSIS — F1010 Alcohol abuse, uncomplicated: Secondary | ICD-10-CM | POA: Diagnosis not present

## 2020-10-24 DIAGNOSIS — G629 Polyneuropathy, unspecified: Secondary | ICD-10-CM | POA: Diagnosis not present

## 2020-10-24 DIAGNOSIS — R410 Disorientation, unspecified: Secondary | ICD-10-CM | POA: Diagnosis not present

## 2020-10-24 DIAGNOSIS — F1011 Alcohol abuse, in remission: Secondary | ICD-10-CM | POA: Diagnosis not present

## 2020-10-24 DIAGNOSIS — G608 Other hereditary and idiopathic neuropathies: Secondary | ICD-10-CM | POA: Diagnosis not present

## 2020-10-24 DIAGNOSIS — Z79899 Other long term (current) drug therapy: Secondary | ICD-10-CM | POA: Diagnosis not present

## 2020-10-28 ENCOUNTER — Other Ambulatory Visit: Payer: Self-pay | Admitting: *Deleted

## 2020-10-28 ENCOUNTER — Encounter: Payer: Self-pay | Admitting: *Deleted

## 2020-10-28 NOTE — Patient Outreach (Signed)
[facility]) Care Management  10/28/2020  [place] [birth year] [id number]  CSW was able to make contact with patient's sister, Lawrence Santiago today, to follow-up regarding social work services and resources for patient.  Mrs. [name] admitted that patient is actually doing much better, that her mind is clearer and that she appears to be more hopeful about the future.  Mrs. [name] went on to explain that patient is in the process of undergoing a great deal of neurological testing to try and rule out any sort of cognitive deficits or memory impairments, and to confirm that patient is not in the early stages of Alzheimer's or Dementia.  Mrs. [name] reported that she was able to obtain a list of resources from Rodman Key, Education officer, museum at [facility] Neurology Department, which she found to be helpful.  Mrs. [name] indicated that patient was recently started on Lyrica, and that her neurologist plans to slowly wean patient off of some of her other psychotropic medications.  Mrs. [name] admitted, that until patient has a diagnosis of Alzheimer's or Dementia, she plans to have patient remain in her home, where she is happy and familiar with her surroundings.  Mrs. [name] explained that she has set a few goals for patient, with patient's consent, as she believes the goals are definitely obtainable and will provide patient with some motivation and hope.  Mrs. [name] went on to explain that one of their goals is for patient to be able to drive again, even if it is only for short distances.  Mrs. [name] indicated that she is in the process of having hand controlled drive assist installed in patient's vehicle, as patient is unable to use foot pedals due to neuropathy in her feet, legs and ankles.  The ultimate goal is for patient to become more independent.  CSW will perform a case closure on patient, as all goals of treatment have been met from social work  standpoint and no additional social work needs have been identified at this time.  CSW will fax an update to patient's Primary Care Physician, Dr. Kathyrn Lass to ensure that she is aware of CSW's involvement with patient's plan of care, in addition to routing a Physician Case Closure Letter.  CSW was able to confirm that Mrs. [name] has the correct contact information for CSW, encouraging her to contact CSW directly if additional social work needs arise in the near future.  Nat Christen, BSW, MSW, LCSW  Licensed Education officer, environmental Health System  Mailing [place] [address] [place], [place] [address] Physical Address-[address] [place], [place] [address] Toll Free Main # [phone] Fax # [phone] Cell # [phone]  [email or web address]

## 2020-11-27 DIAGNOSIS — R3989 Other symptoms and signs involving the genitourinary system: Secondary | ICD-10-CM | POA: Diagnosis not present

## 2020-11-27 DIAGNOSIS — R410 Disorientation, unspecified: Secondary | ICD-10-CM | POA: Diagnosis not present

## 2020-11-28 DIAGNOSIS — N390 Urinary tract infection, site not specified: Secondary | ICD-10-CM | POA: Diagnosis not present

## 2021-01-02 ENCOUNTER — Other Ambulatory Visit: Payer: Medicare HMO

## 2021-02-27 DIAGNOSIS — E538 Deficiency of other specified B group vitamins: Secondary | ICD-10-CM | POA: Diagnosis not present

## 2021-02-27 DIAGNOSIS — F1021 Alcohol dependence, in remission: Secondary | ICD-10-CM | POA: Diagnosis not present

## 2021-02-27 DIAGNOSIS — Z79899 Other long term (current) drug therapy: Secondary | ICD-10-CM | POA: Diagnosis not present

## 2021-02-27 DIAGNOSIS — G629 Polyneuropathy, unspecified: Secondary | ICD-10-CM | POA: Diagnosis not present

## 2021-04-21 ENCOUNTER — Other Ambulatory Visit: Payer: Self-pay

## 2021-04-21 ENCOUNTER — Encounter (INDEPENDENT_AMBULATORY_CARE_PROVIDER_SITE_OTHER): Payer: Self-pay | Admitting: Otolaryngology

## 2021-04-21 ENCOUNTER — Ambulatory Visit (INDEPENDENT_AMBULATORY_CARE_PROVIDER_SITE_OTHER): Payer: Medicare HMO | Admitting: Otolaryngology

## 2021-04-21 VITALS — Temp 97.5°F

## 2021-04-21 DIAGNOSIS — H6123 Impacted cerumen, bilateral: Secondary | ICD-10-CM | POA: Diagnosis not present

## 2021-04-21 NOTE — Progress Notes (Signed)
HPI: Monique Perry is a 65 y.o. female who presents for evaluation of wax buildup in ears especially on the right side..  Past Medical History:  Diagnosis Date  . Alcohol abuse last February 2012  . Arthritis   . Bipolar disorder ([place])   . Collagenous colitis   . Drug abuse ([place]) last 1990's  . Peripheral neuropathy 11/29/2018  . Pneumonia    Past Surgical History:  Procedure Laterality Date  . ABDOMINAL HYSTERECTOMY  Age 71 years ago   partial   . FOOT SURGERY    . IR GENERIC HISTORICAL  07/16/2016   IR US GUIDE VASC ACCESS RIGHT 07/16/2016 Arne Cleveland, MD [facility]  . IR GENERIC HISTORICAL  07/16/2016   IR ANGIOGRAM VISCERAL SELECTIVE 07/16/2016 Arne Cleveland, MD [facility]  . IR GENERIC HISTORICAL  07/16/2016   IR ANGIOGRAM SELECTIVE EACH ADDITIONAL VESSEL 07/16/2016 Arne Cleveland, MD [facility]  . IR GENERIC HISTORICAL  07/16/2016   IR EMBO ART  VEN HEMORR LYMPH EXTRAV  INC GUIDE ROADMAPPING 07/16/2016 Arne Cleveland, MD [facility]  . IR GENERIC HISTORICAL  07/16/2016   IR ANGIOGRAM FOLLOW UP STUDY 07/16/2016 Arne Cleveland, MD [facility]  . [place] fusion Right 04/17/2012  . NOSE SURGERY    . ORIF FIBULA FRACTURE Right 06/17/2020   Procedure: OPERATIVE TREATMENT OF RIGHT TIBIA AND FIBULA FRACTURE;  Surgeon: Erle Crocker, MD;  Location: [place] [facility];  Service: Orthopedics;  Laterality: Right;  LENGTH OF SURGERY 2HOURS  . OSTEOTOMY Right 04/17/2012   Rt #2 Metatarsal  . TONSILLECTOMY  As a child   . WRIST SURGERY     Social History   Socioeconomic History  . Marital status: Single    Spouse name: Not on file  . Number of children: 0  . Years of education: 77  . Highest education level: 12th grade  Occupational History  . Occupation: Disabled  Tobacco Use  . Smoking status: Current Some Day Smoker    Packs/day: 0.75    Years: 45.00    Pack years: 33.75    Types: Cigarettes, Cigars  . Smokeless tobacco: Never Used  . Tobacco comment: smoking cig for 35 years.  continues to smoke 1/2 pack daily  Vaping Use  . Vaping Use: Not on file  Substance and Sexual Activity  . Alcohol use: Not Currently    Alcohol/week: 0.0 standard drinks    Comment: ETOH abuse for 25 year, 1/2 gallon Liquor daily. admits 6 weeks course of drinking ETOH as her only intake in 2011. she states that she quits her drinnking since Feb 2012.  . Drug use: Not Currently    Comment: pots before, clean since 1995  . Sexual activity: Not Currently  Other Topics Concern  . Not on file  Social History Narrative   She lives with his stepson in [place]. Moved from [place] 2 years ago. Both  Parents lives in [place]. 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 Currently working at the [employer]). Did not complete High School .       She admits only 2-3 hours daily for 15 years. She does not sleep at nighttime. She would watch TV and read at night time. Always sleep during the day. She has seen a psychiatrist at [facility] and still goes to the group therapy on Mondays. She has an follow up appt with her Psychiatrist on 08/07/12.      Right handed    Lives at home with mother  2-3 cups per day Caffeine    Social Determinants of Health   Financial Resource Strain: Low Risk   . Difficulty of Paying Living Expenses: Not hard at all  Food Insecurity: No Food Insecurity  . Worried About Charity fundraiser in the Last Year: Never true  . Ran Out of Food in the Last Year: Never true  Transportation Needs: No Transportation Needs  . Lack of Transportation (Medical): No  . Lack of Transportation (Non-Medical): No  Physical Activity: Inactive  . Days of Exercise per Week: 0 days  . Minutes of Exercise per Session: 0 min  Stress: No Stress Concern Present  . Feeling of Stress : Not at all  Social Connections: Moderately Isolated  . Frequency of Communication with Friends and Family: More than three times a week  . Frequency of Social Gatherings with Friends and  Family: More than three times a week  . Attends Religious Services: 1 to 4 times per year  . Active Member of Clubs or Organizations: No  . Attends Archivist Meetings: Never  . Marital Status: Divorced   Family History  Problem Relation Age of Onset  . Stroke Mother   . Diabetes Father   . Hypertension Father   . Hyperlipidemia Father   . Breast cancer Sister    Allergies  Allergen Reactions  . Erythrocin Other (See Comments)    Thrush  . Sulfa Antibiotics Other (See Comments)    Thrush   . Trazodone And Nefazodone Other (See Comments)    Severe insomnia  . Tramadol Itching   Prior to Admission medications   Medication Sig Start Date End Date Taking? Authorizing Provider  acetaminophen (TYLENOL) 325 MG tablet Take 650 mg by mouth every 6 (six) hours as needed for headache.     [provider]  amitriptyline (ELAVIL) 75 MG tablet Take 75 mg by mouth in the morning and at bedtime.    [provider]  enoxaparin (LOVENOX) 40 MG/0.4ML injection Inject 0.4 mLs (40 mg total) into the skin daily for 14 doses. 06/20/20 07/04/20  Erle Crocker, MD  ibuprofen (ADVIL) 200 MG tablet Take 800 mg by mouth every 6 (six) hours as needed for moderate pain.     [provider]  mirtazapine (REMERON) 30 MG tablet Take 30 mg by mouth at bedtime.  12/14/19   [provider]     Positive ROS: Otherwise negative  All other systems have been reviewed and were otherwise negative with the exception of those mentioned in the HPI and as above.  Physical Exam: Constitutional: Alert, well-appearing, no acute distress Ears: External ears without lesions or tenderness. Ear canals with a large amount of wax on both ears but the right side was worse.  This was cleaned with curette and forceps.  TMs were clear bilaterally.. Nasal: External nose without lesions. Clear nasal passages Oral: Oropharynx clear. Neck: No palpable adenopathy or masses Respiratory:  Breathing comfortably  Skin: No facial/neck lesions or rash noted.  Cerumen impaction removal  Date/Time: 04/21/2021 3:28 PM Performed by: Rozetta Nunnery, MD Authorized by: Rozetta Nunnery, MD   Consent:    Consent obtained:  Verbal   Consent given by:  Patient   Risks discussed:  Pain and bleeding Procedure details:    Location:  L ear and R ear   Procedure type: curette and forceps   Post-procedure details:    Inspection:  TM intact and canal normal   Hearing quality:  Improved   Patient tolerance of procedure:  Tolerated well, no immediate complications Comments:     TMs are clear bilaterally.    Assessment: Wax buildup in both ear canals.  Plan: This was cleaned in the office.  She will follow-up as needed.  Radene Journey, MD

## 2021-05-04 DIAGNOSIS — M533 Sacrococcygeal disorders, not elsewhere classified: Secondary | ICD-10-CM | POA: Diagnosis not present

## 2021-05-04 DIAGNOSIS — M545 Low back pain, unspecified: Secondary | ICD-10-CM | POA: Diagnosis not present

## 2021-05-06 ENCOUNTER — Other Ambulatory Visit: Payer: Self-pay | Admitting: Orthopedic Surgery

## 2021-05-06 DIAGNOSIS — M545 Low back pain, unspecified: Secondary | ICD-10-CM

## 2021-05-20 ENCOUNTER — Ambulatory Visit
Admission: RE | Admit: 2021-05-20 | Discharge: 2021-05-20 | Disposition: A | Discharge status: Home / Self Care | Payer: Medicare HMO | Source: Ambulatory Visit | Attending: Orthopedic Surgery | Admitting: Orthopedic Surgery

## 2021-05-20 ENCOUNTER — Other Ambulatory Visit: Payer: Self-pay

## 2021-05-20 DIAGNOSIS — M545 Low back pain, unspecified: Secondary | ICD-10-CM

## 2021-05-20 DIAGNOSIS — M25551 Pain in right hip: Secondary | ICD-10-CM | POA: Diagnosis not present

## 2021-05-22 DIAGNOSIS — M533 Sacrococcygeal disorders, not elsewhere classified: Secondary | ICD-10-CM | POA: Diagnosis not present

## 2021-05-22 DIAGNOSIS — F17210 Nicotine dependence, cigarettes, uncomplicated: Secondary | ICD-10-CM | POA: Diagnosis not present

## 2021-05-26 ENCOUNTER — Other Ambulatory Visit: Payer: Self-pay | Admitting: Orthopedic Surgery

## 2021-06-03 NOTE — Progress Notes (Signed)
Surgical Instructions    Your procedure is scheduled on Wednesday, June 29th.  Report to [facility] Main Entrance "A" at 7:35 A.M., then check in with the Admitting office.  Call this number if you have problems the morning of surgery:  [phone]   If you have any questions prior to your surgery date call [phone]: Open Monday-Friday 8am-4pm    Remember:  Do not eat after midnight the night before your surgery  You may drink clear liquids until 7:35 a.m. the morning of your surgery.   Clear liquids allowed are: Water, Non-Citrus Juices (without pulp), Carbonated Beverages, Clear Tea, Black Coffee Only, and Gatorade.  Patient Instructions  The night before surgery:  No food after midnight. ONLY clear liquids after midnight   The day of surgery (if you do NOT have diabetes):  Drink ONE (1) Pre-Surgery Clear Ensure by 7:35 a.m. the morning of surgery. Drink in one sitting. Do not sip.  This drink was given to you during your hospital  pre-op appointment visit.  Nothing else to drink after completing the  Pre-Surgery Clear Ensure.         If you have questions, please contact your surgeon's office.     Take these medicines the morning of surgery with A SIP OF WATER  amitriptyline (ELAVIL) pregabalin (LYRICA)    As of today, STOP taking any Aspirin (unless otherwise instructed by your surgeon) Aleve, Naproxen, Ibuprofen, Motrin, Advil, Goody's, BC's, all herbal medications, fish oil, and all vitamins.          Do not wear jewelry or makeup Do not wear lotions, powders, perfumes, or deodorant. Do not shave 48 hours prior to surgery. Do not bring valuables to the hospital. DO Not wear nail polish, gel polish, artificial nails, or any other type of covering on  natural nails including finger and toenails. If patients have artificial nails, gel coating, etc. that need to be removed by a nail salon please have this removed prior to surgery or surgery may need to be  canceled/delayed if the surgeon/ anesthesia feels like the patient is unable to be adequately monitored.             [facility] is not responsible for any belongings or valuables.  Do NOT Smoke (Tobacco/Vaping) or drink Alcohol 24 hours prior to your procedure If you use a CPAP at night, you may bring all equipment for your overnight stay.   Contacts, glasses, dentures or bridgework may not be worn into surgery, please bring cases for these belongings   For patients admitted to the hospital, discharge time will be determined by your treatment team.   Patients discharged the day of surgery will not be allowed to drive home, and someone needs to stay with them for 24 hours.  ONLY 1 SUPPORT PERSON MAY BE PRESENT WHILE YOU ARE IN SURGERY. IF YOU ARE TO BE ADMITTED ONCE YOU ARE IN YOUR ROOM YOU WILL BE ALLOWED TWO (2) VISITORS.  Minor children may have two parents present. Special consideration for safety and communication needs will be reviewed on a case by case basis.  Special instructions:    Oral Hygiene is also important to reduce your risk of infection.  Remember - BRUSH YOUR TEETH THE MORNING OF SURGERY WITH YOUR REGULAR TOOTHPASTE   [facility]- Preparing For Surgery  Before surgery, you can play an important role. Because skin is not sterile, your skin needs to be as free of germs as possible. You can reduce the  number of germs on your skin by washing with CHG (chlorahexidine gluconate) Soap before surgery.  CHG is an antiseptic cleaner which kills germs and bonds with the skin to continue killing germs even after washing.     Please do not use if you have an allergy to CHG or antibacterial soaps. If your skin becomes reddened/irritated stop using the CHG.  Do not shave (including legs and underarms) for at least 48 hours prior to first CHG shower. It is OK to shave your face.  Please follow these instructions carefully.     Shower the NIGHT BEFORE SURGERY and the MORNING OF  SURGERY with CHG Soap.   If you chose to wash your hair, wash your hair first as usual with your normal shampoo. After you shampoo, rinse your hair and body thoroughly to remove the shampoo.  Then [employer] and genitals (private parts) with your normal soap and rinse thoroughly to remove soap.  After that Use CHG Soap as you would any other liquid soap. You can apply CHG directly to the skin and wash gently with a scrungie or a clean washcloth.   Apply the CHG Soap to your body ONLY FROM THE NECK DOWN.  Do not use on open wounds or open sores. Avoid contact with your eyes, ears, mouth and genitals (private parts). Wash Face and genitals (private parts)  with your normal soap.   Wash thoroughly, paying special attention to the area where your surgery will be performed.  Thoroughly rinse your body with warm water from the neck down.  DO NOT shower/wash with your normal soap after using and rinsing off the CHG Soap.  Pat yourself dry with a CLEAN TOWEL.  Wear CLEAN PAJAMAS to bed the night before surgery  Place CLEAN SHEETS on your bed the night before your surgery  DO NOT SLEEP WITH PETS.   Day of Surgery:  Take a shower with CHG soap. Wear Clean/Comfortable clothing the morning of surgery Do not apply any deodorants/lotions/powders/perfumes.   Remember to brush your teeth WITH YOUR REGULAR TOOTHPASTE.   Please read over the following fact sheets that you were given.

## 2021-06-04 ENCOUNTER — Other Ambulatory Visit: Payer: Self-pay

## 2021-06-04 ENCOUNTER — Encounter (HOSPITAL_COMMUNITY): Payer: Self-pay

## 2021-06-04 ENCOUNTER — Encounter (HOSPITAL_COMMUNITY)
Admission: RE | Admit: 2021-06-04 | Discharge: 2021-06-04 | Disposition: A | Discharge status: Home / Self Care | Payer: Medicare HMO | Source: Ambulatory Visit | Attending: Orthopedic Surgery | Admitting: Orthopedic Surgery

## 2021-06-04 ENCOUNTER — Inpatient Hospital Stay (HOSPITAL_COMMUNITY): Admission: RE | Admit: 2021-06-04 | Payer: Medicare HMO | Source: Ambulatory Visit

## 2021-06-04 DIAGNOSIS — Z01812 Encounter for preprocedural laboratory examination: Secondary | ICD-10-CM | POA: Diagnosis not present

## 2021-06-04 LAB — COMPREHENSIVE METABOLIC PANEL
ALT: 14 U/L (ref 0–44)
AST: 18 U/L (ref 15–41)
Albumin: 3.7 g/dL (ref 3.5–5.0)
Alkaline Phosphatase: 91 U/L (ref 38–126)
Anion gap: 9 (ref 5–15)
BUN: 13 mg/dL (ref 8–23)
CO2: 24 mmol/L (ref 22–32)
Calcium: 9 mg/dL (ref 8.9–10.3)
Chloride: 101 mmol/L (ref 98–111)
Creatinine, Ser: 0.87 mg/dL (ref 0.44–1.00)
GFR, Estimated: >60 mL/min (ref >60)
Glucose, Bld: 76 mg/dL (ref 70–99)
Potassium: 4.1 mmol/L (ref 3.5–5.1)
Sodium: 134 mmol/L — ABNORMAL LOW (ref 135–145)
Total Bilirubin: 0.6 mg/dL (ref 0.3–1.2)
Total Protein: 7 g/dL (ref 6.5–8.1)

## 2021-06-04 LAB — PROTIME-INR
INR: 1 (ref 0.8–1.2)
Prothrombin Time: 12.7 seconds (ref 11.4–15.2)

## 2021-06-04 LAB — CBC WITH DIFFERENTIAL/PLATELET
Abs Immature Granulocytes: 0.09 10*3/uL — ABNORMAL HIGH (ref 0.00–0.07)
Basophils Absolute: 0 10*3/uL (ref 0.0–0.1)
Basophils Relative: 0 %
Eosinophils Absolute: 0.3 10*3/uL (ref 0.0–0.5)
Eosinophils Relative: 2 %
HCT: 46.9 % — ABNORMAL HIGH (ref 36.0–46.0)
Hemoglobin: 15.6 g/dL — ABNORMAL HIGH (ref 12.0–15.0)
Immature Granulocytes: 1 %
Lymphocytes Relative: 34 %
Lymphs Abs: 3.9 10*3/uL (ref 0.7–4.0)
MCH: 32.2 pg (ref 26.0–34.0)
MCHC: 33.3 g/dL (ref 30.0–36.0)
MCV: 96.9 fL (ref 80.0–100.0)
Monocytes Absolute: 0.9 10*3/uL (ref 0.1–1.0)
Monocytes Relative: 7 %
Neutro Abs: 6.4 10*3/uL (ref 1.7–7.7)
Neutrophils Relative %: 56 %
Platelets: 252 10*3/uL (ref 150–400)
RBC: 4.84 MIL/uL (ref 3.87–5.11)
RDW: 15.1 % (ref 11.5–15.5)
WBC: 11.6 10*3/uL — ABNORMAL HIGH (ref 4.0–10.5)
nRBC: 0 % (ref 0.0–0.2)

## 2021-06-04 LAB — TYPE AND SCREEN
ABO/RH(D): O POS
Antibody Screen: NEGATIVE

## 2021-06-04 LAB — APTT: aPTT: 30 seconds (ref 24–36)

## 2021-06-04 NOTE — Progress Notes (Signed)
PCP - Kathyrn Lass Cardiologist - denies  PPM/ICD - denies   Chest x-ray - n/a EKG - 01/14/17 Stress Test - denies ECHO - denies Cardiac Cath - denies  Sleep Study - denies   No diabetes  As of today, STOP taking any Aspirin (unless otherwise instructed by your surgeon) Aleve, Naproxen, Ibuprofen, Motrin, Advil, Goody's, BC's, all herbal medications, fish oil, and all vitamins.  ERAS Protocol -yes PRE-SURGERY Ensure or G2- ensure and instructions given  COVID TEST- ambulatory surgery. Not needed   Anesthesia review: no  Patient denies shortness of breath, fever, cough and chest pain at PAT appointment   All instructions explained to the patient, with a verbal understanding of the material. Patient agrees to go over the instructions while at home for a better understanding. Patient also instructed to self quarantine after being tested for COVID-19. The opportunity to ask questions was provided.

## 2021-06-08 DIAGNOSIS — F17210 Nicotine dependence, cigarettes, uncomplicated: Secondary | ICD-10-CM | POA: Diagnosis not present

## 2021-06-08 DIAGNOSIS — Z6830 Body mass index (BMI) 30.0-30.9, adult: Secondary | ICD-10-CM | POA: Diagnosis not present

## 2021-06-08 DIAGNOSIS — I700 Atherosclerosis of aorta: Secondary | ICD-10-CM | POA: Diagnosis not present

## 2021-06-08 DIAGNOSIS — E559 Vitamin D deficiency, unspecified: Secondary | ICD-10-CM | POA: Diagnosis not present

## 2021-06-08 DIAGNOSIS — Z1389 Encounter for screening for other disorder: Secondary | ICD-10-CM | POA: Diagnosis not present

## 2021-06-08 DIAGNOSIS — Z0000 Encounter for general adult medical examination without abnormal findings: Secondary | ICD-10-CM | POA: Diagnosis not present

## 2021-06-08 DIAGNOSIS — G629 Polyneuropathy, unspecified: Secondary | ICD-10-CM | POA: Diagnosis not present

## 2021-06-08 DIAGNOSIS — M810 Age-related osteoporosis without current pathological fracture: Secondary | ICD-10-CM | POA: Diagnosis not present

## 2021-06-08 DIAGNOSIS — G4700 Insomnia, unspecified: Secondary | ICD-10-CM | POA: Diagnosis not present

## 2021-06-10 ENCOUNTER — Ambulatory Visit (HOSPITAL_COMMUNITY): Payer: Medicare HMO

## 2021-06-10 ENCOUNTER — Ambulatory Visit (HOSPITAL_COMMUNITY)
Admission: RE | Admit: 2021-06-10 | Discharge: 2021-06-10 | Disposition: A | Discharge status: Home / Self Care | Payer: Medicare HMO | Attending: Orthopedic Surgery | Admitting: Orthopedic Surgery

## 2021-06-10 ENCOUNTER — Encounter (HOSPITAL_COMMUNITY): Payer: Self-pay | Admitting: Orthopedic Surgery

## 2021-06-10 ENCOUNTER — Other Ambulatory Visit: Payer: Self-pay

## 2021-06-10 ENCOUNTER — Ambulatory Visit (HOSPITAL_COMMUNITY): Payer: Medicare HMO | Admitting: Anesthesiology

## 2021-06-10 ENCOUNTER — Encounter (HOSPITAL_COMMUNITY)
Admission: RE | Disposition: A | Discharge status: Home / Self Care | Payer: Self-pay | Source: Home / Self Care | Attending: Orthopedic Surgery

## 2021-06-10 DIAGNOSIS — M9904 Segmental and somatic dysfunction of sacral region: Secondary | ICD-10-CM | POA: Insufficient documentation

## 2021-06-10 DIAGNOSIS — M533 Sacrococcygeal disorders, not elsewhere classified: Secondary | ICD-10-CM | POA: Diagnosis not present

## 2021-06-10 DIAGNOSIS — Z882 Allergy status to sulfonamides status: Secondary | ICD-10-CM | POA: Insufficient documentation

## 2021-06-10 DIAGNOSIS — F17210 Nicotine dependence, cigarettes, uncomplicated: Secondary | ICD-10-CM | POA: Diagnosis not present

## 2021-06-10 DIAGNOSIS — M461 Sacroiliitis, not elsewhere classified: Secondary | ICD-10-CM | POA: Diagnosis not present

## 2021-06-10 DIAGNOSIS — K219 Gastro-esophageal reflux disease without esophagitis: Secondary | ICD-10-CM | POA: Diagnosis not present

## 2021-06-10 DIAGNOSIS — Z981 Arthrodesis status: Secondary | ICD-10-CM | POA: Diagnosis not present

## 2021-06-10 DIAGNOSIS — Z888 Allergy status to other drugs, medicaments and biological substances status: Secondary | ICD-10-CM | POA: Insufficient documentation

## 2021-06-10 DIAGNOSIS — Z419 Encounter for procedure for purposes other than remedying health state, unspecified: Secondary | ICD-10-CM

## 2021-06-10 DIAGNOSIS — M4328 Fusion of spine, sacral and sacrococcygeal region: Secondary | ICD-10-CM | POA: Diagnosis not present

## 2021-06-10 DIAGNOSIS — E785 Hyperlipidemia, unspecified: Secondary | ICD-10-CM | POA: Diagnosis not present

## 2021-06-10 DIAGNOSIS — Z885 Allergy status to narcotic agent status: Secondary | ICD-10-CM | POA: Insufficient documentation

## 2021-06-10 DIAGNOSIS — F319 Bipolar disorder, unspecified: Secondary | ICD-10-CM | POA: Diagnosis not present

## 2021-06-10 HISTORY — PX: SACROILIAC JOINT FUSION: SHX6088

## 2021-06-10 LAB — SURGICAL PCR SCREEN
MRSA, PCR: NEGATIVE
Staphylococcus aureus: NEGATIVE

## 2021-06-10 SURGERY — SACROILIAC JOINT FUSION
Anesthesia: General | Site: Back | Laterality: Right

## 2021-06-10 MED ORDER — BUPIVACAINE LIPOSOME 1.3 % IJ SUSP
INTRAMUSCULAR | Status: AC
  Filled 2021-06-10: qty 20

## 2021-06-10 MED ORDER — CHLORHEXIDINE GLUCONATE 0.12 % MT SOLN
15.0000 mL | Freq: Once | OROMUCOSAL | Status: AC
  Administered 2021-06-10: 15 mL via OROMUCOSAL
  Filled 2021-06-10: qty 15

## 2021-06-10 MED ORDER — MEPERIDINE HCL 25 MG/ML IJ SOLN: 6.2500 mg | INTRAMUSCULAR | Status: DC | PRN

## 2021-06-10 MED ORDER — FENTANYL CITRATE (PF) 250 MCG/5ML IJ SOLN
INTRAMUSCULAR | Status: AC
  Filled 2021-06-10: qty 5

## 2021-06-10 MED ORDER — LIDOCAINE 2% (20 MG/ML) 5 ML SYRINGE
INTRAMUSCULAR | Status: DC | PRN
  Administered 2021-06-10: 40 mg via INTRAVENOUS

## 2021-06-10 MED ORDER — ONDANSETRON HCL 4 MG/2ML IJ SOLN
INTRAMUSCULAR | Status: AC
  Filled 2021-06-10: qty 2

## 2021-06-10 MED ORDER — LIDOCAINE 2% (20 MG/ML) 5 ML SYRINGE
INTRAMUSCULAR | Status: AC
  Filled 2021-06-10: qty 5

## 2021-06-10 MED ORDER — FENTANYL CITRATE (PF) 100 MCG/2ML IJ SOLN
25.0000 ug | INTRAMUSCULAR | Status: DC | PRN
  Administered 2021-06-10: 50 ug via INTRAVENOUS

## 2021-06-10 MED ORDER — LACTATED RINGERS IV SOLN: INTRAVENOUS | Status: DC

## 2021-06-10 MED ORDER — PHENYLEPHRINE HCL-NACL 10-0.9 MG/250ML-% IV SOLN
INTRAVENOUS | Status: DC | PRN
  Administered 2021-06-10: 100 ug/min via INTRAVENOUS

## 2021-06-10 MED ORDER — ACETAMINOPHEN 10 MG/ML IV SOLN
INTRAVENOUS | Status: AC
  Filled 2021-06-10: qty 100

## 2021-06-10 MED ORDER — OXYCODONE HCL 5 MG/5ML PO SOLN: 5.0000 mg | Freq: Once | ORAL | Status: AC | PRN

## 2021-06-10 MED ORDER — ACETAMINOPHEN 325 MG PO TABS: 325.0000 mg | ORAL_TABLET | ORAL | Status: DC | PRN

## 2021-06-10 MED ORDER — ACETAMINOPHEN 160 MG/5ML PO SOLN: 325.0000 mg | ORAL | Status: DC | PRN

## 2021-06-10 MED ORDER — OXYCODONE HCL 5 MG PO TABS
ORAL_TABLET | ORAL | Status: AC
  Administered 2021-06-10: 5 mg via ORAL
  Filled 2021-06-10: qty 1

## 2021-06-10 MED ORDER — METHOCARBAMOL 500 MG PO TABS
ORAL_TABLET | ORAL | Status: AC
  Administered 2021-06-10: 500 mg via ORAL
  Filled 2021-06-10: qty 1

## 2021-06-10 MED ORDER — 0.9 % SODIUM CHLORIDE (POUR BTL) OPTIME
TOPICAL | Status: DC | PRN
  Administered 2021-06-10: 1000 mL

## 2021-06-10 MED ORDER — ROCURONIUM BROMIDE 10 MG/ML (PF) SYRINGE
PREFILLED_SYRINGE | INTRAVENOUS | Status: DC | PRN
  Administered 2021-06-10: 50 mg via INTRAVENOUS

## 2021-06-10 MED ORDER — PHENYLEPHRINE 40 MCG/ML (10ML) SYRINGE FOR IV PUSH (FOR BLOOD PRESSURE SUPPORT)
PREFILLED_SYRINGE | INTRAVENOUS | Status: DC | PRN
  Administered 2021-06-10 (×5): 80 ug via INTRAVENOUS

## 2021-06-10 MED ORDER — SUGAMMADEX SODIUM 200 MG/2ML IV SOLN
INTRAVENOUS | Status: DC | PRN
  Administered 2021-06-10: 200 mg via INTRAVENOUS

## 2021-06-10 MED ORDER — HYDROCODONE-ACETAMINOPHEN 5-325 MG PO TABS: 1.0000 | ORAL_TABLET | Freq: Four times a day (QID) | ORAL | 0 refills | Status: DC | PRN

## 2021-06-10 MED ORDER — BUPIVACAINE-EPINEPHRINE (PF) 0.25% -1:200000 IJ SOLN
INTRAMUSCULAR | Status: DC | PRN
  Administered 2021-06-10: 3 mL
  Administered 2021-06-10: 20 mL

## 2021-06-10 MED ORDER — MIDAZOLAM HCL 2 MG/2ML IJ SOLN
INTRAMUSCULAR | Status: DC | PRN
  Administered 2021-06-10: 2 mg via INTRAVENOUS

## 2021-06-10 MED ORDER — ORAL CARE MOUTH RINSE: 15.0000 mL | Freq: Once | OROMUCOSAL | Status: AC

## 2021-06-10 MED ORDER — PHENYLEPHRINE 40 MCG/ML (10ML) SYRINGE FOR IV PUSH (FOR BLOOD PRESSURE SUPPORT)
PREFILLED_SYRINGE | INTRAVENOUS | Status: AC
  Filled 2021-06-10: qty 10

## 2021-06-10 MED ORDER — METHOCARBAMOL 500 MG PO TABS: 500.0000 mg | ORAL_TABLET | Freq: Once | ORAL | Status: AC

## 2021-06-10 MED ORDER — ROCURONIUM BROMIDE 10 MG/ML (PF) SYRINGE
PREFILLED_SYRINGE | INTRAVENOUS | Status: AC
  Filled 2021-06-10: qty 10

## 2021-06-10 MED ORDER — PROPOFOL 10 MG/ML IV BOLUS
INTRAVENOUS | Status: AC
  Filled 2021-06-10: qty 20

## 2021-06-10 MED ORDER — ACETAMINOPHEN 10 MG/ML IV SOLN
INTRAVENOUS | Status: DC | PRN
  Administered 2021-06-10: 1000 mg via INTRAVENOUS

## 2021-06-10 MED ORDER — PROPOFOL 10 MG/ML IV BOLUS
INTRAVENOUS | Status: DC | PRN
  Administered 2021-06-10: 120 mg via INTRAVENOUS

## 2021-06-10 MED ORDER — BUPIVACAINE-EPINEPHRINE (PF) 0.25% -1:200000 IJ SOLN
INTRAMUSCULAR | Status: AC
  Filled 2021-06-10: qty 30

## 2021-06-10 MED ORDER — METHOCARBAMOL 500 MG PO TABS: 500.0000 mg | ORAL_TABLET | Freq: Four times a day (QID) | ORAL | 0 refills | Status: DC | PRN

## 2021-06-10 MED ORDER — FENTANYL CITRATE (PF) 250 MCG/5ML IJ SOLN
INTRAMUSCULAR | Status: DC | PRN
  Administered 2021-06-10: 50 ug via INTRAVENOUS

## 2021-06-10 MED ORDER — FENTANYL CITRATE (PF) 100 MCG/2ML IJ SOLN
INTRAMUSCULAR | Status: AC
  Administered 2021-06-10: 50 ug via INTRAVENOUS
  Filled 2021-06-10: qty 2

## 2021-06-10 MED ORDER — DEXAMETHASONE SODIUM PHOSPHATE 10 MG/ML IJ SOLN
INTRAMUSCULAR | Status: AC
  Filled 2021-06-10: qty 1

## 2021-06-10 MED ORDER — CEFAZOLIN SODIUM-DEXTROSE 2-4 GM/100ML-% IV SOLN
2.0000 g | INTRAVENOUS | Status: AC
  Administered 2021-06-10: 2 g via INTRAVENOUS
  Filled 2021-06-10: qty 100

## 2021-06-10 MED ORDER — POVIDONE-IODINE 7.5 % EX SOLN: Freq: Once | CUTANEOUS | Status: DC

## 2021-06-10 MED ORDER — BUPIVACAINE LIPOSOME 1.3 % IJ SUSP
INTRAMUSCULAR | Status: DC | PRN
  Administered 2021-06-10: 20 mL

## 2021-06-10 MED ORDER — ONDANSETRON HCL 4 MG/2ML IJ SOLN
INTRAMUSCULAR | Status: DC | PRN
  Administered 2021-06-10: 4 mg via INTRAVENOUS

## 2021-06-10 MED ORDER — MIDAZOLAM HCL 2 MG/2ML IJ SOLN
INTRAMUSCULAR | Status: AC
  Filled 2021-06-10: qty 2

## 2021-06-10 MED ORDER — OXYCODONE HCL 5 MG PO TABS: 5.0000 mg | ORAL_TABLET | Freq: Once | ORAL | Status: AC | PRN

## 2021-06-10 MED ORDER — ONDANSETRON HCL 4 MG/2ML IJ SOLN: 4.0000 mg | Freq: Once | INTRAMUSCULAR | Status: DC | PRN

## 2021-06-10 SURGICAL SUPPLY — 67 items
APL SKNCLS STERI-STRIP NONHPOA (GAUZE/BANDAGES/DRESSINGS) ×1
BAG COUNTER SPONGE SURGICOUNT (BAG) ×2 IMPLANT
BAG SPNG CNTER NS LX DISP (BAG) ×1
BAG SURGICOUNT SPONGE COUNTING (BAG) ×1
BENZOIN TINCTURE PRP APPL 2/3 (GAUZE/BANDAGES/DRESSINGS) ×3 IMPLANT
BIT DRILL CANNULATED 7.0X3.2MM (BIT) IMPLANT
BLADE SURG 10 STRL SS (BLADE) ×3 IMPLANT
CANISTER SUCT 3000ML PPV (MISCELLANEOUS) ×3 IMPLANT
CLOSURE STERI-STRIP 1/2X4 (GAUZE/BANDAGES/DRESSINGS) ×1
CLOSURE WOUND 1/2 X4 (GAUZE/BANDAGES/DRESSINGS) ×1
CLSR STERI-STRIP ANTIMIC 1/2X4 (GAUZE/BANDAGES/DRESSINGS) ×1 IMPLANT
COVER SURGICAL LIGHT HANDLE (MISCELLANEOUS) ×6 IMPLANT
DRAPE C-ARM 42X72 X-RAY (DRAPES) ×3 IMPLANT
DRAPE C-ARMOR (DRAPES) ×3 IMPLANT
DRAPE INCISE IOBAN 66X45 STRL (DRAPES) ×3 IMPLANT
DRAPE POUCH INSTRU U-SHP 10X18 (DRAPES) ×3 IMPLANT
DRAPE SURG 17X23 STRL (DRAPES) ×9 IMPLANT
DRILL CANNULATED 7.0X3.2MM (BIT) ×3
DURAPREP 26ML APPLICATOR (WOUND CARE) ×3 IMPLANT
ELECT CAUTERY BLADE 6.4 (BLADE) ×3 IMPLANT
ELECT REM PT RETURN 9FT ADLT (ELECTROSURGICAL) ×3
ELECTRODE REM PT RTRN 9FT ADLT (ELECTROSURGICAL) ×1 IMPLANT
GAUZE 4X4 16PLY ~~LOC~~+RFID DBL (SPONGE) ×1 IMPLANT
GAUZE SPONGE 4X4 12PLY STRL (GAUZE/BANDAGES/DRESSINGS) ×3 IMPLANT
GLOVE SRG 8 PF TXTR STRL LF DI (GLOVE) ×1 IMPLANT
GLOVE SURG ENC MOIS LTX SZ7 (GLOVE) ×5 IMPLANT
GLOVE SURG ENC MOIS LTX SZ8 (GLOVE) ×3 IMPLANT
GLOVE SURG UNDER POLY LF SZ7 (GLOVE) ×5 IMPLANT
GLOVE SURG UNDER POLY LF SZ8 (GLOVE) ×3
GOWN STRL REUS W/ TWL LRG LVL3 (GOWN DISPOSABLE) ×2 IMPLANT
GOWN STRL REUS W/ TWL XL LVL3 (GOWN DISPOSABLE) ×1 IMPLANT
GOWN STRL REUS W/TWL LRG LVL3 (GOWN DISPOSABLE) ×6
GOWN STRL REUS W/TWL XL LVL3 (GOWN DISPOSABLE) ×6
IMPL IFUSE 7.0MMX55MM (Rod) IMPLANT
IMPL IFUSE 7.0X50 (Rod) IMPLANT
IMPLANT IFUSE 7.0MMX55MM (Rod) ×3 IMPLANT
IMPLANT IFUSE 7.0X50 (Rod) ×6 IMPLANT
KIT BASIN OR (CUSTOM PROCEDURE TRAY) ×3 IMPLANT
KIT TURNOVER KIT B (KITS) ×3 IMPLANT
NDL HYPO 25GX1X1/2 BEV (NEEDLE) ×1 IMPLANT
NEEDLE 22X1 1/2 (OR ONLY) (NEEDLE) ×3 IMPLANT
NEEDLE HYPO 25GX1X1/2 BEV (NEEDLE) ×3 IMPLANT
NS IRRIG 1000ML POUR BTL (IV SOLUTION) ×3 IMPLANT
PACK UNIVERSAL I (CUSTOM PROCEDURE TRAY) ×3 IMPLANT
PAD ARMBOARD 7.5X6 YLW CONV (MISCELLANEOUS) ×6 IMPLANT
PENCIL BUTTON HOLSTER BLD 10FT (ELECTRODE) ×3 IMPLANT
PIN PUSH 3.2MM (PIN) ×2 IMPLANT
PIN STEINMAN ×2 IMPLANT
PIN STEINMAN 3.2MM (PIN) ×6 IMPLANT
SPONGE T-LAP 18X18 ~~LOC~~+RFID (SPONGE) ×3 IMPLANT
STAPLER VISISTAT 35W (STAPLE) ×3 IMPLANT
STRIP CLOSURE SKIN 1/2X4 (GAUZE/BANDAGES/DRESSINGS) ×2 IMPLANT
SUT MNCRL AB 4-0 PS2 18 (SUTURE) ×3 IMPLANT
SUT VIC AB 0 CT1 18XCR BRD 8 (SUTURE) IMPLANT
SUT VIC AB 0 CT1 8-18 (SUTURE)
SUT VIC AB 1 CT1 18XCR BRD 8 (SUTURE) ×1 IMPLANT
SUT VIC AB 1 CT1 8-18 (SUTURE) ×3
SUT VIC AB 2-0 CT1 18 (SUTURE) ×2 IMPLANT
SUT VIC AB 2-0 CT2 18 VCP726D (SUTURE) ×5 IMPLANT
SYR BULB IRRIG 60ML STRL (SYRINGE) ×6 IMPLANT
SYR CONTROL 10ML LL (SYRINGE) ×3 IMPLANT
TAPE CLOTH SURG 4X10 WHT LF (GAUZE/BANDAGES/DRESSINGS) ×2 IMPLANT
TOWEL GREEN STERILE (TOWEL DISPOSABLE) ×6 IMPLANT
TOWEL GREEN STERILE FF (TOWEL DISPOSABLE) ×3 IMPLANT
TUBE CONNECTING 12'X1/4 (SUCTIONS) ×1
TUBE CONNECTING 12X1/4 (SUCTIONS) ×2 IMPLANT
YANKAUER SUCT BULB TIP NO VENT (SUCTIONS) ×3 IMPLANT

## 2021-06-10 NOTE — H&P (Signed)
PREOPERATIVE H&P  Chief Complaint: Right low back pain  HPI: Monique Perry is a 65 y.o. female who presents with ongoing pain in the low back on the right  Patient's exam and history is c/w right SI pain  Patient has failed multiple forms of conservative care and continues to have pain (see office notes for additional details regarding the patient's full course of treatment)  Past Medical History:  Diagnosis Date   Alcohol abuse last February 2012   Arthritis    Bipolar disorder ([place])    Collagenous colitis    Drug abuse ([place]) last 1990's   Peripheral neuropathy 11/29/2018   Pneumonia    Past Surgical History:  Procedure Laterality Date   ABDOMINAL HYSTERECTOMY  Age 56 years ago   partial    FOOT SURGERY     FRACTURE SURGERY     IR GENERIC HISTORICAL  07/16/2016   IR US GUIDE VASC ACCESS RIGHT 07/16/2016 Arne Cleveland, MD [facility]   IR GENERIC HISTORICAL  07/16/2016   IR ANGIOGRAM VISCERAL SELECTIVE 07/16/2016 Arne Cleveland, MD [facility]   IR GENERIC HISTORICAL  07/16/2016   IR ANGIOGRAM SELECTIVE EACH ADDITIONAL VESSEL 07/16/2016 Arne Cleveland, MD [facility]   IR GENERIC HISTORICAL  07/16/2016   IR EMBO ART  VEN HEMORR [place] 07/16/2016 Arne Cleveland, MD [facility]   IR GENERIC HISTORICAL  07/16/2016   IR ANGIOGRAM FOLLOW UP STUDY 07/16/2016 Arne Cleveland, MD [facility]   [place] fusion Right 04/17/2012   NOSE SURGERY     ORIF FIBULA FRACTURE Right 06/17/2020   Procedure: OPERATIVE TREATMENT OF RIGHT TIBIA AND FIBULA FRACTURE;  Surgeon: Erle Crocker, MD;  Location: [place];  Service: Orthopedics;  Laterality: Right;  LENGTH OF SURGERY 2HOURS   OSTEOTOMY Right 04/17/2012   Rt #2 Metatarsal   TONSILLECTOMY  As a child    WRIST SURGERY     Social History   Socioeconomic History   Marital status: Single    Spouse name: Not on file   Number of children: 0   Years of education: 66   Highest education level:  12th grade  Occupational History   Occupation: Disabled  Tobacco Use   Smoking status: Some Days    Packs/day: 0.75    Years: 45.00    Pack years: 33.75    Types: Cigarettes, Cigars   Smokeless tobacco: Never   Tobacco comments:    smoking cig for 35 years. continues to smoke 1/2 pack daily  Vaping Use   Vaping Use: Not on file  Substance and Sexual Activity   Alcohol use: Not Currently    Alcohol/week: 0.0 standard drinks    Comment: ETOH abuse for 25 year, 1/2 gallon Liquor daily. admits 6 weeks course of drinking ETOH as her only intake in 2011. she states that she quits her drinnking since Feb 2012.   Drug use: Not Currently    Comment: pots before, clean since 1995   Sexual activity: Not Currently  Other Topics Concern   Not on file  Social History Narrative   She lives with his stepson in [place]. Moved from [place] 2 years ago. Both  Parents lives in [place]. 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 Currently working at the [employer]). Did not complete High School .       She admits only 2-3 hours daily for 15 years. She does not sleep at nighttime. She would watch  TV and read at night time. Always sleep during the day. She has seen a psychiatrist at [facility] and still goes to the group therapy on Mondays. She has an follow up appt with her Psychiatrist on 08/07/12.      Right handed    Lives at home with mother    2-3 cups per day Caffeine    Social Determinants of Health   Financial Resource Strain: Low Risk    Difficulty of Paying Living Expenses: Not hard at all  Food Insecurity: No Food Insecurity   Worried About Charity fundraiser in the Last Year: Never true   Ran Out of Food in the Last Year: Never true  Transportation Needs: No Transportation Needs   Lack of Transportation (Medical): No   Lack of Transportation (Non-Medical): No  Physical Activity: Inactive   Days of Exercise per Week: 0 days   Minutes of Exercise per Session: 0  min  Stress: No Stress Concern Present   Feeling of Stress : Not at all  Social Connections: Moderately Isolated   Frequency of Communication with Friends and Family: More than three times a week   Frequency of Social Gatherings with Friends and Family: More than three times a week   Attends Religious Services: 1 to 4 times per year   Active Member of [employer] or Organizations: No   Attends Archivist Meetings: Never   Marital Status: Divorced   Family History  Problem Relation Age of Onset   Stroke Mother    Diabetes Father    Hypertension Father    Hyperlipidemia Father    Breast cancer Sister    Allergies  Allergen Reactions   Erythrocin Other (See Comments)    Thrush   Sulfa Antibiotics Other (See Comments)    Thrush    Trazodone And Nefazodone Other (See Comments)    Severe insomnia   Tramadol Itching   Prior to Admission medications   Medication Sig Start Date End Date Taking? Authorizing Provider  amitriptyline (ELAVIL) 75 MG tablet Take 75 mg by mouth in the morning and at bedtime.   Yes [provider]  b complex vitamins capsule Take 1 capsule by mouth daily.   Yes [provider]  ibuprofen (ADVIL) 200 MG tablet Take 800 mg by mouth every 6 (six) hours as needed for moderate pain.    Yes [provider]  mirtazapine (REMERON) 30 MG tablet Take 30 mg by mouth at bedtime.  12/14/19  Yes [provider]  pregabalin (LYRICA) 75 MG capsule Take 75 mg by mouth 3 (three) times daily.   Yes [provider]  vitamin C (ASCORBIC ACID) 500 MG tablet Take 500 mg by mouth daily.   Yes [provider]     All other systems have been reviewed and were otherwise negative with the exception of those mentioned in the HPI and as above.  Physical Exam: There were no vitals filed for this visit.  There is no height or weight on file to calculate BMI.  General: Alert, no acute distress Cardiovascular: No pedal  edema Respiratory: No cyanosis, no use of accessory musculature Skin: No lesions in the area of chief complaint Neurologic: Sensation intact distally Psychiatric: Patient is competent for consent with normal mood and affect Lymphatic: No axillary or cervical lymphadenopathy   Assessment/Plan: ONGOING RIGHT SACROILLIAC JOINT PAIN Plan for Procedure(s): RIGHT SACROILIAC JOINT FUSION   Norva Karvonen, MD 06/10/2021 6:33 AM

## 2021-06-10 NOTE — Anesthesia Preprocedure Evaluation (Addendum)
Anesthesia Evaluation  Patient identified by MRN, date of birth, ID band Patient awake    Reviewed: Allergy & Precautions, NPO status , Patient's Chart, lab work & pertinent test results  Airway Mallampati: II  TM Distance: >3 FB Neck ROM: Full    Dental  (+) Partial Upper, Missing, Dental Advisory Given, Caps, Implants   Pulmonary pneumonia, Current SmokerPatient did not abstain from smoking.,    Pulmonary exam normal breath sounds clear to auscultation       Cardiovascular + Peripheral Vascular Disease  Normal cardiovascular exam Rhythm:Regular Rate:Normal     Neuro/Psych PSYCHIATRIC DISORDERS Depression Bipolar Disorder  Neuromuscular disease    GI/Hepatic negative GI ROS, H/O of ETOH sober x 12 months   Endo/Other  negative endocrine ROS  Renal/GU negative Renal ROS     Musculoskeletal  (+) Arthritis ,   Abdominal   Peds  Hematology  (+) Blood dyscrasia, anemia ,   Anesthesia Other Findings   Reproductive/Obstetrics                            Anesthesia Physical  Anesthesia Plan  ASA: 3  Anesthesia Plan: General   Post-op Pain Management:    Induction: Intravenous  PONV Risk Score and Plan: 2 and Ondansetron, Treatment may vary due to age or medical condition and Midazolam  Airway Management Planned: Oral ETT and LMA  Additional Equipment: None  Intra-op Plan:   Post-operative Plan: Extubation in OR  Informed Consent: I have reviewed the patients History and Physical, chart, labs and discussed the procedure including the risks, benefits and alternatives for the proposed anesthesia with the patient or authorized representative who has indicated his/her understanding and acceptance.     Dental advisory given  Plan Discussed with: CRNA and Anesthesiologist  Anesthesia Plan Comments:        Anesthesia Quick Evaluation

## 2021-06-10 NOTE — Transfer of Care (Signed)
Immediate Anesthesia Transfer of Care Note  Patient: Monique Perry  Procedure(s) Performed: RIGHT SACROILIAC JOINT FUSION (Right: Back)  Patient Location: PACU  Anesthesia Type:General  Level of Consciousness: awake, alert , patient cooperative and responds to stimulation  Airway & Oxygen Therapy: Patient Spontanous Breathing and Patient connected to face mask oxygen  Post-op Assessment: Report given to RN and Post -op Vital signs reviewed and stable  Post vital signs: Reviewed and stable  Last Vitals:  Vitals Value Taken Time  BP 120/61 06/10/21 1305  Temp    Pulse 76 06/10/21 1306  Resp 23 06/10/21 1306  SpO2 96 % 06/10/21 1306  Vitals shown include unvalidated device data.  Last Pain:  Vitals:   06/10/21 0824  TempSrc:   PainSc: 6          Complications: No notable events documented.

## 2021-06-10 NOTE — Anesthesia Postprocedure Evaluation (Signed)
Anesthesia Post Note  Patient: Monique Perry  Procedure(s) Performed: RIGHT SACROILIAC JOINT FUSION (Right: Back)     Patient location during evaluation: PACU Anesthesia Type: General Level of consciousness: awake and alert Pain management: pain level controlled Vital Signs Assessment: post-procedure vital signs reviewed and stable Respiratory status: spontaneous breathing, nonlabored ventilation, respiratory function stable and patient connected to nasal cannula oxygen Cardiovascular status: blood pressure returned to baseline and stable Postop Assessment: no apparent nausea or vomiting Anesthetic complications: no   No notable events documented.  Last Vitals:  Vitals:   06/10/21 1350 06/10/21 1405  BP: (!) 101/59 (!) 107/57  Pulse: 67 68  Resp: 17 13  Temp:  (!) 36.4 C  SpO2: (!) 88% 94%    Last Pain:  Vitals:   06/10/21 1405  TempSrc:   PainSc: 5                  Addylin Manke

## 2021-06-10 NOTE — Op Note (Signed)
NAME:  Monique Perry. Gordan          MEDICAL RECORD NO.:  [id number]   DATE OF BIRTH:  1956/07/23   DATE OF PROCEDURE:  06/10/2021                               OPERATIVE REPORT     PREOPERATIVE DIAGNOSES: 1.  Right sacroiliac joint dysfunction   POSTOPERATIVE DIAGNOSES: 1.  Right sacroiliac joint dysfunction   PROCEDURES: Minimally invasive right sacroiliac joint fusion using iFuse 3D implants   SURGEON:  Phylliss Bob, MD.   ASSISTANTPricilla Holm, PA-C.   ANESTHESIA:  General endotracheal anesthesia.   COMPLICATIONS:  None.   DISPOSITION:  Stable.   ESTIMATED BLOOD LOSS:  Minimal.   INDICATIONS FOR SURGERY:  Briefly, [name] is a pleasant 65 year old female, who did present to me with ongoing severe pain at the right side of her low back.  Her work-up was diagnostic for right sacroiliac joint dysfunction.  She did fail appropriate conservative treatment measures, and as such, we did discuss proceeding with the surgery noted above.  The patient was made fully aware of the risks and recovery period associated with surgery, and she did wish to proceed.   OPERATIVE DETAILS:  On 06/10/2021, the patient was brought to surgery and general endotracheal anesthesia was administered.  The patient was placed prone on a flat Jackson bed, withdrawal was placed beneath the patient's chest and hips.  The region of the right buttock was prepped and draped in the usual sterile fashion.  A timeout procedure was performed.  Fluoroscopy was brought into the field.  I was able to ensure adequate lateral, inlet, and outlet radiographs.  At this point, a 3 cm incision was made in line with the posterior border of the sacrum on the right.  3 guidewires were advanced across the sacroiliac joint on the right side.  Fluoroscopy was liberally used while advancing the guidewires in order to ensure a safe trajectory of the guidewires..  I then drilled and broached over the guidewires.  At this point, 7 mm  implants were advanced across the sacroiliac joints from superiorly to inferiorly, the length of the implants were 55 mm, 50 mm, and 50 mm.  I was very pleased with the resting position of the implants on lateral, inlet, and outlet fluoroscopy.  The guidewires were then removed.  I was very pleased with the final lateral, inlet, and outlet radiographs.  At this point, the wound was copiously irrigated and closed in layers, using #1 Vicryl, followed by 2-0 Vicryl, followed by 4-0 Monocryl.  All instrument counts were correct at the termination of the procedure.     Of note, Pricilla Holm was my assistant throughout surgery, and did aid in retraction, suctioning, placement of the hardware, and closure from start to finish.     Phylliss Bob, MD

## 2021-06-10 NOTE — Anesthesia Procedure Notes (Signed)
Procedure Name: Intubation Date/Time: 06/10/2021 11:41 AM Performed by: Michele Rockers, CRNA Pre-anesthesia Checklist: Patient identified, Patient being monitored, Timeout performed, Emergency Drugs available and Suction available Patient Re-evaluated:Patient Re-evaluated prior to induction Oxygen Delivery Method: Circle system utilized Preoxygenation: Pre-oxygenation with 100% oxygen Induction Type: IV induction Ventilation: Mask ventilation without difficulty Laryngoscope Size: Mac and 3 Grade View: Grade I Tube type: Oral Tube size: 7.5 mm Number of attempts: 1 Airway Equipment and Method: Stylet Placement Confirmation: ETT inserted through vocal cords under direct vision, positive ETCO2 and breath sounds checked- equal and bilateral Secured at: 21 cm Tube secured with: Tape Dental Injury: Teeth and Oropharynx as per pre-operative assessment

## 2021-06-11 ENCOUNTER — Encounter (HOSPITAL_COMMUNITY): Payer: Self-pay | Admitting: Orthopedic Surgery

## 2021-06-12 ENCOUNTER — Encounter (HOSPITAL_COMMUNITY): Payer: Self-pay | Admitting: Orthopedic Surgery

## 2021-06-24 DIAGNOSIS — H109 Unspecified conjunctivitis: Secondary | ICD-10-CM | POA: Diagnosis not present

## 2021-07-24 DIAGNOSIS — M461 Sacroiliitis, not elsewhere classified: Secondary | ICD-10-CM | POA: Diagnosis not present

## 2021-07-28 DIAGNOSIS — K1329 Other disturbances of oral epithelium, including tongue: Secondary | ICD-10-CM | POA: Diagnosis not present

## 2021-09-08 DIAGNOSIS — M461 Sacroiliitis, not elsewhere classified: Secondary | ICD-10-CM | POA: Diagnosis not present

## 2021-09-21 DIAGNOSIS — R69 Illness, unspecified: Secondary | ICD-10-CM | POA: Diagnosis not present

## 2021-10-16 DIAGNOSIS — M79671 Pain in right foot: Secondary | ICD-10-CM | POA: Diagnosis not present

## 2021-11-04 DIAGNOSIS — F411 Generalized anxiety disorder: Secondary | ICD-10-CM | POA: Diagnosis not present

## 2021-11-04 DIAGNOSIS — F331 Major depressive disorder, recurrent, moderate: Secondary | ICD-10-CM | POA: Diagnosis not present

## 2021-11-26 ENCOUNTER — Other Ambulatory Visit: Payer: Self-pay | Admitting: Family Medicine

## 2021-11-26 DIAGNOSIS — Z1231 Encounter for screening mammogram for malignant neoplasm of breast: Secondary | ICD-10-CM

## 2021-12-29 ENCOUNTER — Ambulatory Visit
Admission: RE | Admit: 2021-12-29 | Discharge: 2021-12-29 | Disposition: A | Discharge status: Home / Self Care | Payer: Medicare HMO | Source: Ambulatory Visit | Attending: Family Medicine | Admitting: Family Medicine

## 2021-12-29 DIAGNOSIS — Z1231 Encounter for screening mammogram for malignant neoplasm of breast: Secondary | ICD-10-CM | POA: Diagnosis not present

## 2022-01-08 DIAGNOSIS — I700 Atherosclerosis of aorta: Secondary | ICD-10-CM | POA: Diagnosis not present

## 2022-01-08 DIAGNOSIS — Z720 Tobacco use: Secondary | ICD-10-CM | POA: Diagnosis not present

## 2022-01-08 DIAGNOSIS — R1030 Lower abdominal pain, unspecified: Secondary | ICD-10-CM | POA: Diagnosis not present

## 2022-01-13 ENCOUNTER — Other Ambulatory Visit: Payer: Self-pay | Admitting: Family Medicine

## 2022-01-13 DIAGNOSIS — R1030 Lower abdominal pain, unspecified: Secondary | ICD-10-CM

## 2022-02-05 ENCOUNTER — Other Ambulatory Visit: Payer: Self-pay

## 2022-02-05 ENCOUNTER — Ambulatory Visit
Admission: RE | Admit: 2022-02-05 | Discharge: 2022-02-05 | Disposition: A | Discharge status: Home / Self Care | Payer: Medicare HMO | Source: Ambulatory Visit | Attending: Family Medicine | Admitting: Family Medicine

## 2022-02-05 DIAGNOSIS — R1030 Lower abdominal pain, unspecified: Secondary | ICD-10-CM

## 2022-02-05 DIAGNOSIS — K760 Fatty (change of) liver, not elsewhere classified: Secondary | ICD-10-CM | POA: Diagnosis not present

## 2022-02-05 DIAGNOSIS — K5730 Diverticulosis of large intestine without perforation or abscess without bleeding: Secondary | ICD-10-CM | POA: Diagnosis not present

## 2022-02-05 DIAGNOSIS — N200 Calculus of kidney: Secondary | ICD-10-CM | POA: Diagnosis not present

## 2022-02-05 DIAGNOSIS — D7389 Other diseases of spleen: Secondary | ICD-10-CM | POA: Diagnosis not present

## 2022-02-05 MED ORDER — IOPAMIDOL (ISOVUE-300) INJECTION 61%
100.0000 mL | Freq: Once | INTRAVENOUS | Status: AC | PRN
  Administered 2022-02-05: 100 mL via INTRAVENOUS

## 2022-02-09 ENCOUNTER — Encounter (HOSPITAL_COMMUNITY): Payer: Self-pay | Admitting: Emergency Medicine

## 2022-02-09 ENCOUNTER — Other Ambulatory Visit: Payer: Self-pay

## 2022-02-09 ENCOUNTER — Emergency Department (HOSPITAL_COMMUNITY): Payer: Medicare HMO

## 2022-02-09 ENCOUNTER — Emergency Department (HOSPITAL_COMMUNITY)
Admission: EM | Admit: 2022-02-09 | Discharge: 2022-02-09 | Disposition: A | Discharge status: Home / Self Care | Payer: Medicare HMO | Attending: Emergency Medicine | Admitting: Emergency Medicine

## 2022-02-09 DIAGNOSIS — K5730 Diverticulosis of large intestine without perforation or abscess without bleeding: Secondary | ICD-10-CM | POA: Diagnosis not present

## 2022-02-09 DIAGNOSIS — R109 Unspecified abdominal pain: Secondary | ICD-10-CM | POA: Diagnosis not present

## 2022-02-09 DIAGNOSIS — K6389 Other specified diseases of intestine: Secondary | ICD-10-CM | POA: Diagnosis not present

## 2022-02-09 DIAGNOSIS — R110 Nausea: Secondary | ICD-10-CM | POA: Insufficient documentation

## 2022-02-09 DIAGNOSIS — K449 Diaphragmatic hernia without obstruction or gangrene: Secondary | ICD-10-CM | POA: Diagnosis not present

## 2022-02-09 DIAGNOSIS — R1084 Generalized abdominal pain: Secondary | ICD-10-CM | POA: Diagnosis not present

## 2022-02-09 DIAGNOSIS — N200 Calculus of kidney: Secondary | ICD-10-CM | POA: Diagnosis not present

## 2022-02-09 LAB — COMPREHENSIVE METABOLIC PANEL
ALT: 20 U/L (ref 0–44)
AST: 34 U/L (ref 15–41)
Albumin: 3.1 g/dL — ABNORMAL LOW (ref 3.5–5.0)
Alkaline Phosphatase: 68 U/L (ref 38–126)
Anion gap: 9 (ref 5–15)
BUN: 8 mg/dL (ref 8–23)
CO2: 27 mmol/L (ref 22–32)
Calcium: 8.4 mg/dL — ABNORMAL LOW (ref 8.9–10.3)
Chloride: 104 mmol/L (ref 98–111)
Creatinine, Ser: 0.85 mg/dL (ref 0.44–1.00)
GFR, Estimated: >60 mL/min (ref >60)
Glucose, Bld: 102 mg/dL — ABNORMAL HIGH (ref 70–99)
Potassium: 3.5 mmol/L (ref 3.5–5.1)
Sodium: 140 mmol/L (ref 135–145)
Total Bilirubin: 0.3 mg/dL (ref 0.3–1.2)
Total Protein: 6 g/dL — ABNORMAL LOW (ref 6.5–8.1)

## 2022-02-09 LAB — URINALYSIS, ROUTINE W REFLEX MICROSCOPIC
Bilirubin Urine: NEGATIVE
Glucose, UA: NEGATIVE mg/dL
Hgb urine dipstick: NEGATIVE
Ketones, ur: NEGATIVE mg/dL
Nitrite: NEGATIVE
Protein, ur: NEGATIVE mg/dL
Specific Gravity, Urine: 1.008 (ref 1.005–1.030)
pH: 6 (ref 5.0–8.0)

## 2022-02-09 LAB — CBC WITH DIFFERENTIAL/PLATELET
Abs Immature Granulocytes: 0.05 10*3/uL (ref 0.00–0.07)
Basophils Absolute: 0 10*3/uL (ref 0.0–0.1)
Basophils Relative: 0 %
Eosinophils Absolute: 0.2 10*3/uL (ref 0.0–0.5)
Eosinophils Relative: 3 %
HCT: 41.8 % (ref 36.0–46.0)
Hemoglobin: 13.7 g/dL (ref 12.0–15.0)
Immature Granulocytes: 1 %
Lymphocytes Relative: 34 %
Lymphs Abs: 1.9 10*3/uL (ref 0.7–4.0)
MCH: 33.7 pg (ref 26.0–34.0)
MCHC: 32.8 g/dL (ref 30.0–36.0)
MCV: 102.7 fL — ABNORMAL HIGH (ref 80.0–100.0)
Monocytes Absolute: 0.5 10*3/uL (ref 0.1–1.0)
Monocytes Relative: 8 %
Neutro Abs: 3 10*3/uL (ref 1.7–7.7)
Neutrophils Relative %: 54 %
Platelets: 190 10*3/uL (ref 150–400)
RBC: 4.07 MIL/uL (ref 3.87–5.11)
RDW: 16.9 % — ABNORMAL HIGH (ref 11.5–15.5)
WBC: 5.6 10*3/uL (ref 4.0–10.5)
nRBC: 0 % (ref 0.0–0.2)

## 2022-02-09 LAB — LIPASE, BLOOD: Lipase: 30 U/L (ref 11–51)

## 2022-02-09 MED ORDER — DICYCLOMINE HCL 20 MG PO TABS: 20.0000 mg | ORAL_TABLET | Freq: Three times a day (TID) | ORAL | 0 refills | Status: DC | PRN

## 2022-02-09 MED ORDER — MORPHINE SULFATE (PF) 4 MG/ML IV SOLN
4.0000 mg | Freq: Once | INTRAVENOUS | Status: AC
  Administered 2022-02-09: 4 mg via INTRAVENOUS
  Filled 2022-02-09: qty 1

## 2022-02-09 MED ORDER — ONDANSETRON HCL 4 MG/2ML IJ SOLN
4.0000 mg | Freq: Once | INTRAMUSCULAR | Status: AC
  Administered 2022-02-09: 4 mg via INTRAVENOUS
  Filled 2022-02-09: qty 2

## 2022-02-09 NOTE — ED Notes (Signed)
Patient informed that urine is needed to testing, states she is not able at time.

## 2022-02-09 NOTE — Discharge Instructions (Signed)

## 2022-02-09 NOTE — ED Triage Notes (Signed)
Patient arrived via EMS from home [facility] D[facility]) with complaints of bilateral flank pain that has been ongoing for x2 months, states worse in the last 2 days. Patient states she had outpatient MRI last week and was diagnosed with kidneys stone on the right. Patient rate pain 10/10 on pain scale of 0-10. Patient alerts and oriented x4 at this time.

## 2022-02-09 NOTE — ED Provider Notes (Signed)
Emergency Department Provider Note   I have reviewed the triage vital signs and the nursing notes.   HISTORY  Chief Complaint Flank Pain (Bilateral flank pain)   HPI Monique Perry is a 66 y.o. female with past medical history reviewed below presents emergency department for evaluation of severe, intermittent right flank pain.  She notes that she saw her PCP on 2/24 with abdominal discomfort which was less severe at that time.  There was concern for possible diverticulitis, according to the patient, and she was sent for an outpatient CT.  She looked through the results with a family member and attributed her symptoms to a kidney stone on the right although was found to be in the lower pole of the right kidney and not a ureteral stone per chart review.  Patient states that since that CT scan she has had sudden worsening pain in the right flank which has been severe at times.  Her pain now is improved but will have waves of discomfort with nausea. No vomiting or diarrhea. No fever or chills. No CP or SOB.    Past Medical History:  Diagnosis Date   Alcohol abuse last February 2012   Arthritis    Bipolar disorder ([place])    Collagenous colitis    Drug abuse ([place]) last 1990's   Peripheral neuropathy 11/29/2018   Pneumonia     Review of Systems  Constitutional: No fever/chills Eyes: No visual changes. ENT: No sore throat. Cardiovascular: Denies chest pain. Respiratory: Denies shortness of breath. Gastrointestinal: Positive right flank/abdominal pain. Positive nausea, no vomiting.  No diarrhea.  No constipation. Genitourinary: Negative for dysuria. Musculoskeletal: Negative for back pain. Skin: Negative for rash. Neurological: Negative for headaches, focal weakness or numbness.   ____________________________________________   PHYSICAL EXAM:  VITAL SIGNS: ED Triage Vitals  Enc Vitals Group     BP 02/09/22 1641 133/72     Pulse Rate 02/09/22 1641 86     Resp 02/09/22  1641 16     Temp 02/09/22 1641 97.9 F (36.6 C)     Temp Source 02/09/22 1641 Oral     SpO2 02/09/22 1641 95 %   Constitutional: Alert and oriented. Well appearing and in no acute distress. Eyes: Conjunctivae are normal.  Head: Atraumatic. Nose: No congestion/rhinnorhea. Mouth/Throat: Mucous membranes are moist.  Neck: No stridor.  Cardiovascular: Normal rate, regular rhythm. Good peripheral circulation. Grossly normal heart sounds.   Respiratory: Normal respiratory effort.  No retractions. Lungs CTAB. Gastrointestinal: Soft and nontender. No distention.  Musculoskeletal:  No gross deformities of extremities. Neurologic:  Normal speech and language.  Skin:  Skin is warm, dry and intact. No rash noted.   ____________________________________________   LABS (all labs ordered are listed, but only abnormal results are displayed)  Labs Reviewed  URINE CULTURE - Abnormal; Notable for the following components:      Result Value   Culture   (*)    Value: >=100,000 COLONIES/mL ENTEROCOCCUS FAECALIS SUSCEPTIBILITIES TO FOLLOW Performed at [facility], [address] [place], [place] [address]    All other components within normal limits  URINALYSIS, ROUTINE W REFLEX MICROSCOPIC - Abnormal; Notable for the following components:   Leukocytes,Ua SMALL (*)    Bacteria, UA FEW (*)    All other components within normal limits  COMPREHENSIVE METABOLIC PANEL - Abnormal; Notable for the following components:   Glucose, Bld 102 (*)    Calcium 8.4 (*)    Total Protein 6.0 (*)  Albumin 3.1 (*)    All other components within normal limits  CBC WITH DIFFERENTIAL/PLATELET - Abnormal; Notable for the following components:   MCV 102.7 (*)    RDW 16.9 (*)    All other components within normal limits  LIPASE, BLOOD    ____________________________________________   PROCEDURES  Procedure(s) performed:   Procedures  None ____________________________________________   INITIAL  IMPRESSION / ASSESSMENT AND PLAN / ED COURSE  Pertinent labs & imaging results that were available during my care of the patient were reviewed by me and considered in my medical decision making (see chart for details).   This patient is Presenting for Evaluation of abdominal pain, which does require a range of treatment options, and is a complaint that involves a high risk of morbidity and mortality.  The Differential Diagnoses  includes but is not exclusive to ectopic pregnancy, ovarian cyst, ovarian torsion, acute appendicitis, urinary tract infection, endometriosis, bowel obstruction, hernia, colitis, renal colic, gastroenteritis, volvulus etc.   Critical Interventions- IV pain and nausea medication   Medications  morphine (PF) 4 MG/ML injection 4 mg (4 mg Intravenous Given 02/09/22 1901)  ondansetron (ZOFRAN) injection 4 mg (4 mg Intravenous Given 02/09/22 1901)    Reassessment after intervention:  pain improved.   I decided to review pertinent External Data, and in summary CT abdomen/pelvis w/ contrast reviewed from 2/24.   Clinical Laboratory Tests Ordered, included CBC without leukocytosis. No AKI or electrolyte disturbance. UA with few bacteria and small leukocytes. Will send for culture.   Radiologic Tests Ordered, included CT renal. I independently interpreted the images and agree with radiology interpretation.   Social Determinants of Health Risk patient has a smoking history.    Medical Decision Making: Summary:  Patient presents to the emergency department with abdominal pain in the right flank/abdomen.  Symptoms worsening suddenly since her most recent CT scan 4 days prior.  Abdomen is soft.  Question if her right renal stone may have moved to a ureteral position to cause pain.  Vital signs are within normal limits.  Plan to repeat CT scan without contrast and repeat blood work for comparison to work-up done 4 days prior.  CT as above. Discussed with patient. Pain well  controlled. Plan for d/c on bentyl. UA sent for culture. No UTI symptoms.   Reevaluation with update and discussion with patient. Feeling well. Plan for supportive care and d/c.   Disposition: discharge  ____________________________________________  FINAL CLINICAL IMPRESSION(S) / ED DIAGNOSES  Final diagnoses:  Right flank pain     NEW OUTPATIENT MEDICATIONS STARTED DURING THIS VISIT:  Discharge Medication List as of 02/09/2022  9:22 PM     START taking these medications   Details  dicyclomine (BENTYL) 20 MG tablet Take 1 tablet (20 mg total) by mouth 3 (three) times daily as needed for spasms., Starting Tue 02/09/2022, Print        Note:  This document was prepared using Dragon voice recognition software and may include unintentional dictation errors.  Nanda Quinton, MD, [facility] Emergency Medicine    Ellsworth Waldschmidt, Wonda Olds, MD 02/11/22 1110

## 2022-02-11 ENCOUNTER — Encounter: Payer: Self-pay | Admitting: Physician Assistant

## 2022-02-12 LAB — URINE CULTURE: Culture: >=100000 — AB

## 2022-02-13 ENCOUNTER — Telehealth (HOSPITAL_BASED_OUTPATIENT_CLINIC_OR_DEPARTMENT_OTHER): Payer: Self-pay | Admitting: *Deleted

## 2022-02-13 NOTE — Telephone Encounter (Signed)
Post ED Visit - Positive Culture Follow-up: Successful Patient Follow-Up ? ?Culture assessed and recommendations reviewed by: ? ?'[]'$  Elenor Quinones, Pharm.D. ?'[]'$  Heide Guile, Pharm.D., BCPS AQ-ID ?'[]'$  Parks Neptune, Pharm.D., BCPS ?'[]'$  Alycia Rossetti, Pharm.D., BCPS ?'[]'$  Meyers, Pharm.D., BCPS, AAHIVP ?'[]'$  Legrand Como, Pharm.D., BCPS, AAHIVP ?'[]'$  Salome Arnt, PharmD, BCPS ?'[]'$  Johnnette Gourd, PharmD, BCPS ?'[]'$  Hughes Better, PharmD, BCPS ?'[]'$  Bertis Ruddy PharmD ? ?Positive urine culture ? ?'[x]'$  Patient discharged without antimicrobial prescription and treatment is now indicated ?'[]'$  Organism is resistant to prescribed ED discharge antimicrobial ?'[]'$  Patient with positive blood cultures ? ?Changes discussed with ED provider: Theodis Blaze, PA ?New antibiotic prescription Amoxil '500mg'$  PO q8hrs x 5 days ?Called to [facility] [address], [place] ? ?Contacted patient, date 02/13/22, time 1300 ? ? ?Rosie Fate ?02/13/2022, 1:36 PM ? ?  ?

## 2022-02-13 NOTE — Progress Notes (Signed)
ED Antimicrobial Stewardship Positive Culture Follow Up   Monique Perry is an 66 y.o. female who presented to [facility] on 02/09/2022 with a chief complaint of  Chief Complaint  Patient presents with   Flank Pain    Bilateral flank pain    Recent Results (from the past 720 hour(s))  Urine Culture     Status: Abnormal   Collection Time: 02/09/22  6:50 PM   Specimen: Urine, Clean Catch  Result Value Ref Range Status   Specimen Description URINE, CLEAN CATCH  Final   Special Requests   Final    NONE Performed at [facility], [place] [address], [place], [place] [address]    Culture >=100,000 COLONIES/mL ENTEROCOCCUS FAECALIS (A)  Final   Report Status 02/12/2022 FINAL  Final   Organism ID, Bacteria ENTEROCOCCUS FAECALIS (A)  Final      Susceptibility   Enterococcus faecalis - MIC*    AMPICILLIN <=2 SENSITIVE Sensitive     NITROFURANTOIN <=16 SENSITIVE Sensitive     VANCOMYCIN 1 SENSITIVE Sensitive     * >=100,000 COLONIES/mL ENTEROCOCCUS FAECALIS    Plan: Call for symptom check, if symptomatic give amoxil '500mg'$  q8h x 5d  ED Provider: Theodis Blaze, Reece Leader 02/13/2022, 11:16 AM Clinical Pharmacist Monday - Friday phone -  [phone] Saturday - Sunday phone - [phone]

## 2022-02-23 ENCOUNTER — Ambulatory Visit: Payer: Medicare HMO | Admitting: Physician Assistant

## 2022-02-23 ENCOUNTER — Encounter: Payer: Self-pay | Admitting: Physician Assistant

## 2022-02-23 VITALS — BP 128/70 | HR 60 | Ht 66.0 in | Wt 155.0 lb

## 2022-02-23 DIAGNOSIS — K582 Mixed irritable bowel syndrome: Secondary | ICD-10-CM | POA: Diagnosis not present

## 2022-02-23 DIAGNOSIS — R1031 Right lower quadrant pain: Secondary | ICD-10-CM | POA: Diagnosis not present

## 2022-02-23 DIAGNOSIS — R1032 Left lower quadrant pain: Secondary | ICD-10-CM | POA: Diagnosis not present

## 2022-02-23 DIAGNOSIS — G8929 Other chronic pain: Secondary | ICD-10-CM

## 2022-02-23 DIAGNOSIS — R935 Abnormal findings on diagnostic imaging of other abdominal regions, including retroperitoneum: Secondary | ICD-10-CM

## 2022-02-23 DIAGNOSIS — K219 Gastro-esophageal reflux disease without esophagitis: Secondary | ICD-10-CM

## 2022-02-23 MED ORDER — DICYCLOMINE HCL 20 MG PO TABS: 20.0000 mg | ORAL_TABLET | Freq: Four times a day (QID) | ORAL | 5 refills | Status: AC

## 2022-02-23 MED ORDER — OMEPRAZOLE 20 MG PO CPDR: 20.0000 mg | DELAYED_RELEASE_CAPSULE | Freq: Every day | ORAL | 11 refills | Status: DC

## 2022-02-23 NOTE — Progress Notes (Signed)
? ?Chief Complaint: Generalized abdominal pain ? ?HPI: ?   Monique Perry is a 66 year old female with a past medical history of alcohol abuse and collagenous colitis, previously known to Dr. Benson Norway for colonoscopy 07/16/2016 (she believes she got a splenic rupture after time of this colonoscopy and eventually had a splenic artery embolization by IR), who was referred to me by Kathyrn Lass, MD for a complaint of generalized abdominal pain. ?   02/08/2022 CT of the abdomen pelvis with contrast with hepatic steatosis, right lower pole nephrolithiasis, exophytic lesion of the upper aspect of the spleen slightly smaller in size since prior study likely representing hemangioma, sigmoid diverticulosis and aortic atherosclerosis. ?   02/09/2022 ER visit with CT renal stone study with no evidence of intestinal obstruction or pneumoperitoneum or hydronephrosis, mild wall thickening and submucosal low-attenuation in the ascending colon which may be due to incomplete distention or suggest chronic nonspecific colitis, small hiatal hernia, right renal stones, mild nodularity liver surface suggesting possible cirrhosis, diverticulosis of the colon without diverticulitis, mild wall thickening in the gallbladder most likely due to incomplete distention, small pericardial effusion and 9 mm nodular density in the left adrenal suggesting possible adenoma. ?   02/09/2022 CBC normal.  CMP with a low albumin at 3.1 patient was discharged from the ER on Bentyl 20 mg 3 times daily. ?   02/13/2022 urinalysis came back with bacteria and she was started on Amoxicillin 500 mg p.o. every 8 hours x5 days. ?   Today, the patient is very histrionic, it is hard to keep her on track.  She basically has a lot of questions about her recent CT.  As far as ongoing symptoms describes some chronic left lower quadrant pain and right lower quadrant pain which hurts worse when she is laying on those sides to sleep and also "flares up from time to time in between".   Describes that her bowel habits radiate between hard to watery diarrhea and she has been told she has IBS in the past, this has not changed lately.  Does tell me that Bentyl 20 mg did help with the pain but she is out of this medicine. ?   Also has questions about her "hiatal hernia" found on CT and tells me that she does have reflux symptoms after she eats or drinks anything.  Describes that she cooks at home and never goes out anywhere and only has milk water or tea to drink. ?   Tells me she finished antibiotic therapy for her UTI but does not feel much different. ?   Lives at home alone with her cat. ?   Denies fever, chills, weight loss or blood in her stool. ? ?Past Medical History:  ?Diagnosis Date  ? Alcohol abuse last February 2012  ? Arthritis   ? Bipolar disorder ([place])   ? Collagenous colitis   ? Drug abuse ([place]) last 1990's  ? Peripheral neuropathy 11/29/2018  ? Pneumonia   ? ? ?Past Surgical History:  ?Procedure Laterality Date  ? ABDOMINAL HYSTERECTOMY  Age 31 years ago  ? partial   ? FOOT SURGERY    ? FRACTURE SURGERY    ? IR GENERIC HISTORICAL  07/16/2016  ? IR US GUIDE VASC ACCESS RIGHT 07/16/2016 Arne Cleveland, MD [facility]  ? IR GENERIC HISTORICAL  07/16/2016  ? IR ANGIOGRAM VISCERAL SELECTIVE 07/16/2016 Arne Cleveland, MD [facility]  ? IR GENERIC HISTORICAL  07/16/2016  ? IR ANGIOGRAM SELECTIVE EACH ADDITIONAL VESSEL 07/16/2016 Arne Cleveland,  MD [facility]  ? IR GENERIC HISTORICAL  07/16/2016  ? IR EMBO ART  VEN HEMORR LYMPH EXTRAV  INC GUIDE ROADMAPPING 07/16/2016 Arne Cleveland, MD [facility]  ? IR GENERIC HISTORICAL  07/16/2016  ? IR ANGIOGRAM FOLLOW UP STUDY 07/16/2016 Arne Cleveland, MD [facility]  ? [place] fusion Right 04/17/2012  ? NOSE SURGERY    ? ORIF FIBULA FRACTURE Right 06/17/2020  ? Procedure: OPERATIVE TREATMENT OF RIGHT TIBIA AND FIBULA FRACTURE;  Surgeon: Erle Crocker, MD;  Location: [place];  Service: Orthopedics;  Laterality: Right;  LENGTH OF SURGERY 2HOURS  ?  OSTEOTOMY Right 04/17/2012  ? Rt #2 Metatarsal  ? SACROILIAC JOINT FUSION Right 06/10/2021  ? Procedure: RIGHT SACROILIAC JOINT FUSION;  Surgeon: Phylliss Bob, MD;  Location: [place];  Service: Orthopedics;  Laterality: Right;  ? TONSILLECTOMY  As a child   ? WRIST SURGERY    ? ? ?Current Outpatient Medications  ?Medication Sig Dispense Refill  ? amitriptyline (ELAVIL) 75 MG tablet Take 75 mg by mouth in the morning and at bedtime.    ? b complex vitamins capsule Take 1 capsule by mouth daily.    ? dicyclomine (BENTYL) 20 MG tablet Take 1 tablet (20 mg total) by mouth 3 (three) times daily as needed for spasms. 20 tablet 0  ? HYDROcodone-acetaminophen (NORCO/VICODIN) 5-325 MG tablet Take 1 tablet by mouth every 6 (six) hours as needed for moderate pain or severe pain. 20 tablet 0  ? methocarbamol (ROBAXIN) 500 MG tablet Take 1 tablet (500 mg total) by mouth every 6 (six) hours as needed for muscle spasms. 60 tablet 0  ? mirtazapine (REMERON) 30 MG tablet Take 30 mg by mouth at bedtime.     ? pregabalin (LYRICA) 75 MG capsule Take 75 mg by mouth 3 (three) times daily.    ? vitamin C (ASCORBIC ACID) 500 MG tablet Take 500 mg by mouth daily.    ? ?No current facility-administered medications for this visit.  ? ? ?Allergies as of 02/23/2022 - Review Complete 02/09/2022  ?Allergen Reaction Noted  ? Erythrocin Other (See Comments) 01/13/2012  ? Erythromycin Other (See Comments) 06/10/2021  ? Sulfa antibiotics Other (See Comments) 01/13/2012  ? Trazodone and nefazodone Other (See Comments) 06/18/2013  ? Tramadol Itching 05/15/2013  ? ? ?Family History  ?Problem Relation Age of Onset  ? Stroke Mother   ? Diabetes Father   ? Hypertension Father   ? Hyperlipidemia Father   ? Breast cancer Sister 19  ? ? ?Social History  ? ?Socioeconomic History  ? Marital status: Single  ?  Spouse name: Not on file  ? Number of children: 0  ? Years of education: 67  ? Highest education level: 12th grade  ?Occupational History  ? Occupation:  Disabled  ?Tobacco Use  ? Smoking status: Some Days  ?  Packs/day: 0.75  ?  Years: 45.00  ?  Pack years: 33.75  ?  Types: Cigarettes, Cigars  ? Smokeless tobacco: Never  ? Tobacco comments:  ?  smoking cig for 35 years. continues to smoke 1/2 pack daily  ?Vaping Use  ? Vaping Use: Not on file  ?Substance and Sexual Activity  ? Alcohol use: Not Currently  ?  Alcohol/week: 0.0 standard drinks  ?  Comment: ETOH abuse for 25 year, 1/2 gallon Liquor daily. admits 6 weeks course of drinking ETOH as her only intake in 2011. she states that she quits her drinnking since Feb 2012.  ? Drug use:  Not Currently  ?  Comment: pots before, clean since 1995  ? Sexual activity: Not Currently  ?Other Topics Concern  ? Not on file  ?Social History Narrative  ? She lives with his stepson in [place]. Moved from [place] 2 years ago. Both  Parents lives in [place]. 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 Currently working at the [employer]). Did not complete High School .   ?   ? She admits only 2-3 hours daily for 15 years. She does not sleep at nighttime. She would watch TV and read at night time. Always sleep during the day. She has seen a psychiatrist at [facility] and still goes to the group therapy on Mondays. She has an follow up appt with her Psychiatrist on 08/07/12.  ?   ? Right handed   ? Lives at home with mother   ? 2-3 cups per day Caffeine   ? ?Social Determinants of Health  ? ?Financial Resource Strain: Not on file  ?Food Insecurity: Not on file  ?Transportation Needs: Not on file  ?Physical Activity: Not on file  ?Stress: Not on file  ?Social Connections: Not on file  ?Intimate Partner Violence: Not on file  ? ? ?Review of Systems:    ?Constitutional: No weight loss, fever or chills ?Skin: No rash  ?Cardiovascular: No chest pain  ?Respiratory: No SOB ?Gastrointestinal: See HPI and otherwise negative ?Genitourinary: No dysuria  ?Neurological: No headache, dizziness or syncope ?Musculoskeletal: No  new muscle or joint pain ?Hematologic: No bleeding  ?Psychiatric: No history of depression or anxiety ? ? Physical Exam:  ?Vital signs: ?BP 128/70   Pulse 60   Ht '5\' 6"'$  (1.676 m)   Wt 155 lb (70.3 kg)   BMI 25.

## 2022-02-23 NOTE — Patient Instructions (Signed)
We have sent the following medications to your pharmacy for you to pick up at your convenience:  Omeprazole   Bentyl  ? ?If you are age 66 or older, your body mass index should be between 23-30. Your Body mass index is 25.02 kg/m?Marland Kitchen If this is out of the aforementioned range listed, please consider follow up with your Primary Care Provider. ? ?If you are age 73 or younger, your body mass index should be between 19-25. Your Body mass index is 25.02 kg/m?Marland Kitchen If this is out of the aformentioned range listed, please consider follow up with your Primary Care Provider.  ? ?________________________________________________________ ? ?The [facility] providers would like to encourage you to use [facility] to communicate with providers for non-urgent requests or questions.  Due to long hold times on the telephone, sending your provider a message by [facility] may be a faster and more efficient way to get a response.  Please allow 48 business hours for a response.  Please remember that this is for non-urgent requests.  ?_______________________________________________________  ? ?I appreciate the  opportunity to care for you ? ?Thank You  ? ?[facility]  ?

## 2022-02-25 NOTE — Progress Notes (Signed)
Addendum: Reviewed and agree with assessment and management plan. Kody Vigil M, MD  

## 2022-03-16 DIAGNOSIS — R110 Nausea: Secondary | ICD-10-CM | POA: Diagnosis not present

## 2022-03-16 DIAGNOSIS — Z720 Tobacco use: Secondary | ICD-10-CM | POA: Diagnosis not present

## 2022-03-16 DIAGNOSIS — R1084 Generalized abdominal pain: Secondary | ICD-10-CM | POA: Diagnosis not present

## 2022-06-21 DIAGNOSIS — Z1389 Encounter for screening for other disorder: Secondary | ICD-10-CM | POA: Diagnosis not present

## 2022-06-21 DIAGNOSIS — Z0000 Encounter for general adult medical examination without abnormal findings: Secondary | ICD-10-CM | POA: Diagnosis not present

## 2022-06-21 DIAGNOSIS — E559 Vitamin D deficiency, unspecified: Secondary | ICD-10-CM | POA: Diagnosis not present

## 2022-06-21 DIAGNOSIS — M810 Age-related osteoporosis without current pathological fracture: Secondary | ICD-10-CM | POA: Diagnosis not present

## 2022-06-21 DIAGNOSIS — G609 Hereditary and idiopathic neuropathy, unspecified: Secondary | ICD-10-CM | POA: Diagnosis not present

## 2022-06-21 DIAGNOSIS — F331 Major depressive disorder, recurrent, moderate: Secondary | ICD-10-CM | POA: Diagnosis not present

## 2022-06-21 DIAGNOSIS — E782 Mixed hyperlipidemia: Secondary | ICD-10-CM | POA: Diagnosis not present

## 2022-06-21 DIAGNOSIS — F411 Generalized anxiety disorder: Secondary | ICD-10-CM | POA: Diagnosis not present

## 2022-06-21 DIAGNOSIS — F4321 Adjustment disorder with depressed mood: Secondary | ICD-10-CM | POA: Diagnosis not present

## 2022-06-21 DIAGNOSIS — Z720 Tobacco use: Secondary | ICD-10-CM | POA: Diagnosis not present

## 2022-08-03 ENCOUNTER — Other Ambulatory Visit: Payer: Self-pay

## 2022-08-03 ENCOUNTER — Telehealth: Payer: Self-pay | Admitting: *Deleted

## 2022-08-03 ENCOUNTER — Encounter (HOSPITAL_COMMUNITY): Payer: Self-pay

## 2022-08-03 ENCOUNTER — Emergency Department (HOSPITAL_COMMUNITY): Payer: Medicare HMO

## 2022-08-03 ENCOUNTER — Emergency Department (HOSPITAL_COMMUNITY)
Admission: EM | Admit: 2022-08-03 | Discharge: 2022-08-03 | Disposition: A | Discharge status: Home / Self Care | Payer: Medicare HMO | Attending: Emergency Medicine | Admitting: Emergency Medicine

## 2022-08-03 DIAGNOSIS — K760 Fatty (change of) liver, not elsewhere classified: Secondary | ICD-10-CM | POA: Diagnosis not present

## 2022-08-03 DIAGNOSIS — I959 Hypotension, unspecified: Secondary | ICD-10-CM | POA: Diagnosis not present

## 2022-08-03 DIAGNOSIS — R000 Tachycardia, unspecified: Secondary | ICD-10-CM | POA: Diagnosis not present

## 2022-08-03 DIAGNOSIS — R1032 Left lower quadrant pain: Secondary | ICD-10-CM | POA: Diagnosis present

## 2022-08-03 DIAGNOSIS — Z20822 Contact with and (suspected) exposure to covid-19: Secondary | ICD-10-CM | POA: Diagnosis not present

## 2022-08-03 DIAGNOSIS — K529 Noninfective gastroenteritis and colitis, unspecified: Secondary | ICD-10-CM | POA: Diagnosis not present

## 2022-08-03 DIAGNOSIS — R1084 Generalized abdominal pain: Secondary | ICD-10-CM | POA: Diagnosis not present

## 2022-08-03 DIAGNOSIS — N200 Calculus of kidney: Secondary | ICD-10-CM | POA: Diagnosis not present

## 2022-08-03 DIAGNOSIS — R1030 Lower abdominal pain, unspecified: Secondary | ICD-10-CM | POA: Diagnosis not present

## 2022-08-03 DIAGNOSIS — N390 Urinary tract infection, site not specified: Secondary | ICD-10-CM | POA: Insufficient documentation

## 2022-08-03 DIAGNOSIS — N289 Disorder of kidney and ureter, unspecified: Secondary | ICD-10-CM | POA: Diagnosis not present

## 2022-08-03 LAB — CBC WITH DIFFERENTIAL/PLATELET
Abs Immature Granulocytes: 0.08 10*3/uL — ABNORMAL HIGH (ref 0.00–0.07)
Basophils Absolute: 0 10*3/uL (ref 0.0–0.1)
Basophils Relative: 0 %
Eosinophils Absolute: 0 10*3/uL (ref 0.0–0.5)
Eosinophils Relative: 1 %
HCT: 41.7 % (ref 36.0–46.0)
Hemoglobin: 14.6 g/dL (ref 12.0–15.0)
Immature Granulocytes: 1 %
Lymphocytes Relative: 34 %
Lymphs Abs: 2.6 10*3/uL (ref 0.7–4.0)
MCH: 34 pg (ref 26.0–34.0)
MCHC: 35 g/dL (ref 30.0–36.0)
MCV: 97.2 fL (ref 80.0–100.0)
Monocytes Absolute: 0.7 10*3/uL (ref 0.1–1.0)
Monocytes Relative: 9 %
Neutro Abs: 4.2 10*3/uL (ref 1.7–7.7)
Neutrophils Relative %: 55 %
Platelets: 156 10*3/uL (ref 150–400)
RBC: 4.29 MIL/uL (ref 3.87–5.11)
RDW: 15.5 % (ref 11.5–15.5)
WBC: 7.6 10*3/uL (ref 4.0–10.5)
nRBC: 0.9 % — ABNORMAL HIGH (ref 0.0–0.2)

## 2022-08-03 LAB — URINALYSIS, ROUTINE W REFLEX MICROSCOPIC
Bilirubin Urine: NEGATIVE
Glucose, UA: NEGATIVE mg/dL
Ketones, ur: 5 mg/dL — AB
Nitrite: POSITIVE — AB
Protein, ur: 30 mg/dL — AB
Specific Gravity, Urine: >1.046 — ABNORMAL HIGH (ref 1.005–1.030)
WBC, UA: >50 WBC/hpf — ABNORMAL HIGH (ref 0–5)
pH: 6 (ref 5.0–8.0)

## 2022-08-03 LAB — RESP PANEL BY RT-PCR (FLU A&B, COVID) ARPGX2
Influenza A by PCR: NEGATIVE
Influenza B by PCR: NEGATIVE
SARS Coronavirus 2 by RT PCR: NEGATIVE

## 2022-08-03 LAB — COMPREHENSIVE METABOLIC PANEL
ALT: 61 U/L — ABNORMAL HIGH (ref 0–44)
AST: 156 U/L — ABNORMAL HIGH (ref 15–41)
Albumin: 3.3 g/dL — ABNORMAL LOW (ref 3.5–5.0)
Alkaline Phosphatase: 118 U/L (ref 38–126)
Anion gap: 15 (ref 5–15)
BUN: 15 mg/dL (ref 8–23)
CO2: 23 mmol/L (ref 22–32)
Calcium: 9.7 mg/dL (ref 8.9–10.3)
Chloride: 103 mmol/L (ref 98–111)
Creatinine, Ser: 1.05 mg/dL — ABNORMAL HIGH (ref 0.44–1.00)
GFR, Estimated: 59 mL/min — ABNORMAL LOW (ref >60)
Glucose, Bld: 124 mg/dL — ABNORMAL HIGH (ref 70–99)
Potassium: 3.8 mmol/L (ref 3.5–5.1)
Sodium: 141 mmol/L (ref 135–145)
Total Bilirubin: 1.1 mg/dL (ref 0.3–1.2)
Total Protein: 6.7 g/dL (ref 6.5–8.1)

## 2022-08-03 LAB — LIPASE, BLOOD: Lipase: 25 U/L (ref 11–51)

## 2022-08-03 LAB — LACTIC ACID, PLASMA: Lactic Acid, Venous: 1.4 mmol/L (ref 0.5–1.9)

## 2022-08-03 MED ORDER — ONDANSETRON 4 MG PO TBDP: 4.0000 mg | ORAL_TABLET | Freq: Three times a day (TID) | ORAL | 0 refills | Status: DC | PRN

## 2022-08-03 MED ORDER — HYDROCODONE-ACETAMINOPHEN 5-325 MG PO TABS: 1.0000 | ORAL_TABLET | ORAL | 0 refills | Status: DC | PRN

## 2022-08-03 MED ORDER — ACETAMINOPHEN 500 MG PO TABS
1000.0000 mg | ORAL_TABLET | Freq: Once | ORAL | Status: AC
  Administered 2022-08-03: 1000 mg via ORAL
  Filled 2022-08-03: qty 2

## 2022-08-03 MED ORDER — CIPROFLOXACIN HCL 500 MG PO TABS: 500.0000 mg | ORAL_TABLET | Freq: Two times a day (BID) | ORAL | 0 refills | Status: AC

## 2022-08-03 MED ORDER — PIPERACILLIN-TAZOBACTAM 3.375 G IVPB: 3.3750 g | Freq: Three times a day (TID) | INTRAVENOUS | Status: DC

## 2022-08-03 MED ORDER — METRONIDAZOLE 500 MG PO TABS: 500.0000 mg | ORAL_TABLET | Freq: Two times a day (BID) | ORAL | 0 refills | Status: AC

## 2022-08-03 MED ORDER — PIPERACILLIN-TAZOBACTAM 3.375 G IVPB 30 MIN
3.3750 g | Freq: Once | INTRAVENOUS | Status: AC
  Administered 2022-08-03: 3.375 g via INTRAVENOUS
  Filled 2022-08-03: qty 50

## 2022-08-03 MED ORDER — ONDANSETRON HCL 4 MG/2ML IJ SOLN
4.0000 mg | Freq: Once | INTRAMUSCULAR | Status: AC
  Administered 2022-08-03: 4 mg via INTRAVENOUS
  Filled 2022-08-03: qty 2

## 2022-08-03 MED ORDER — LACTATED RINGERS IV BOLUS
1000.0000 mL | Freq: Once | INTRAVENOUS | Status: AC
  Administered 2022-08-03: 1000 mL via INTRAVENOUS

## 2022-08-03 MED ORDER — FENTANYL CITRATE PF 50 MCG/ML IJ SOSY
50.0000 ug | PREFILLED_SYRINGE | Freq: Once | INTRAMUSCULAR | Status: AC
  Administered 2022-08-03: 50 ug via INTRAVENOUS
  Filled 2022-08-03: qty 1

## 2022-08-03 MED ORDER — IOHEXOL 300 MG/ML  SOLN
100.0000 mL | Freq: Once | INTRAMUSCULAR | Status: AC | PRN
  Administered 2022-08-03: 80 mL via INTRAVENOUS

## 2022-08-03 NOTE — ED Triage Notes (Signed)
Patient arrived by [facility] with left sided abdominal pain that she thinks recurring after a spleen injury earlier in year. Reports ongoing diarrhea with same for 1 week.

## 2022-08-03 NOTE — ED Provider Triage Note (Signed)
Emergency Medicine Provider Triage Evaluation Note  Mahreen Schewe , a 66 y.o. female  was evaluated in triage.  Pt complains of abdominal pain.  Is left-sided flank and wraps around her abdomen in a bandlike distribution.  It is constant for about a week.  Associated with diarrhea.   History of splenic rupture and abdominal hysterectomy.  Review of Systems  Per HPI Physical Exam  BP 130/61 (BP Location: Right Arm)   Pulse (!) 111   Temp (!) 100.6 F (38.1 C) (Oral)   Resp 16   SpO2 90%  Gen:   Awake, no distress   Resp:  Normal effort  MSK:   Moves extremities without difficulty  Other:  Tachycardic, diffuse abdominal tenderness without localization.  Medical Decision Making  Medically screening exam initiated at 10:40 AM.  Appropriate orders placed.  [name] Maddix was informed that the remainder of the evaluation will be completed by another provider, this initial triage assessment does not replace that evaluation, and the importance of remaining in the ED until their evaluation is complete.  Not calling as a code sepsis, Tylenol ordered in triage.  Will check abdominal labs and add lactic and blood cultures as well as COVID.   Sherrill Raring, PA-C 08/03/22 1042

## 2022-08-03 NOTE — Progress Notes (Signed)
Pharmacy Antibiotic Note  Monique Perry is a 66 y.o. female admitted on 08/03/2022 with  intra-abdominal infection .  Pharmacy has been consulted for Zosyn dosing.  WBC wnl, afebrile, HR 108   Plan: Give Zosyn 3.375g IV x1 over 92mn, followed by  Zosyn 3.375g IV q8h (4 hour infusion). Monitor daily CBC, temp, SCr, and for clinical signs of improvement  F/u cultures and de-escalate antibiotics as able   Height: '5\' 6"'$  (167.6 cm) Weight: 63.5 kg (140 lb) IBW/kg (Calculated) : 59.3  Temp (24hrs), Avg:99.3 F (37.4 C), Min:98 F (36.7 C), Max:100.6 F (38.1 C)  Recent Labs  Lab 08/03/22 1053  WBC 7.6  CREATININE 1.05*  LATICACIDVEN 1.4    Estimated Creatinine Clearance: 49.3 mL/min (A) (by C-G formula based on SCr of 1.05 mg/dL (H)).    Allergies  Allergen Reactions   Erythrocin Other (See Comments)    Thrush   Erythromycin Other (See Comments)    thrush   Sulfa Antibiotics Other (See Comments)    Thrush    Trazodone And Nefazodone Other (See Comments)    Severe insomnia   Tramadol Itching    Antimicrobials this admission: Zosyn 8/22 >>   Dose adjustments this admission: N/A  Microbiology results: 8/22 BCx: collected  Thank you for allowing pharmacy to be a part of this patient's care.  CLuisa Hart PharmD, BCPS Clinical Pharmacist 08/03/2022 1:00 PM   [facility] to [facility]

## 2022-08-03 NOTE — ED Triage Notes (Signed)
Pt BIB [facility] from home c/o lower abdominal pain x1 week with diarrhea.

## 2022-08-03 NOTE — Telephone Encounter (Signed)
Left message for patient to call back to schedule follow up lung cancer screening CT scan at both home and mobile number.

## 2022-08-03 NOTE — ED Provider Notes (Signed)
[facility] EMERGENCY DEPARTMENT Provider Note   CSN: [id number] Arrival date & time: 08/03/22  0944     History  Chief Complaint  Patient presents with   Abdominal Pain    Monique Perry is a 66 y.o. female.  HPI     Has had constant left sided pain for about one week Cramping, sharp Black tarry stools for one week, at least 2-4 times a day No urinary symptoms, urgency, has had uti in past without symptoms No known fevers  Vomited yesterday once, nausea Continued cough    Past Medical History:  Diagnosis Date   Alcohol abuse last February 2012   Arthritis    Bipolar disorder ([place])    Collagenous colitis    Drug abuse ([place]) last 1990's   Peripheral neuropathy 11/29/2018   Pneumonia      Home Medications Prior to Admission medications   Medication Sig Start Date End Date Taking? Authorizing Provider  ciprofloxacin (CIPRO) 500 MG tablet Take 1 tablet (500 mg total) by mouth every 12 (twelve) hours for 10 days. 08/03/22 08/13/22 Yes Gareth Morgan, MD  HYDROcodone-acetaminophen (NORCO/VICODIN) 5-325 MG tablet Take 1 tablet by mouth every 4 (four) hours as needed. 08/03/22  Yes Gareth Morgan, MD  metroNIDAZOLE (FLAGYL) 500 MG tablet Take 1 tablet (500 mg total) by mouth 2 (two) times daily for 10 days. 08/03/22 08/13/22 Yes Gareth Morgan, MD  ondansetron (ZOFRAN-ODT) 4 MG disintegrating tablet Take 1 tablet (4 mg total) by mouth every 8 (eight) hours as needed for nausea or vomiting. 08/03/22  Yes Gareth Morgan, MD  ALPRAZolam Duanne Moron) 0.5 MG tablet Take 0.5 mg by mouth 2 (two) times daily as needed. 01/25/22   [provider]  dicyclomine (BENTYL) 20 MG tablet Take 1 tablet (20 mg total) by mouth every 6 (six) hours. 02/23/22   Levin Erp, PA  mirtazapine (REMERON) 30 MG tablet Take 30 mg by mouth at bedtime.  12/14/19   [provider]  omeprazole (PRILOSEC) 20 MG capsule Take 1 capsule (20 mg total) by mouth daily.  02/23/22   Levin Erp, PA  risedronate (ACTONEL) 35 MG tablet Take 35 mg by mouth once a week. 01/23/22   [provider]  rosuvastatin (CRESTOR) 5 MG tablet Take 5 mg by mouth daily. 01/29/22   [provider]      Allergies    Erythrocin, Erythromycin, Sulfa antibiotics, Trazodone and nefazodone, and Tramadol    Review of Systems   Review of Systems  Physical Exam Updated Vital Signs BP 120/61   Pulse 77   Temp 98.2 F (36.8 C) (Oral)   Resp 12   Ht '5\' 6"'$  (1.676 m)   Wt 63.5 kg   SpO2 90%   BMI 22.60 kg/m  Physical Exam Vitals and nursing note reviewed.  Constitutional:      General: She is not in acute distress.    Appearance: She is well-developed. She is not diaphoretic.  HENT:     Head: Normocephalic and atraumatic.  Eyes:     Conjunctiva/sclera: Conjunctivae normal.  Cardiovascular:     Rate and Rhythm: Normal rate and regular rhythm.     Heart sounds: Normal heart sounds. No murmur heard.    No friction rub. No gallop.  Pulmonary:     Effort: Pulmonary effort is normal. No respiratory distress.     Breath sounds: Normal breath sounds. No wheezing or rales.  Abdominal:     General: There is no distension.  Palpations: Abdomen is soft.     Tenderness: There is abdominal tenderness in the left lower quadrant. There is no guarding.  Musculoskeletal:        General: No tenderness.     Cervical back: Normal range of motion.  Skin:    General: Skin is warm and dry.     Findings: No erythema or rash.  Neurological:     Mental Status: She is alert and oriented to person, place, and time.     ED Results / Procedures / Treatments   Labs (all labs ordered are listed, but only abnormal results are displayed) Labs Reviewed  CBC WITH DIFFERENTIAL/PLATELET - Abnormal; Notable for the following components:      Result Value   nRBC 0.9 (*)    Abs Immature Granulocytes 0.08 (*)    All other components within normal limits   COMPREHENSIVE METABOLIC PANEL - Abnormal; Notable for the following components:   Glucose, Bld 124 (*)    Creatinine, Ser 1.05 (*)    Albumin 3.3 (*)    AST 156 (*)    ALT 61 (*)    GFR, Estimated 59 (*)    All other components within normal limits  URINALYSIS, ROUTINE W REFLEX MICROSCOPIC - Abnormal; Notable for the following components:   APPearance HAZY (*)    Specific Gravity, Urine >1.046 (*)    Hgb urine dipstick SMALL (*)    Ketones, ur 5 (*)    Protein, ur 30 (*)    Nitrite POSITIVE (*)    Leukocytes,Ua MODERATE (*)    WBC, UA >50 (*)    Bacteria, UA MANY (*)    All other components within normal limits  RESP PANEL BY RT-PCR (FLU A&B, COVID) ARPGX2  CULTURE, BLOOD (ROUTINE X 2)  CULTURE, BLOOD (ROUTINE X 2)  C DIFFICILE QUICK SCREEN W PCR REFLEX    URINE CULTURE  LIPASE, BLOOD  LACTIC ACID, PLASMA  BASIC METABOLIC PANEL  CBC    EKG EKG Interpretation  Date/Time:  Tuesday August 03 2022 12:12:37 EDT Ventricular Rate:  105 PR Interval:  112 QRS Duration: 82 QT Interval:  345 QTC Calculation: 456 R Axis:   59 Text Interpretation: Sinus tachycardia versus possible ectopic atrial rhythm Confirmed by Gareth Morgan [phone]) on 08/03/2022 4:15:16 PM  Radiology CT ABDOMEN PELVIS W CONTRAST  Result Date: 08/03/2022 CLINICAL DATA:  A 66 year old female presents for evaluation of LEFT lower quadrant abdominal pain. EXAM: CT ABDOMEN AND PELVIS WITH CONTRAST TECHNIQUE: Multidetector CT imaging of the abdomen and pelvis was performed using the standard protocol following bolus administration of intravenous contrast. RADIATION DOSE REDUCTION: This exam was performed according to the departmental dose-optimization program which includes automated exposure control, adjustment of the mA and/or kV according to patient size and/or use of iterative reconstruction technique. CONTRAST:  45m OMNIPAQUE IOHEXOL 300 MG/ML  SOLN COMPARISON:  February 09, 2022. FINDINGS: Lower chest:  Incidental imaging of the lung bases without signs of effusion or evidence of consolidative changes. Hepatobiliary: Severe hepatic steatosis. No focal, suspicious hepatic lesion with patent portal and hepatic venous systems. No pericholecystic stranding. Biliary tree approximately 7 mm in the extrahepatic biliary tree, common bile duct above the pancreas approximately 8 mm similar to imaging dating back to February of 2023. Pancreas: Calcifications of the pancreatic head are similar to prior imaging. No ductal dilation.  No sign of inflammation. Spleen: Cystic lesion of the spleen 3.2 x 3.1 cm previously 3.9 x 3.6 cm and with calcifications of septation  within this area. The spleen is otherwise normal in without acute findings. Post splenic artery embolization. Adrenals/Urinary Tract: Benign appearing RIGHT adrenal thickening is unchanged shown to be compatible with adrenal adenoma on prior imaging. No dedicated imaging follow-up of this finding is recommended. Nephrolithiasis in the lower pole the RIGHT kidney is stable calculus at 6 mm to 7 mm. Symmetric renal enhancement without hydronephrosis, suspicious renal lesion or substantial new perinephric stranding. Urinary bladder is collapsed. Stomach/Bowel: Small bowel without signs of inflammation or dilation. No inflammation adjacent to the stomach. Appendix is normal. Colon is collapsed. Query mural stratification of the rectum and sigmoid though without adjacent stranding. Vascular/Lymphatic: Aortic atherosclerosis. No sign of aneurysm. Smooth contour of the IVC. There is no gastrohepatic or hepatoduodenal ligament lymphadenopathy. No retroperitoneal or mesenteric lymphadenopathy. No pelvic sidewall lymphadenopathy. Atherosclerotic changes are moderate to marked and mainly calcified Reproductive: Post hysterectomy. Other: No ascites. Musculoskeletal: Post RIGHT SI joint fusion. Spinal degenerative changes. IMPRESSION: 1. Query mural stratification of the rectum  and sigmoid though without adjacent stranding. This may reflect symptoms of either mild/early proctocolitis or previous inflammation. Correlate with current symptoms and consider stool assessment particularly for C difficile. 2. Severe hepatic steatosis. 3. Post splenic artery embolization. 4. Benign RIGHT adrenal adenoma. No additional dedicated follow-up imaging is recommended for this finding. 5. Nephrolithiasis in the lower pole the RIGHT kidney is stable calculus at 6 mm to 7 mm. 6. Cystic lesion of the spleen compatible with sequela of prior trauma/hematoma showing decreased size over time and with developing calcifications following remote splenic injury and subsequent splenic artery embolization. 7. Aortic atherosclerosis. Aortic Atherosclerosis (ICD10-I70.0). Electronically Signed   By: Zetta Bills M.D.   On: 08/03/2022 13:56    Procedures Procedures    Medications Ordered in ED Medications  piperacillin-tazobactam (ZOSYN) IVPB 3.375 g (has no administration in time range)  acetaminophen (TYLENOL) tablet 1,000 mg (1,000 mg Oral Given 08/03/22 1144)  lactated ringers bolus 1,000 mL (0 mLs Intravenous Stopped 08/03/22 1610)  ondansetron (ZOFRAN) injection 4 mg (4 mg Intravenous Given 08/03/22 1300)  fentaNYL (SUBLIMAZE) injection 50 mcg (50 mcg Intravenous Given 08/03/22 1301)  piperacillin-tazobactam (ZOSYN) IVPB 3.375 g (0 g Intravenous Stopped 08/03/22 1338)  iohexol (OMNIPAQUE) 300 MG/ML solution 100 mL (80 mLs Intravenous Contrast Given 08/03/22 1327)    ED Course/ Medical Decision Making/ A&P                           Medical Decision Making Amount and/or Complexity of Data Reviewed Labs: ordered. Radiology: ordered.  Risk Prescription drug management.   66yo female with history of bipolar, collagenous colitis, presents with concern for left lower quadrant abdominal pain and diarrhea.   DDx includes appendicitis, pancreatitis, cholecystitis, pyelonephritis, nephrolithiasis,  diverticulitis, SBO.  Mild transaminitis with normal biliribuin, normal lipase, not having RUQ abdominal pain.   CT abdomen pelvis with mild/early proctocolitis or previous inflammation without other acute findings.  UA with concern for UTI.  Given rx for cipro/flagyl for treatment of UTI, proctocolitis.  Reviewed in [place] drug database, given rx for percocet , recommend ibuprofen, GI follow up. Patient discharged in stable condition with understanding of reasons to return.          Final Clinical Impression(s) / ED Diagnoses Final diagnoses:  Proctocolitis  Urinary tract infection without hematuria, site unspecified    Rx / DC Orders ED Discharge Orders          Ordered  ciprofloxacin (CIPRO) 500 MG tablet  Every 12 hours        08/03/22 1641    metroNIDAZOLE (FLAGYL) 500 MG tablet  2 times daily        08/03/22 1641    HYDROcodone-acetaminophen (NORCO/VICODIN) 5-325 MG tablet  Every 4 hours PRN        08/03/22 1642    ondansetron (ZOFRAN-ODT) 4 MG disintegrating tablet  Every 8 hours PRN        08/03/22 1643              Gareth Morgan, MD 08/03/22 2211

## 2022-08-06 LAB — URINE CULTURE: Culture: 20000 — AB

## 2022-08-07 ENCOUNTER — Telehealth: Payer: Self-pay | Admitting: *Deleted

## 2022-08-07 NOTE — Telephone Encounter (Signed)
Post ED Visit - Positive Culture Follow-up  Culture report reviewed by antimicrobial stewardship pharmacist: [place] Team '[]'$  Elenor Quinones, Pharm.D. '[]'$  Heide Guile, Pharm.D., BCPS AQ-ID '[]'$  Parks Neptune, Pharm.D., BCPS '[]'$  Alycia Rossetti, Pharm.D., BCPS '[]'$  [place], Pharm.D., BCPS, AAHIVP '[]'$  Legrand Como, Pharm.D., BCPS, AAHIVP '[]'$  Salome Arnt, PharmD, BCPS '[]'$  Johnnette Gourd, PharmD, BCPS '[]'$  Hughes Better, PharmD, BCPS '[]'$  Leeroy Cha, PharmD '[]'$  Laqueta Linden, PharmD, BCPS '[]'$  Albertina Parr, PharmD  [place] Team '[]'$  Leodis Sias, PharmD '[]'$  Lindell Spar, PharmD '[]'$  Royetta Asal, PharmD '[]'$  Graylin Shiver, Rph '[]'$  Rema Fendt) Glennon Mac, PharmD '[]'$  Arlyn Dunning, PharmD '[]'$  Netta Cedars, PharmD '[]'$  Dia Sitter, PharmD '[]'$  Leone Haven, PharmD '[]'$  Gretta Arab, PharmD '[]'$  Theodis Shove, PharmD '[]'$  Peggyann Juba, PharmD '[]'$  Reuel Boom, PharmD   Positive urine culture Treated with Ciprofloxacin HCL, organism sensitive to the same and no further patient follow-up is required at this time.  Asencion Gowda, PharmD  Harlon Flor Talley 08/07/2022, 12:56 PM

## 2022-08-08 LAB — CULTURE, BLOOD (ROUTINE X 2)
Culture: NO GROWTH
Culture: NO GROWTH
Special Requests: ADEQUATE

## 2022-08-13 DIAGNOSIS — F17210 Nicotine dependence, cigarettes, uncomplicated: Secondary | ICD-10-CM | POA: Diagnosis not present

## 2022-08-13 DIAGNOSIS — F319 Bipolar disorder, unspecified: Secondary | ICD-10-CM | POA: Diagnosis not present

## 2022-08-13 DIAGNOSIS — Z87440 Personal history of urinary (tract) infections: Secondary | ICD-10-CM | POA: Diagnosis not present

## 2022-08-13 DIAGNOSIS — K52831 Collagenous colitis: Secondary | ICD-10-CM | POA: Diagnosis not present

## 2022-08-13 DIAGNOSIS — R110 Nausea: Secondary | ICD-10-CM | POA: Diagnosis not present

## 2022-08-13 DIAGNOSIS — Z6822 Body mass index (BMI) 22.0-22.9, adult: Secondary | ICD-10-CM | POA: Diagnosis not present

## 2022-08-13 DIAGNOSIS — K529 Noninfective gastroenteritis and colitis, unspecified: Secondary | ICD-10-CM | POA: Diagnosis not present

## 2022-09-03 ENCOUNTER — Other Ambulatory Visit: Payer: Self-pay

## 2022-09-03 MED ORDER — OMEPRAZOLE 20 MG PO CPDR: 20.0000 mg | DELAYED_RELEASE_CAPSULE | Freq: Every day | ORAL | 1 refills | Status: DC

## 2022-09-27 ENCOUNTER — Telehealth: Payer: Self-pay | Admitting: *Deleted

## 2022-09-27 NOTE — Telephone Encounter (Signed)
Called and spoke with patient about setting up lung caner screening CT. PT states she is going through other medical issues right now and would like a call back in another month. Will call back.

## 2022-10-05 DIAGNOSIS — H43811 Vitreous degeneration, right eye: Secondary | ICD-10-CM | POA: Diagnosis not present

## 2022-10-05 DIAGNOSIS — H524 Presbyopia: Secondary | ICD-10-CM | POA: Diagnosis not present

## 2022-10-05 DIAGNOSIS — H538 Other visual disturbances: Secondary | ICD-10-CM | POA: Diagnosis not present

## 2022-10-12 DIAGNOSIS — W19XXXA Unspecified fall, initial encounter: Secondary | ICD-10-CM | POA: Diagnosis not present

## 2022-10-12 DIAGNOSIS — M13861 Other specified arthritis, right knee: Secondary | ICD-10-CM | POA: Diagnosis not present

## 2022-10-12 DIAGNOSIS — S83206A Unspecified tear of unspecified meniscus, current injury, right knee, initial encounter: Secondary | ICD-10-CM | POA: Diagnosis not present

## 2022-10-12 DIAGNOSIS — S83207A Unspecified tear of unspecified meniscus, current injury, left knee, initial encounter: Secondary | ICD-10-CM | POA: Diagnosis not present

## 2022-10-12 DIAGNOSIS — M13862 Other specified arthritis, left knee: Secondary | ICD-10-CM | POA: Diagnosis not present

## 2022-10-20 DIAGNOSIS — M25561 Pain in right knee: Secondary | ICD-10-CM | POA: Diagnosis not present

## 2022-10-20 DIAGNOSIS — M25562 Pain in left knee: Secondary | ICD-10-CM | POA: Diagnosis not present

## 2022-10-25 DIAGNOSIS — M810 Age-related osteoporosis without current pathological fracture: Secondary | ICD-10-CM | POA: Diagnosis not present

## 2022-10-25 DIAGNOSIS — E781 Pure hyperglyceridemia: Secondary | ICD-10-CM | POA: Diagnosis not present

## 2022-10-25 DIAGNOSIS — F331 Major depressive disorder, recurrent, moderate: Secondary | ICD-10-CM | POA: Diagnosis not present

## 2022-10-25 DIAGNOSIS — G4700 Insomnia, unspecified: Secondary | ICD-10-CM | POA: Diagnosis not present

## 2022-10-28 ENCOUNTER — Encounter: Payer: Self-pay | Admitting: *Deleted

## 2022-11-03 ENCOUNTER — Other Ambulatory Visit: Payer: Self-pay

## 2022-11-03 DIAGNOSIS — Z122 Encounter for screening for malignant neoplasm of respiratory organs: Secondary | ICD-10-CM

## 2022-11-03 DIAGNOSIS — Z87891 Personal history of nicotine dependence: Secondary | ICD-10-CM

## 2022-11-03 DIAGNOSIS — F17210 Nicotine dependence, cigarettes, uncomplicated: Secondary | ICD-10-CM

## 2022-12-01 DIAGNOSIS — F1021 Alcohol dependence, in remission: Secondary | ICD-10-CM | POA: Diagnosis not present

## 2022-12-01 DIAGNOSIS — Z79899 Other long term (current) drug therapy: Secondary | ICD-10-CM | POA: Diagnosis not present

## 2022-12-01 DIAGNOSIS — E785 Hyperlipidemia, unspecified: Secondary | ICD-10-CM | POA: Diagnosis not present

## 2022-12-01 DIAGNOSIS — E559 Vitamin D deficiency, unspecified: Secondary | ICD-10-CM | POA: Diagnosis not present

## 2022-12-01 DIAGNOSIS — I10 Essential (primary) hypertension: Secondary | ICD-10-CM | POA: Diagnosis not present

## 2022-12-01 DIAGNOSIS — Z1159 Encounter for screening for other viral diseases: Secondary | ICD-10-CM | POA: Diagnosis not present

## 2022-12-01 DIAGNOSIS — I70219 Atherosclerosis of native arteries of extremities with intermittent claudication, unspecified extremity: Secondary | ICD-10-CM | POA: Diagnosis not present

## 2022-12-01 DIAGNOSIS — F17200 Nicotine dependence, unspecified, uncomplicated: Secondary | ICD-10-CM | POA: Diagnosis not present

## 2022-12-01 DIAGNOSIS — K219 Gastro-esophageal reflux disease without esophagitis: Secondary | ICD-10-CM | POA: Diagnosis not present

## 2022-12-02 DIAGNOSIS — E782 Mixed hyperlipidemia: Secondary | ICD-10-CM | POA: Diagnosis not present

## 2022-12-02 DIAGNOSIS — F419 Anxiety disorder, unspecified: Secondary | ICD-10-CM | POA: Diagnosis not present

## 2022-12-02 DIAGNOSIS — G4700 Insomnia, unspecified: Secondary | ICD-10-CM | POA: Diagnosis not present

## 2022-12-02 DIAGNOSIS — M810 Age-related osteoporosis without current pathological fracture: Secondary | ICD-10-CM | POA: Diagnosis not present

## 2022-12-02 DIAGNOSIS — F331 Major depressive disorder, recurrent, moderate: Secondary | ICD-10-CM | POA: Diagnosis not present

## 2022-12-08 ENCOUNTER — Inpatient Hospital Stay: Admission: RE | Admit: 2022-12-08 | Payer: Medicare HMO | Source: Ambulatory Visit

## 2023-01-13 ENCOUNTER — Other Ambulatory Visit: Payer: Self-pay | Admitting: Nurse Practitioner

## 2023-01-13 DIAGNOSIS — R5381 Other malaise: Secondary | ICD-10-CM

## 2023-01-13 DIAGNOSIS — Z1231 Encounter for screening mammogram for malignant neoplasm of breast: Secondary | ICD-10-CM

## 2023-01-13 DIAGNOSIS — E2839 Other primary ovarian failure: Secondary | ICD-10-CM

## 2023-02-02 ENCOUNTER — Ambulatory Visit
Admission: RE | Admit: 2023-02-02 | Discharge: 2023-02-02 | Disposition: A | Discharge status: Home / Self Care | Payer: Medicare HMO | Source: Ambulatory Visit | Attending: Acute Care | Admitting: Acute Care

## 2023-02-02 DIAGNOSIS — F17210 Nicotine dependence, cigarettes, uncomplicated: Secondary | ICD-10-CM

## 2023-02-02 DIAGNOSIS — Z122 Encounter for screening for malignant neoplasm of respiratory organs: Secondary | ICD-10-CM

## 2023-02-02 DIAGNOSIS — Z87891 Personal history of nicotine dependence: Secondary | ICD-10-CM

## 2023-02-03 ENCOUNTER — Other Ambulatory Visit: Payer: Self-pay

## 2023-02-03 DIAGNOSIS — Z122 Encounter for screening for malignant neoplasm of respiratory organs: Secondary | ICD-10-CM

## 2023-02-03 DIAGNOSIS — Z87891 Personal history of nicotine dependence: Secondary | ICD-10-CM

## 2023-02-03 DIAGNOSIS — F17210 Nicotine dependence, cigarettes, uncomplicated: Secondary | ICD-10-CM

## 2023-02-17 LAB — COLOGUARD

## 2023-03-06 ENCOUNTER — Emergency Department (HOSPITAL_COMMUNITY): Payer: Medicare HMO

## 2023-03-06 ENCOUNTER — Other Ambulatory Visit: Payer: Self-pay

## 2023-03-06 ENCOUNTER — Encounter (HOSPITAL_COMMUNITY): Payer: Self-pay

## 2023-03-06 ENCOUNTER — Observation Stay (HOSPITAL_COMMUNITY): Payer: Medicare HMO

## 2023-03-06 ENCOUNTER — Observation Stay (HOSPITAL_COMMUNITY)
Admission: EM | Admit: 2023-03-06 | Discharge: 2023-03-07 | Disposition: A | Discharge status: Home Health | Payer: Medicare HMO | Attending: Family Medicine | Admitting: Family Medicine

## 2023-03-06 DIAGNOSIS — Z79899 Other long term (current) drug therapy: Secondary | ICD-10-CM | POA: Diagnosis not present

## 2023-03-06 DIAGNOSIS — J9601 Acute respiratory failure with hypoxia: Secondary | ICD-10-CM | POA: Diagnosis not present

## 2023-03-06 DIAGNOSIS — R0789 Other chest pain: Secondary | ICD-10-CM | POA: Diagnosis present

## 2023-03-06 DIAGNOSIS — R079 Chest pain, unspecified: Secondary | ICD-10-CM | POA: Insufficient documentation

## 2023-03-06 DIAGNOSIS — F17210 Nicotine dependence, cigarettes, uncomplicated: Secondary | ICD-10-CM | POA: Diagnosis not present

## 2023-03-06 DIAGNOSIS — R1013 Epigastric pain: Secondary | ICD-10-CM | POA: Insufficient documentation

## 2023-03-06 DIAGNOSIS — M940 Chondrocostal junction syndrome [Tietze]: Secondary | ICD-10-CM | POA: Insufficient documentation

## 2023-03-06 DIAGNOSIS — W19XXXA Unspecified fall, initial encounter: Secondary | ICD-10-CM | POA: Insufficient documentation

## 2023-03-06 DIAGNOSIS — K219 Gastro-esophageal reflux disease without esophagitis: Secondary | ICD-10-CM | POA: Diagnosis present

## 2023-03-06 DIAGNOSIS — R7401 Elevation of levels of liver transaminase levels: Secondary | ICD-10-CM | POA: Diagnosis not present

## 2023-03-06 DIAGNOSIS — E876 Hypokalemia: Secondary | ICD-10-CM | POA: Diagnosis not present

## 2023-03-06 DIAGNOSIS — Z720 Tobacco use: Secondary | ICD-10-CM | POA: Diagnosis present

## 2023-03-06 DIAGNOSIS — G8929 Other chronic pain: Secondary | ICD-10-CM | POA: Insufficient documentation

## 2023-03-06 DIAGNOSIS — M8580 Other specified disorders of bone density and structure, unspecified site: Secondary | ICD-10-CM | POA: Diagnosis present

## 2023-03-06 DIAGNOSIS — R296 Repeated falls: Secondary | ICD-10-CM

## 2023-03-06 DIAGNOSIS — S20212A Contusion of left front wall of thorax, initial encounter: Secondary | ICD-10-CM | POA: Insufficient documentation

## 2023-03-06 DIAGNOSIS — F1010 Alcohol abuse, uncomplicated: Secondary | ICD-10-CM | POA: Diagnosis present

## 2023-03-06 DIAGNOSIS — S0083XA Contusion of other part of head, initial encounter: Secondary | ICD-10-CM | POA: Diagnosis not present

## 2023-03-06 DIAGNOSIS — G629 Polyneuropathy, unspecified: Secondary | ICD-10-CM

## 2023-03-06 DIAGNOSIS — E785 Hyperlipidemia, unspecified: Secondary | ICD-10-CM | POA: Diagnosis present

## 2023-03-06 DIAGNOSIS — R0902 Hypoxemia: Secondary | ICD-10-CM

## 2023-03-06 DIAGNOSIS — F319 Bipolar disorder, unspecified: Secondary | ICD-10-CM | POA: Diagnosis present

## 2023-03-06 LAB — FOLATE: Folate: 31.7 ng/mL (ref >5.9)

## 2023-03-06 LAB — CBC WITH DIFFERENTIAL/PLATELET
Abs Immature Granulocytes: 0.04 10*3/uL (ref 0.00–0.07)
Basophils Absolute: 0 10*3/uL (ref 0.0–0.1)
Basophils Relative: 0 %
Eosinophils Absolute: 0.3 10*3/uL (ref 0.0–0.5)
Eosinophils Relative: 4 %
HCT: 37.6 % (ref 36.0–46.0)
Hemoglobin: 12.9 g/dL (ref 12.0–15.0)
Immature Granulocytes: 1 %
Lymphocytes Relative: 46 %
Lymphs Abs: 3.4 10*3/uL (ref 0.7–4.0)
MCH: 37.7 pg — ABNORMAL HIGH (ref 26.0–34.0)
MCHC: 34.3 g/dL (ref 30.0–36.0)
MCV: 109.9 fL — ABNORMAL HIGH (ref 80.0–100.0)
Monocytes Absolute: 0.6 10*3/uL (ref 0.1–1.0)
Monocytes Relative: 8 %
Neutro Abs: 2.9 10*3/uL (ref 1.7–7.7)
Neutrophils Relative %: 41 %
Platelets: 123 10*3/uL — ABNORMAL LOW (ref 150–400)
RBC: 3.42 MIL/uL — ABNORMAL LOW (ref 3.87–5.11)
RDW: 14.3 % (ref 11.5–15.5)
WBC: 7.2 10*3/uL (ref 4.0–10.5)
nRBC: 0.7 % — ABNORMAL HIGH (ref 0.0–0.2)

## 2023-03-06 LAB — COMPREHENSIVE METABOLIC PANEL
ALT: 64 U/L — ABNORMAL HIGH (ref 0–44)
AST: 56 U/L — ABNORMAL HIGH (ref 15–41)
Albumin: 3.4 g/dL — ABNORMAL LOW (ref 3.5–5.0)
Alkaline Phosphatase: 86 U/L (ref 38–126)
Anion gap: 17 — ABNORMAL HIGH (ref 5–15)
BUN: 17 mg/dL (ref 8–23)
CO2: 22 mmol/L (ref 22–32)
Calcium: 8.6 mg/dL — ABNORMAL LOW (ref 8.9–10.3)
Chloride: 101 mmol/L (ref 98–111)
Creatinine, Ser: 1.19 mg/dL — ABNORMAL HIGH (ref 0.44–1.00)
GFR, Estimated: 50 mL/min — ABNORMAL LOW (ref >60)
Glucose, Bld: 98 mg/dL (ref 70–99)
Potassium: 3.3 mmol/L — ABNORMAL LOW (ref 3.5–5.1)
Sodium: 140 mmol/L (ref 135–145)
Total Bilirubin: 0.9 mg/dL (ref 0.3–1.2)
Total Protein: 6.2 g/dL — ABNORMAL LOW (ref 6.5–8.1)

## 2023-03-06 LAB — TROPONIN I (HIGH SENSITIVITY)
Troponin I (High Sensitivity): 9 ng/L (ref <18)
Troponin I (High Sensitivity): 9 ng/L (ref <18)

## 2023-03-06 LAB — LIPASE, BLOOD: Lipase: 64 U/L — ABNORMAL HIGH (ref 11–51)

## 2023-03-06 LAB — HEPATITIS PANEL, ACUTE
HCV Ab: NONREACTIVE
Hep A IgM: NONREACTIVE
Hep B C IgM: NONREACTIVE
Hepatitis B Surface Ag: NONREACTIVE

## 2023-03-06 LAB — ETHANOL: Alcohol, Ethyl (B): 108 mg/dL — ABNORMAL HIGH (ref <10)

## 2023-03-06 LAB — BRAIN NATRIURETIC PEPTIDE: B Natriuretic Peptide: 242.7 pg/mL — ABNORMAL HIGH (ref 0.0–100.0)

## 2023-03-06 LAB — HIV ANTIBODY (ROUTINE TESTING W REFLEX): HIV Screen 4th Generation wRfx: NONREACTIVE

## 2023-03-06 LAB — TSH: TSH: 2.258 u[IU]/mL (ref 0.350–4.500)

## 2023-03-06 LAB — MAGNESIUM: Magnesium: 1.3 mg/dL — ABNORMAL LOW (ref 1.7–2.4)

## 2023-03-06 LAB — VITAMIN B12: Vitamin B-12: 1035 pg/mL — ABNORMAL HIGH (ref 180–914)

## 2023-03-06 MED ORDER — ALBUTEROL SULFATE (2.5 MG/3ML) 0.083% IN NEBU: 2.5000 mg | INHALATION_SOLUTION | RESPIRATORY_TRACT | Status: DC | PRN

## 2023-03-06 MED ORDER — ACETAMINOPHEN 650 MG RE SUPP: 650.0000 mg | Freq: Four times a day (QID) | RECTAL | Status: DC | PRN

## 2023-03-06 MED ORDER — PREDNISONE 20 MG PO TABS
40.0000 mg | ORAL_TABLET | Freq: Every day | ORAL | Status: DC
  Administered 2023-03-07: 40 mg via ORAL
  Filled 2023-03-06: qty 2

## 2023-03-06 MED ORDER — IPRATROPIUM-ALBUTEROL 0.5-2.5 (3) MG/3ML IN SOLN
3.0000 mL | Freq: Once | RESPIRATORY_TRACT | Status: AC
  Administered 2023-03-06: 3 mL via RESPIRATORY_TRACT
  Filled 2023-03-06: qty 3

## 2023-03-06 MED ORDER — DICYCLOMINE HCL 20 MG PO TABS: 20.0000 mg | ORAL_TABLET | Freq: Four times a day (QID) | ORAL | Status: DC | PRN

## 2023-03-06 MED ORDER — ACETAMINOPHEN 500 MG PO TABS
1000.0000 mg | ORAL_TABLET | Freq: Once | ORAL | Status: AC
  Administered 2023-03-06: 1000 mg via ORAL
  Filled 2023-03-06: qty 2

## 2023-03-06 MED ORDER — MAGNESIUM SULFATE 2 GM/50ML IV SOLN
2.0000 g | Freq: Once | INTRAVENOUS | Status: AC
  Administered 2023-03-06: 2 g via INTRAVENOUS
  Filled 2023-03-06: qty 50

## 2023-03-06 MED ORDER — ALPRAZOLAM 0.5 MG PO TABS
0.5000 mg | ORAL_TABLET | Freq: Two times a day (BID) | ORAL | Status: DC | PRN
  Administered 2023-03-06 – 2023-03-07 (×2): 0.5 mg via ORAL
  Filled 2023-03-06 (×2): qty 1

## 2023-03-06 MED ORDER — ONDANSETRON HCL 4 MG PO TABS: 4.0000 mg | ORAL_TABLET | Freq: Four times a day (QID) | ORAL | Status: DC | PRN

## 2023-03-06 MED ORDER — SODIUM CHLORIDE 0.9 % IV SOLN: INTRAVENOUS | Status: AC

## 2023-03-06 MED ORDER — ADULT MULTIVITAMIN W/MINERALS CH
1.0000 | ORAL_TABLET | Freq: Every day | ORAL | Status: DC
  Administered 2023-03-06 – 2023-03-07 (×2): 1 via ORAL
  Filled 2023-03-06 (×2): qty 1

## 2023-03-06 MED ORDER — ONDANSETRON HCL 4 MG/2ML IJ SOLN: 4.0000 mg | Freq: Four times a day (QID) | INTRAMUSCULAR | Status: DC | PRN

## 2023-03-06 MED ORDER — KETOROLAC TROMETHAMINE 30 MG/ML IJ SOLN
30.0000 mg | Freq: Once | INTRAMUSCULAR | Status: AC
  Administered 2023-03-06: 30 mg via INTRAVENOUS
  Filled 2023-03-06: qty 1

## 2023-03-06 MED ORDER — ROSUVASTATIN CALCIUM 5 MG PO TABS
5.0000 mg | ORAL_TABLET | Freq: Every day | ORAL | Status: DC
  Administered 2023-03-06 – 2023-03-07 (×2): 5 mg via ORAL
  Filled 2023-03-06 (×2): qty 1

## 2023-03-06 MED ORDER — PREDNISONE 20 MG PO TABS
60.0000 mg | ORAL_TABLET | Freq: Once | ORAL | Status: AC
  Administered 2023-03-06: 60 mg via ORAL
  Filled 2023-03-06: qty 3

## 2023-03-06 MED ORDER — RISEDRONATE SODIUM 5 MG PO TABS: 35.0000 mg | ORAL_TABLET | ORAL | Status: DC

## 2023-03-06 MED ORDER — METOPROLOL SUCCINATE ER 25 MG PO TB24
25.0000 mg | ORAL_TABLET | Freq: Every day | ORAL | Status: DC
  Administered 2023-03-06 – 2023-03-07 (×2): 25 mg via ORAL
  Filled 2023-03-06 (×2): qty 1

## 2023-03-06 MED ORDER — LORAZEPAM 1 MG PO TABS
1.0000 mg | ORAL_TABLET | ORAL | Status: DC | PRN
  Administered 2023-03-07: 2 mg via ORAL
  Filled 2023-03-06: qty 2

## 2023-03-06 MED ORDER — THIAMINE HCL 100 MG/ML IJ SOLN: 100.0000 mg | Freq: Every day | INTRAMUSCULAR | Status: DC

## 2023-03-06 MED ORDER — ACETAMINOPHEN 325 MG PO TABS
650.0000 mg | ORAL_TABLET | Freq: Four times a day (QID) | ORAL | Status: DC | PRN
  Administered 2023-03-07: 650 mg via ORAL
  Filled 2023-03-06: qty 2

## 2023-03-06 MED ORDER — ENOXAPARIN SODIUM 40 MG/0.4ML IJ SOSY
40.0000 mg | PREFILLED_SYRINGE | INTRAMUSCULAR | Status: DC
  Administered 2023-03-06: 40 mg via SUBCUTANEOUS
  Filled 2023-03-06: qty 0.4

## 2023-03-06 MED ORDER — IOHEXOL 350 MG/ML SOLN
75.0000 mL | Freq: Once | INTRAVENOUS | Status: AC | PRN
  Administered 2023-03-06: 75 mL via INTRAVENOUS

## 2023-03-06 MED ORDER — THIAMINE MONONITRATE 100 MG PO TABS
100.0000 mg | ORAL_TABLET | Freq: Every day | ORAL | Status: DC
  Administered 2023-03-06 – 2023-03-07 (×2): 100 mg via ORAL
  Filled 2023-03-06 (×2): qty 1

## 2023-03-06 MED ORDER — PANTOPRAZOLE SODIUM 40 MG PO TBEC
40.0000 mg | DELAYED_RELEASE_TABLET | Freq: Every day | ORAL | Status: DC
  Administered 2023-03-06 – 2023-03-07 (×2): 40 mg via ORAL
  Filled 2023-03-06 (×2): qty 1

## 2023-03-06 MED ORDER — MIRTAZAPINE 15 MG PO TABS
30.0000 mg | ORAL_TABLET | Freq: Every day | ORAL | Status: DC
  Administered 2023-03-06: 30 mg via ORAL
  Filled 2023-03-06: qty 2

## 2023-03-06 MED ORDER — FOLIC ACID 1 MG PO TABS
1.0000 mg | ORAL_TABLET | Freq: Every day | ORAL | Status: DC
  Administered 2023-03-06 – 2023-03-07 (×2): 1 mg via ORAL
  Filled 2023-03-06 (×2): qty 1

## 2023-03-06 MED ORDER — IPRATROPIUM-ALBUTEROL 0.5-2.5 (3) MG/3ML IN SOLN
3.0000 mL | Freq: Four times a day (QID) | RESPIRATORY_TRACT | Status: AC
  Administered 2023-03-06 – 2023-03-07 (×3): 3 mL via RESPIRATORY_TRACT
  Filled 2023-03-06 (×3): qty 3

## 2023-03-06 MED ORDER — LORAZEPAM 0.5 MG PO TABS: 0.5000 mg | ORAL_TABLET | ORAL | Status: DC | PRN

## 2023-03-06 MED ORDER — POTASSIUM CHLORIDE CRYS ER 20 MEQ PO TBCR
40.0000 meq | EXTENDED_RELEASE_TABLET | Freq: Once | ORAL | Status: AC
  Administered 2023-03-06: 40 meq via ORAL
  Filled 2023-03-06: qty 2

## 2023-03-06 NOTE — ED Notes (Signed)
Pt's O2 increased to 3L at this time

## 2023-03-06 NOTE — Assessment & Plan Note (Signed)
Due for her weekly tablet today and will give this today

## 2023-03-06 NOTE — Assessment & Plan Note (Addendum)
At baseline, likely from alcohol Check hepatitis panel  CT abdomen with no acute liver changes

## 2023-03-06 NOTE — Assessment & Plan Note (Signed)
Check magnesium Replete and trend  

## 2023-03-06 NOTE — Assessment & Plan Note (Signed)
Continue crestor 5 mg daily 

## 2023-03-06 NOTE — ED Provider Notes (Signed)
[facility] Provider Note  CSN: [id number] Arrival date & time: 03/06/23 [id number]  Chief Complaint(s) Chest Pain and Abdominal Pain  HPI Monique Perry is a 67 y.o. female history of bipolar disorder, alcohol use disorder, peripheral artery disease presenting to the emergency department chest pain.  Patient reports that she has had chest pain for the past 2 to 3 days.  She reports that she has had frequent falls, does not really remember them though.  She reports 1 episode of vomiting, no nausea currently.  No pain in her arms or legs, no pain in her abdomen.  She does not remember hitting her head but notes a bruise on her forehead.  Reports alcohol use.  No shortness of breath.  No abdominal pain.  No neck pain.  Denies headache.   Past Medical History Past Medical History:  Diagnosis Date   Alcohol abuse last February 2012   Arthritis    Bipolar disorder ([place])    Collagenous colitis    Drug abuse ([place]) last 1990's   Peripheral neuropathy 11/29/2018   Pneumonia    Patient Active Problem List   Diagnosis Date Noted   Hypokalemia 03/06/2023   Acute respiratory failure with hypoxia ([place]) 03/06/2023   Tibia fracture 06/17/2020   Peripheral neuropathy 11/29/2018   Hypercalcemia 01/18/2017   Splenic rupture 07/15/2016   Serrated adenoma of cecum s/p polypectomy 2011 07/15/2016   Neck pain on left side 11/02/2014   Elevated transaminase level 02/14/2014   Anemia 11/14/2013   History of alcoholism ([place]) 11/12/2013   Hepatitis B vaccination administered at current visit 11/12/2013   Knee pain 06/18/2013   Right foot pain s/p fusion surgery of right foot 05/10/2013   Fracture of metatarsal of right foot, closed 03/20/2013   Abnormality of gait 03/20/2013   Low back pain radiating to right leg 02/20/2013   Chronic back pain 02/08/2013   Atherosclerotic peripheral vascular disease with intermittent claudication ([place]) 02/08/2013   Carotid  bruit 02/08/2013   High blood pressure 02/08/2013   LSIL (low grade squamous intraepithelial lesion) on Pap smear 12/14/2012   Osteopenia 10/02/2012   Hyperlipidemia 08/31/2012   Depression 08/31/2012   left nose skin lesion 08/31/2012   Drug abuse, history of    Bipolar disorder ([place])    Collagenous colitis    Medically noncompliant 07/26/2012   GERD (gastroesophageal reflux disease) 07/21/2012   Tobacco abuse 07/21/2012   Home Medication(s) Prior to Admission medications   Medication Sig Start Date End Date Taking? Authorizing Provider  ALPRAZolam Duanne Moron) 0.5 MG tablet Take 0.5 mg by mouth 2 (two) times daily as needed. 01/25/22   [provider]  dicyclomine (BENTYL) 20 MG tablet Take 1 tablet (20 mg total) by mouth every 6 (six) hours. 02/23/22   Levin Erp, PA  HYDROcodone-acetaminophen (NORCO/VICODIN) 5-325 MG tablet Take 1 tablet by mouth every 4 (four) hours as needed. 08/03/22   Gareth Morgan, MD  mirtazapine (REMERON) 30 MG tablet Take 30 mg by mouth at bedtime.  12/14/19   [provider]  omeprazole (PRILOSEC) 20 MG capsule Take 1 capsule (20 mg total) by mouth daily. 09/03/22   Levin Erp, PA  ondansetron (ZOFRAN-ODT) 4 MG disintegrating tablet Take 1 tablet (4 mg total) by mouth every 8 (eight) hours as needed for nausea or vomiting. 08/03/22   Gareth Morgan, MD  risedronate (ACTONEL) 35 MG tablet Take 35 mg by mouth once a week. 01/23/22   [provider]  rosuvastatin (CRESTOR) 5 MG tablet Take 5 mg by mouth daily. 01/29/22   [provider]                                                                                                                                    Past Surgical History Past Surgical History:  Procedure Laterality Date   ABDOMINAL HYSTERECTOMY  Age 27 years ago   partial    FOOT SURGERY     FRACTURE SURGERY     IR GENERIC HISTORICAL  07/16/2016   IR US GUIDE VASC ACCESS RIGHT 07/16/2016  Arne Cleveland, MD [facility]   IR GENERIC HISTORICAL  07/16/2016   IR ANGIOGRAM VISCERAL SELECTIVE 07/16/2016 Arne Cleveland, MD [facility]   IR GENERIC HISTORICAL  07/16/2016   IR ANGIOGRAM SELECTIVE EACH ADDITIONAL VESSEL 07/16/2016 Arne Cleveland, MD [facility]   IR GENERIC HISTORICAL  07/16/2016   IR EMBO ART  VEN HEMORR LYMPH EXTRAV  INC GUIDE ROADMAPPING 07/16/2016 Arne Cleveland, MD [facility]   IR GENERIC HISTORICAL  07/16/2016   IR ANGIOGRAM FOLLOW UP STUDY 07/16/2016 Arne Cleveland, MD [facility]   Lapidus fusion Right 04/17/2012   NOSE SURGERY     ORIF FIBULA FRACTURE Right 06/17/2020   Procedure: OPERATIVE TREATMENT OF RIGHT TIBIA AND FIBULA FRACTURE;  Surgeon: Erle Crocker, MD;  Location: [facility];  Service: Orthopedics;  Laterality: Right;  LENGTH OF SURGERY 2HOURS   OSTEOTOMY Right 04/17/2012   Rt #2 Metatarsal   SACROILIAC JOINT FUSION Right 06/10/2021   Procedure: RIGHT SACROILIAC JOINT FUSION;  Surgeon: Phylliss Bob, MD;  Location: [facility];  Service: Orthopedics;  Laterality: Right;   TONSILLECTOMY  As a child    WRIST SURGERY     Family History Family History  Problem Relation Age of Onset   Stroke Mother    Diabetes Father    Hypertension Father    Hyperlipidemia Father    Breast cancer Sister 22    Social History Social History   Tobacco Use   Smoking status: Some Days    Packs/day: 0.75    Years: 45.00    Additional pack years: 0.00    Total pack years: 33.75    Types: Cigarettes, Cigars   Smokeless tobacco: Never   Tobacco comments:    smoking cig for 35 years. continues to smoke 1/2 pack daily  Substance Use Topics   Alcohol use: Not Currently    Alcohol/week: 0.0 standard drinks of alcohol    Comment: ETOH abuse for 25 year, 1/2 gallon Liquor daily. admits 6 weeks course of drinking ETOH as her only intake in 2011. she states that she quits her drinnking since Feb 2012.   Drug use: Not Currently    Comment: pots before, clean since  1995   Allergies Erythrocin, Erythromycin, Sulfa antibiotics, Trazodone and nefazodone, and Tramadol  Review of Systems Review of Systems  All other systems  reviewed and are negative.   Physical Exam Vital Signs  I have reviewed the triage vital signs BP 118/69   Pulse 89   Temp (!) 97.3 F (36.3 C) (Oral)   Resp 18   Ht 5\' 6"  (1.676 m)   Wt 55.3 kg   SpO2 95%   BMI 19.69 kg/m  Physical Exam Vitals and nursing note reviewed.  Constitutional:      General: She is not in acute distress.    Appearance: She is well-developed.  HENT:     Head: Normocephalic.     Comments: Hematoma to left forehead    Mouth/Throat:     Mouth: Mucous membranes are moist.  Eyes:     Pupils: Pupils are equal, round, and reactive to light.  Cardiovascular:     Rate and Rhythm: Normal rate and regular rhythm.     Heart sounds: No murmur heard. Pulmonary:     Effort: Pulmonary effort is normal. No respiratory distress.     Breath sounds: Normal breath sounds.  Abdominal:     General: Abdomen is flat.     Palpations: Abdomen is soft.     Tenderness: There is no abdominal tenderness.  Musculoskeletal:        General: No tenderness.     Right lower leg: No edema.     Left lower leg: No edema.     Comments: No midline C, T, L-spine tenderness.  Left chest wall tenderness to palpation, no crepitus.  No painful range of motion no tenderness to the bilateral upper or lower extremities, active range of motion intact, no deformity.  Skin:    General: Skin is warm and dry.  Neurological:     General: No focal deficit present.     Mental Status: She is alert. Mental status is at baseline.  Psychiatric:        Mood and Affect: Mood normal.        Behavior: Behavior normal.     ED Results and Treatments Labs (all labs ordered are listed, but only abnormal results are displayed) Labs Reviewed  COMPREHENSIVE METABOLIC PANEL - Abnormal; Notable for the following components:      Result Value    Potassium 3.3 (*)    Creatinine, Ser 1.19 (*)    Calcium 8.6 (*)    Total Protein 6.2 (*)    Albumin 3.4 (*)    AST 56 (*)    ALT 64 (*)    GFR, Estimated 50 (*)    Anion gap 17 (*)    All other components within normal limits  LIPASE, BLOOD - Abnormal; Notable for the following components:   Lipase 64 (*)    All other components within normal limits  ETHANOL - Abnormal; Notable for the following components:   Alcohol, Ethyl (B) 108 (*)    All other components within normal limits  CBC WITH DIFFERENTIAL/PLATELET - Abnormal; Notable for the following components:   RBC 3.42 (*)    MCV 109.9 (*)    MCH 37.7 (*)    Platelets 123 (*)    nRBC 0.7 (*)    All other components within normal limits  CBC WITH DIFFERENTIAL/PLATELET  BRAIN NATRIURETIC PEPTIDE  VITAMIN B12  FOLATE  MAGNESIUM  RAPID URINE DRUG SCREEN, HOSP PERFORMED  HEPATITIS PANEL, ACUTE  TROPONIN I (HIGH SENSITIVITY)  TROPONIN I (HIGH SENSITIVITY)  Radiology CT CHEST ABDOMEN PELVIS W CONTRAST  Result Date: 03/06/2023 CLINICAL DATA:  Multiple recent falls.  Vomiting. EXAM: CT CHEST, ABDOMEN, AND PELVIS WITH CONTRAST TECHNIQUE: Multidetector CT imaging of the chest, abdomen and pelvis was performed following the standard protocol during bolus administration of intravenous contrast. RADIATION DOSE REDUCTION: This exam was performed according to the departmental dose-optimization program which includes automated exposure control, adjustment of the mA and/or kV according to patient size and/or use of iterative reconstruction technique. CONTRAST:  22mL OMNIPAQUE IOHEXOL 350 MG/ML SOLN COMPARISON:  Chest CT 02/02/2023.  Abdomen and pelvis CT 08/03/2022 FINDINGS: CT CHEST FINDINGS Cardiovascular: The heart size is normal. No substantial pericardial effusion. Advanced atherosclerotic calcification is noted in the  wall of the thoracic aorta. Mediastinum/Nodes: No mediastinal lymphadenopathy. There is no hilar lymphadenopathy. The esophagus has normal imaging features. There is no axillary lymphadenopathy. Lungs/Pleura: Centrilobular and paraseptal emphysema evident. Biapical pleuroparenchymal scarring evident. No suspicious pulmonary nodule or mass. No focal airspace consolidation. No pleural effusion. Compressive atelectasis noted dependent lung bases, right greater than left. Musculoskeletal: No worrisome lytic or sclerotic osseous abnormality. CT ABDOMEN PELVIS FINDINGS Hepatobiliary: No suspicious focal abnormality within the liver parenchyma. There is no evidence for gallstones, gallbladder wall thickening, or pericholecystic fluid. No intrahepatic or extrahepatic biliary dilation. Pancreas: No focal mass lesion. No dilatation of the main duct. No intraparenchymal cyst. No peripancreatic edema. Spleen: Cystic lesion anterior spleen stable at 3.2 x 3.0 cm. Status post splenic artery embolization. Adrenals/Urinary Tract: Stable thickening of both adrenal glands. Tiny nonobstructing stones noted lower pole right kidney. Left kidney unremarkable. No evidence for hydroureter. The urinary bladder appears normal for the degree of distention. Stomach/Bowel: Tiny hiatal hernia. Stomach otherwise unremarkable. Duodenum is normally positioned as is the ligament of Treitz. 12 mm cystic lesion identified in the descending duodenum (73/3), potentially submucosal or intramural, stable since prior. No small bowel wall thickening. No small bowel dilatation. The terminal ileum is normal. The appendix is normal. No gross colonic mass. No colonic wall thickening. Mild left-sided diverticulosis without diverticulitis. Vascular/Lymphatic: Insert calcium aorta Severe There is no gastrohepatic or hepatoduodenal ligament lymphadenopathy. No retroperitoneal or mesenteric lymphadenopathy. No pelvic sidewall lymphadenopathy. Reproductive:  Hysterectomy.  There is no adnexal mass. Other: No intraperitoneal free fluid. Musculoskeletal: No worrisome lytic or sclerotic osseous abnormality. Status post right SI joint fusion IMPRESSION: 1. No acute findings in the chest, abdomen, or pelvis. 2. Stable 12 mm cystic lesion in the descending duodenum, potentially submucosal or intramural. Stable since 08/03/2022, this is new since 11/01/2018. Upper endoscopy could be used to further evaluate as clinically warranted. 3. Hiatal hernia. 4. Mild left-sided diverticulosis without diverticulitis. 5. Tiny nonobstructing right renal stones. 6. Aortic Atherosclerosis (ICD10-I70.0) and Emphysema (ICD10-J43.9). Electronically Signed   By: Misty Stanley M.D.   On: 03/06/2023 09:25   CT Head Wo Contrast  Result Date: 03/06/2023 CLINICAL DATA:  Head trauma, minor (Age >= 65y); Neck trauma (Age >= 65y) EXAM: CT HEAD WITHOUT CONTRAST CT CERVICAL SPINE WITHOUT CONTRAST TECHNIQUE: Multidetector CT imaging of the head and cervical spine was performed following the standard protocol without intravenous contrast. Multiplanar CT image reconstructions of the cervical spine were also generated. RADIATION DOSE REDUCTION: This exam was performed according to the departmental dose-optimization program which includes automated exposure control, adjustment of the mA and/or kV according to patient size and/or use of iterative reconstruction technique. COMPARISON:  01/04/2020 FINDINGS: CT HEAD FINDINGS Brain: No evidence of acute infarction, hemorrhage, hydrocephalus, extra-axial collection or mass lesion/mass  effect. Vascular: Atherosclerotic calcifications involving the large vessels of the skull base. No unexpected hyperdense vessel. Skull: Normal. Negative for fracture or focal lesion. Sinuses/Orbits: Mild diffuse paranasal sinus mucosal thickening. No air-fluid levels. Other: Small anterior left frontal scalp hematoma. CT CERVICAL SPINE FINDINGS Alignment: Facet joints are aligned  without dislocation or traumatic listhesis. Dens and lateral masses are aligned. Exaggerated cervical lordosis, which may be positional. Skull base and vertebrae: No acute fracture. No primary bone lesion or focal pathologic process. Soft tissues and spinal canal: No prevertebral fluid or swelling. No visible canal hematoma. Disc levels: Intervertebral disc heights are preserved. Moderate multilevel cervical facet joint arthropathy. Upper chest: Biapical pleuroparenchymal scarring. Other: Bilateral carotid atherosclerosis. IMPRESSION: 1. No acute intracranial abnormality. 2. Small anterior left frontal scalp hematoma. No underlying calvarial fracture. 3. No acute fracture or subluxation of the cervical spine. Electronically Signed   By: Davina Poke D.O.   On: 03/06/2023 09:19   CT Cervical Spine Wo Contrast  Result Date: 03/06/2023 CLINICAL DATA:  Head trauma, minor (Age >= 65y); Neck trauma (Age >= 65y) EXAM: CT HEAD WITHOUT CONTRAST CT CERVICAL SPINE WITHOUT CONTRAST TECHNIQUE: Multidetector CT imaging of the head and cervical spine was performed following the standard protocol without intravenous contrast. Multiplanar CT image reconstructions of the cervical spine were also generated. RADIATION DOSE REDUCTION: This exam was performed according to the departmental dose-optimization program which includes automated exposure control, adjustment of the mA and/or kV according to patient size and/or use of iterative reconstruction technique. COMPARISON:  01/04/2020 FINDINGS: CT HEAD FINDINGS Brain: No evidence of acute infarction, hemorrhage, hydrocephalus, extra-axial collection or mass lesion/mass effect. Vascular: Atherosclerotic calcifications involving the large vessels of the skull base. No unexpected hyperdense vessel. Skull: Normal. Negative for fracture or focal lesion. Sinuses/Orbits: Mild diffuse paranasal sinus mucosal thickening. No air-fluid levels. Other: Small anterior left frontal scalp  hematoma. CT CERVICAL SPINE FINDINGS Alignment: Facet joints are aligned without dislocation or traumatic listhesis. Dens and lateral masses are aligned. Exaggerated cervical lordosis, which may be positional. Skull base and vertebrae: No acute fracture. No primary bone lesion or focal pathologic process. Soft tissues and spinal canal: No prevertebral fluid or swelling. No visible canal hematoma. Disc levels: Intervertebral disc heights are preserved. Moderate multilevel cervical facet joint arthropathy. Upper chest: Biapical pleuroparenchymal scarring. Other: Bilateral carotid atherosclerosis. IMPRESSION: 1. No acute intracranial abnormality. 2. Small anterior left frontal scalp hematoma. No underlying calvarial fracture. 3. No acute fracture or subluxation of the cervical spine. Electronically Signed   By: Davina Poke D.O.   On: 03/06/2023 09:19   DG Chest Port 1 View  Result Date: 03/06/2023 CLINICAL DATA:  67 year old female with chest pain, epigastric pain, 1 episode of vomiting. Multiple recent falls. Smoker. EXAM: PORTABLE CHEST 1 VIEW COMPARISON:  Screening chest CT 02/02/2023 and earlier. FINDINGS: Portable AP semi upright view at 0616 hours. Pulmonary hyperinflation demonstrated by CT last month, lower lung volumes today. Kyphotic positioning. Mediastinal contours are stable and within normal limits. Allowing for portable technique the lungs are clear. No pneumothorax or pleural effusion. No acute osseous abnormality identified. Bilateral axillary artery calcified atherosclerosis. Paucity bowel gas in the upper abdomen. IMPRESSION: No acute cardiopulmonary abnormality. Chronic pulmonary hyperinflation. Electronically Signed   By: Genevie Ann M.D.   On: 03/06/2023 06:40    Pertinent labs & imaging results that were available during my care of the patient were reviewed by me and considered in my medical decision making (see MDM for details).  Medications Ordered  in ED Medications  LORazepam  (ATIVAN) tablet 1-4 mg (has no administration in time range)    Or  LORazepam (ATIVAN) tablet 0.5 mg (has no administration in time range)  thiamine (VITAMIN B1) tablet 100 mg (has no administration in time range)    Or  thiamine (VITAMIN B1) injection 100 mg (has no administration in time range)  folic acid (FOLVITE) tablet 1 mg (has no administration in time range)  multivitamin with minerals tablet 1 tablet (has no administration in time range)  iohexol (OMNIPAQUE) 350 MG/ML injection 75 mL (75 mLs Intravenous Contrast Given 03/06/23 0856)  acetaminophen (TYLENOL) tablet 1,000 mg (1,000 mg Oral Given 03/06/23 1107)  ipratropium-albuterol (DUONEB) 0.5-2.5 (3) MG/3ML nebulizer solution 3 mL (3 mLs Nebulization Given 03/06/23 1140)  predniSONE (DELTASONE) tablet 60 mg (60 mg Oral Given 03/06/23 1140)                                                                                                                                     Procedures .Critical Care  Performed by: Cristie Hem, MD Authorized by: Cristie Hem, MD   Critical care provider statement:    Critical care time (minutes):  30   Critical care was time spent personally by me on the following activities:  Development of treatment plan with patient or surrogate, discussions with consultants, evaluation of patient's response to treatment, examination of patient, ordering and review of laboratory studies, ordering and review of radiographic studies, ordering and performing treatments and interventions, pulse oximetry, re-evaluation of patient's condition and review of old charts   Care discussed with: admitting provider     (including critical care time)  Medical Decision Making / ED Course   MDM:  67 year old female presenting to the emergency department with chest pain.  Suspect most likely cause of chest pain is related to fall.  Patient reports multiple recent falls.  She has reproducible chest pain.  She also  has a hematoma on her forehead.  Labs notable for elevated alcohol level.  Patient seems clinically sober currently, but given age and frequent falls with alcohol use, will obtain CT head and chest abdomen pelvis to evaluate for occult traumatic injury such as rib fracture.  Lower concern for pneumothorax.  Patient hemodynamically stable.  Her neurologic exam is reassuring.  Given frequent falls patient may need admission for further evaluation.  Will assess for underlying infection but patient denies fevers or chills or any other focal infectious symptoms.  Clinical Course as of 03/06/23 1238  Sun Mar 06, 2023  1234 Trauma workup negative for any acute traumatic injury.  Negative CT head, chest abdomen pelvis.  Patient reports that she has been falling frequently at home and feels unsafe at home.  While in the emergency department, patient also became hypoxic to 88%.  She denies any specific history of emphysema, on pulmonary exam she does have some poor air movement and  trace wheezing suggestive of COPD or reactive airway disease.  Will give steroids, DuoNeb.  Given hypoxia and frequent falls, discussed the hospitalist to admit the patient for further inpatient management such as breathing treatments and physical therapy evaluation. [WS]    Clinical Course User Index [WS] Cristie Hem, MD     Additional history obtained: -Additional history obtained from ems -External records from outside source obtained and reviewed including: Chart review including previous notes, labs, imaging, consultation notes including ED visit for abdominal pain 08/03/22   Lab Tests: -I ordered, reviewed, and interpreted labs.   The pertinent results include:   Labs Reviewed  COMPREHENSIVE METABOLIC PANEL - Abnormal; Notable for the following components:      Result Value   Potassium 3.3 (*)    Creatinine, Ser 1.19 (*)    Calcium 8.6 (*)    Total Protein 6.2 (*)    Albumin 3.4 (*)    AST 56 (*)    ALT 64  (*)    GFR, Estimated 50 (*)    Anion gap 17 (*)    All other components within normal limits  LIPASE, BLOOD - Abnormal; Notable for the following components:   Lipase 64 (*)    All other components within normal limits  ETHANOL - Abnormal; Notable for the following components:   Alcohol, Ethyl (B) 108 (*)    All other components within normal limits  CBC WITH DIFFERENTIAL/PLATELET - Abnormal; Notable for the following components:   RBC 3.42 (*)    MCV 109.9 (*)    MCH 37.7 (*)    Platelets 123 (*)    nRBC 0.7 (*)    All other components within normal limits  CBC WITH DIFFERENTIAL/PLATELET  BRAIN NATRIURETIC PEPTIDE  VITAMIN B12  FOLATE  MAGNESIUM  RAPID URINE DRUG SCREEN, HOSP PERFORMED  HEPATITIS PANEL, ACUTE  TROPONIN I (HIGH SENSITIVITY)  TROPONIN I (HIGH SENSITIVITY)    Notable for nonspecific mild lipase elevation, mild elevation in ethanol  EKG   EKG Interpretation  Date/Time:  Sunday March 06 2023 06:02:43 EDT Ventricular Rate:  91 PR Interval:  119 QRS Duration: 89 QT Interval:  365 QTC Calculation: 450 R Axis:   -27 Text Interpretation: Ectopic atrial rhythm Borderline short PR interval Borderline left axis deviation Repol abnrm suggests ischemia, anterior leads Confirmed by Garnette Gunner [phone]) on 03/06/2023 7:05:03 AM         Imaging Studies ordered: I ordered imaging studies including CT head and neck, CT C/A/P On my interpretation imaging demonstrates no acute dangerous traumatic injury I independently visualized and interpreted imaging. I agree with the radiologist interpretation   Medicines ordered and prescription drug management: Meds ordered this encounter  Medications   iohexol (OMNIPAQUE) 350 MG/ML injection 75 mL   acetaminophen (TYLENOL) tablet 1,000 mg   ipratropium-albuterol (DUONEB) 0.5-2.5 (3) MG/3ML nebulizer solution 3 mL   predniSONE (DELTASONE) tablet 60 mg   OR Linked Order Group    LORazepam (ATIVAN) tablet 1-4 mg      Order Specific Question:   CIWA-AR < 5 =     Answer:   0 mg     Order Specific Question:   CIWA-AR 5 -10 =     Answer:   1 mg     Order Specific Question:   CIWA-AR 11 -15 =     Answer:   2 mg     Order Specific Question:   CIWA-AR 16 -20 =     Answer:  3 mg     Order Specific Question:   CIWA-AR 16 -20 =     Answer:   Recheck CIWA-AR in 1 hour; if > 20 notify MD     Order Specific Question:   CIWA-AR > 20 =     Answer:   4 mg     Order Specific Question:   CIWA-AR > 20 =     Answer:   Call Rapid Response    LORazepam (ATIVAN) tablet 0.5 mg   OR Linked Order Group    thiamine (VITAMIN B1) tablet 100 mg    thiamine (VITAMIN B1) injection 123XX123 mg   folic acid (FOLVITE) tablet 1 mg   multivitamin with minerals tablet 1 tablet    -I have reviewed the patients home medicines and have made adjustments as needed   Consultations Obtained: I requested consultation with the hospitalist,  and discussed lab and imaging findings as well as pertinent plan - they recommend: admission   Cardiac Monitoring: The patient was maintained on a cardiac monitor.  I personally viewed and interpreted the cardiac monitored which showed an underlying rhythm of: NSR  Social Determinants of Health:  Diagnosis or treatment significantly limited by social determinants of health: current smoker, alcohol use, and lives alone   Reevaluation: After the interventions noted above, I reevaluated the patient and found that their symptoms have improved  Co morbidities that complicate the patient evaluation  Past Medical History:  Diagnosis Date   Alcohol abuse last February 2012   Arthritis    Bipolar disorder ([place])    Collagenous colitis    Drug abuse ([place]) last 1990's   Peripheral neuropathy 11/29/2018   Pneumonia       Dispostion: Disposition decision including need for hospitalization was considered, and patient admitted to the hospital.    Final Clinical Impression(s) / ED Diagnoses Final  diagnoses:  Contusion of left chest wall, initial encounter  Traumatic hematoma of forehead, initial encounter  Hypoxia  Frequent falls     This chart was dictated using voice recognition software.  Despite best efforts to proofread,  errors can occur which can change the documentation meaning.    Cristie Hem, MD 03/06/23 [phone]

## 2023-03-06 NOTE — Assessment & Plan Note (Addendum)
Recurrent imaging with no acute finding Seen by GI thought IBS and started on bentyl  No complaints for worsening pain

## 2023-03-06 NOTE — Assessment & Plan Note (Addendum)
Has past history of significant alcohol abuse. Per chart was still drinking heavy per sister in 2022, but she denies heavy use today and states she will drink a little at night -will not quantify how much -ethanol 108 today -elevated LFTS -start CIWA protocol -MV/thiamine/folic acid  -SW consutl

## 2023-03-06 NOTE — Assessment & Plan Note (Addendum)
-  She states cause of her falls -On no medication and ? If alcohol also contributes to falls and neuropathy -seen by [facility] and underwent an EMG/NCS in 2019 which showed "a primarily axonal peripheral neuropathy of moderate severity.  -hx of b12 deficiency and is no longer taking this, will check b12 -lives alone, PT eval  -Fall precautions

## 2023-03-06 NOTE — Assessment & Plan Note (Signed)
-  exam consistent with costochondritis -supportive care -x1 dose of toradol -heating pad

## 2023-03-06 NOTE — ED Triage Notes (Signed)
Pt has epigastric pain with no radiation and one episode of vomiting. She also has had multiple falls recently due to her neuropathy. 12 lead unremarkable, Bp 105/55, HR of 81, SPO2 95% RA, RR 16. Current smoker. No other complaints. No IV access, no medications given.

## 2023-03-06 NOTE — Assessment & Plan Note (Signed)
Continue daily PPI.  

## 2023-03-06 NOTE — H&P (Signed)
History and Physical    Patient: Monique Perry [id number] DOB: 11/05/1956 DOA: 03/06/2023 DOS: the patient was seen and examined on 03/06/2023 PCP: Kathyrn Lass, MD  Patient coming from: Home - lives alone. Uses walker and cane to ambulate.    Chief Complaint: frequent falls/chest pain  HPI: Monique Perry is a 67 y.o. female with medical history significant of alcohol abuse, bipolar disorder, HLD, GERD who presented to hospital with complaints of falls and chest pain.  She fell 3x Thursday night and called EMS 2x to help her up. They suggested she come to ED, but she declined. She states she fell from her neuropathy.  She states today she started to have chest pain.  She has chronic chest pain, but it was worse today.  Pain in the center of her chest and described as heavy on her chest. The pain did not radiate. No associated symptoms. She states the pain never goes away. Her friends came over and suggested she come to ED. She denies any shortness of breath, wheezing or cough. She does have some nausea at times. She has diarrhea at times from her IBS.    Denies any fever/chills, vision changes/headaches, palpitations, shortness of breath or cough, abdominal pain, dysuria or leg swelling. She has not been eating well or sleeping well.    She does smoke cigars and does drink alcohol daily. She states she drinks a little at night, but wont quantify how much.   ER Course:  vitals: afebrile, bp: 118/69, HR; 89, RR: 18, oxygen: 95%2L [place] Pertinent labs: mcv: 109, platelets 123, lipase 64, potassium: 3.3, creatinine: 1.19, AST: 56, ALT: 64, AG: 17, ethanol: 108,  CXR: no acute finding CT head: no acute finding. Small left frontal scalp hematoma.  CT neck: no acute fracture CT chest/abdomen/pelvis: no acute findings. Stable 68mm cystic lesion in the descending duodenum. Stable since 8/23, but new since 10/2018. Hiatal hernia. Tiny non obstructing right renal stones. Aortic atherosclerosis.   In ED: given oral prednisone, duoneb and TRH asked to admit.    Review of Systems: As mentioned in the history of present illness. All other systems reviewed and are negative. Past Medical History:  Diagnosis Date   Alcohol abuse last February 2012   Arthritis    Bipolar disorder ([place])    Collagenous colitis    Drug abuse ([place]) last 1990's   Peripheral neuropathy 11/29/2018   Pneumonia    Past Surgical History:  Procedure Laterality Date   ABDOMINAL HYSTERECTOMY  Age 48 years ago   partial    FOOT SURGERY     FRACTURE SURGERY     IR GENERIC HISTORICAL  07/16/2016   IR US GUIDE VASC ACCESS RIGHT 07/16/2016 Arne Cleveland, MD [facility]   IR GENERIC HISTORICAL  07/16/2016   IR ANGIOGRAM VISCERAL SELECTIVE 07/16/2016 Arne Cleveland, MD [facility]   IR GENERIC HISTORICAL  07/16/2016   IR ANGIOGRAM SELECTIVE EACH ADDITIONAL VESSEL 07/16/2016 Arne Cleveland, MD [facility]   IR GENERIC HISTORICAL  07/16/2016   IR EMBO ART  VEN HEMORR [place] 07/16/2016 Arne Cleveland, MD [facility]   IR GENERIC HISTORICAL  07/16/2016   IR ANGIOGRAM FOLLOW UP STUDY 07/16/2016 Arne Cleveland, MD [facility]   [place] fusion Right 04/17/2012   NOSE SURGERY     ORIF FIBULA FRACTURE Right 06/17/2020   Procedure: OPERATIVE TREATMENT OF RIGHT TIBIA AND FIBULA FRACTURE;  Surgeon: Erle Crocker, MD;  Location: [place];  Service:  Orthopedics;  Laterality: Right;  LENGTH OF SURGERY 2HOURS   OSTEOTOMY Right 04/17/2012   Rt #2 Metatarsal   SACROILIAC JOINT FUSION Right 06/10/2021   Procedure: RIGHT SACROILIAC JOINT FUSION;  Surgeon: Phylliss Bob, MD;  Location: [place];  Service: Orthopedics;  Laterality: Right;   TONSILLECTOMY  As a child    WRIST SURGERY     Social History:  reports that she has been smoking cigarettes and cigars. She has a 33.75 pack-year smoking history. She has never used smokeless tobacco. She reports that she does not currently use alcohol. She  reports that she does not currently use drugs.  Allergies  Allergen Reactions   Erythrocin Other (See Comments)    Thrush   Erythromycin Other (See Comments)    thrush   Sulfa Antibiotics Other (See Comments)    Thrush    Trazodone And Nefazodone Other (See Comments)    Severe insomnia   Tramadol Itching    Family History  Problem Relation Age of Onset   Stroke Mother    Diabetes Father    Hypertension Father    Hyperlipidemia Father    Breast cancer Sister 48    Prior to Admission medications   Medication Sig Start Date End Date Taking? Authorizing Provider  ALPRAZolam Duanne Moron) 0.5 MG tablet Take 0.5 mg by mouth 2 (two) times daily as needed. 01/25/22   [provider]  dicyclomine (BENTYL) 20 MG tablet Take 1 tablet (20 mg total) by mouth every 6 (six) hours. 02/23/22   Levin Erp, PA  HYDROcodone-acetaminophen (NORCO/VICODIN) 5-325 MG tablet Take 1 tablet by mouth every 4 (four) hours as needed. 08/03/22   Gareth Morgan, MD  mirtazapine (REMERON) 30 MG tablet Take 30 mg by mouth at bedtime.  12/14/19   [provider]  omeprazole (PRILOSEC) 20 MG capsule Take 1 capsule (20 mg total) by mouth daily. 09/03/22   Levin Erp, PA  ondansetron (ZOFRAN-ODT) 4 MG disintegrating tablet Take 1 tablet (4 mg total) by mouth every 8 (eight) hours as needed for nausea or vomiting. 08/03/22   Gareth Morgan, MD  risedronate (ACTONEL) 35 MG tablet Take 35 mg by mouth once a week. 01/23/22   [provider]  rosuvastatin (CRESTOR) 5 MG tablet Take 5 mg by mouth daily. 01/29/22   [provider]    Physical Exam: Vitals:   03/06/23 1237 03/06/23 1238 03/06/23 1321 03/06/23 1441  BP:   134/70   Pulse: 89 94 81 76  Resp:    18  Temp:   98.4 F (36.9 C)   TempSrc:   Oral   SpO2: 90% 93% 95% 93%  Weight:      Height:       General:  Appears calm and comfortable and is in NAD. She has a bruise on her forehead  Eyes:  PERRL, EOMI,  normal lids, iris ENT:  grossly normal hearing, lips & tongue, mmm; appropriate dentition Neck:  no LAD, masses or thyromegaly; no carotid bruits Cardiovascular:  RRR, no m/r/g. No LE edema.  Respiratory:   CTA bilaterally with no wheezes/rales/rhonchi.  Normal respiratory effort. Abdomen:  soft, NT, ND, NABS Back:   normal alignment, no CVAT Skin:  no rash or induration seen on limited exam. Bruising on forehead and right shoulder. Bruising on her right shoulder. Skin tenting.  Musculoskeletal:  grossly normal tone BUE/BLE, good ROM, no bony abnormality. TTP over sternum Lower extremity:  No LE edema.  Limited foot exam with no ulcerations.  2+ distal pulses. Psychiatric:  grossly normal mood and affect, speech fluent and appropriate, AOx3 Neurologic:  CN 2-12 grossly intact, moves all extremities in coordinated fashion, sensation intact   Radiological Exams on Admission: Independently reviewed - see discussion in A/P where applicable  CT CHEST ABDOMEN PELVIS W CONTRAST  Result Date: 03/06/2023 CLINICAL DATA:  Multiple recent falls.  Vomiting. EXAM: CT CHEST, ABDOMEN, AND PELVIS WITH CONTRAST TECHNIQUE: Multidetector CT imaging of the chest, abdomen and pelvis was performed following the standard protocol during bolus administration of intravenous contrast. RADIATION DOSE REDUCTION: This exam was performed according to the departmental dose-optimization program which includes automated exposure control, adjustment of the mA and/or kV according to patient size and/or use of iterative reconstruction technique. CONTRAST:  43mL OMNIPAQUE IOHEXOL 350 MG/ML SOLN COMPARISON:  Chest CT 02/02/2023.  Abdomen and pelvis CT 08/03/2022 FINDINGS: CT CHEST FINDINGS Cardiovascular: The heart size is normal. No substantial pericardial effusion. Advanced atherosclerotic calcification is noted in the wall of the thoracic aorta. Mediastinum/Nodes: No mediastinal lymphadenopathy. There is no hilar lymphadenopathy.  The esophagus has normal imaging features. There is no axillary lymphadenopathy. Lungs/Pleura: Centrilobular and paraseptal emphysema evident. Biapical pleuroparenchymal scarring evident. No suspicious pulmonary nodule or mass. No focal airspace consolidation. No pleural effusion. Compressive atelectasis noted dependent lung bases, right greater than left. Musculoskeletal: No worrisome lytic or sclerotic osseous abnormality. CT ABDOMEN PELVIS FINDINGS Hepatobiliary: No suspicious focal abnormality within the liver parenchyma. There is no evidence for gallstones, gallbladder wall thickening, or pericholecystic fluid. No intrahepatic or extrahepatic biliary dilation. Pancreas: No focal mass lesion. No dilatation of the main duct. No intraparenchymal cyst. No peripancreatic edema. Spleen: Cystic lesion anterior spleen stable at 3.2 x 3.0 cm. Status post splenic artery embolization. Adrenals/Urinary Tract: Stable thickening of both adrenal glands. Tiny nonobstructing stones noted lower pole right kidney. Left kidney unremarkable. No evidence for hydroureter. The urinary bladder appears normal for the degree of distention. Stomach/Bowel: Tiny hiatal hernia. Stomach otherwise unremarkable. Duodenum is normally positioned as is the ligament of Treitz. 12 mm cystic lesion identified in the descending duodenum (73/3), potentially submucosal or intramural, stable since prior. No small bowel wall thickening. No small bowel dilatation. The terminal ileum is normal. The appendix is normal. No gross colonic mass. No colonic wall thickening. Mild left-sided diverticulosis without diverticulitis. Vascular/Lymphatic: Insert calcium aorta Severe There is no gastrohepatic or hepatoduodenal ligament lymphadenopathy. No retroperitoneal or mesenteric lymphadenopathy. No pelvic sidewall lymphadenopathy. Reproductive: Hysterectomy.  There is no adnexal mass. Other: No intraperitoneal free fluid. Musculoskeletal: No worrisome lytic or  sclerotic osseous abnormality. Status post right SI joint fusion IMPRESSION: 1. No acute findings in the chest, abdomen, or pelvis. 2. Stable 12 mm cystic lesion in the descending duodenum, potentially submucosal or intramural. Stable since 08/03/2022, this is new since 11/01/2018. Upper endoscopy could be used to further evaluate as clinically warranted. 3. Hiatal hernia. 4. Mild left-sided diverticulosis without diverticulitis. 5. Tiny nonobstructing right renal stones. 6. Aortic Atherosclerosis (ICD10-I70.0) and Emphysema (ICD10-J43.9). Electronically Signed   By: Misty Stanley M.D.   On: 03/06/2023 09:25   CT Head Wo Contrast  Result Date: 03/06/2023 CLINICAL DATA:  Head trauma, minor (Age >= 65y); Neck trauma (Age >= 65y) EXAM: CT HEAD WITHOUT CONTRAST CT CERVICAL SPINE WITHOUT CONTRAST TECHNIQUE: Multidetector CT imaging of the head and cervical spine was performed following the standard protocol without intravenous contrast. Multiplanar CT image reconstructions of the cervical spine were also generated. RADIATION DOSE REDUCTION: This exam was performed according to the  departmental dose-optimization program which includes automated exposure control, adjustment of the mA and/or kV according to patient size and/or use of iterative reconstruction technique. COMPARISON:  01/04/2020 FINDINGS: CT HEAD FINDINGS Brain: No evidence of acute infarction, hemorrhage, hydrocephalus, extra-axial collection or mass lesion/mass effect. Vascular: Atherosclerotic calcifications involving the large vessels of the skull base. No unexpected hyperdense vessel. Skull: Normal. Negative for fracture or focal lesion. Sinuses/Orbits: Mild diffuse paranasal sinus mucosal thickening. No air-fluid levels. Other: Small anterior left frontal scalp hematoma. CT CERVICAL SPINE FINDINGS Alignment: Facet joints are aligned without dislocation or traumatic listhesis. Dens and lateral masses are aligned. Exaggerated cervical lordosis, which  may be positional. Skull base and vertebrae: No acute fracture. No primary bone lesion or focal pathologic process. Soft tissues and spinal canal: No prevertebral fluid or swelling. No visible canal hematoma. Disc levels: Intervertebral disc heights are preserved. Moderate multilevel cervical facet joint arthropathy. Upper chest: Biapical pleuroparenchymal scarring. Other: Bilateral carotid atherosclerosis. IMPRESSION: 1. No acute intracranial abnormality. 2. Small anterior left frontal scalp hematoma. No underlying calvarial fracture. 3. No acute fracture or subluxation of the cervical spine. Electronically Signed   By: Davina Poke D.O.   On: 03/06/2023 09:19   CT Cervical Spine Wo Contrast  Result Date: 03/06/2023 CLINICAL DATA:  Head trauma, minor (Age >= 65y); Neck trauma (Age >= 65y) EXAM: CT HEAD WITHOUT CONTRAST CT CERVICAL SPINE WITHOUT CONTRAST TECHNIQUE: Multidetector CT imaging of the head and cervical spine was performed following the standard protocol without intravenous contrast. Multiplanar CT image reconstructions of the cervical spine were also generated. RADIATION DOSE REDUCTION: This exam was performed according to the departmental dose-optimization program which includes automated exposure control, adjustment of the mA and/or kV according to patient size and/or use of iterative reconstruction technique. COMPARISON:  01/04/2020 FINDINGS: CT HEAD FINDINGS Brain: No evidence of acute infarction, hemorrhage, hydrocephalus, extra-axial collection or mass lesion/mass effect. Vascular: Atherosclerotic calcifications involving the large vessels of the skull base. No unexpected hyperdense vessel. Skull: Normal. Negative for fracture or focal lesion. Sinuses/Orbits: Mild diffuse paranasal sinus mucosal thickening. No air-fluid levels. Other: Small anterior left frontal scalp hematoma. CT CERVICAL SPINE FINDINGS Alignment: Facet joints are aligned without dislocation or traumatic listhesis. Dens  and lateral masses are aligned. Exaggerated cervical lordosis, which may be positional. Skull base and vertebrae: No acute fracture. No primary bone lesion or focal pathologic process. Soft tissues and spinal canal: No prevertebral fluid or swelling. No visible canal hematoma. Disc levels: Intervertebral disc heights are preserved. Moderate multilevel cervical facet joint arthropathy. Upper chest: Biapical pleuroparenchymal scarring. Other: Bilateral carotid atherosclerosis. IMPRESSION: 1. No acute intracranial abnormality. 2. Small anterior left frontal scalp hematoma. No underlying calvarial fracture. 3. No acute fracture or subluxation of the cervical spine. Electronically Signed   By: Davina Poke D.O.   On: 03/06/2023 09:19   DG Chest Port 1 View  Result Date: 03/06/2023 CLINICAL DATA:  67 year old female with chest pain, epigastric pain, 1 episode of vomiting. Multiple recent falls. Smoker. EXAM: PORTABLE CHEST 1 VIEW COMPARISON:  Screening chest CT 02/02/2023 and earlier. FINDINGS: Portable AP semi upright view at 0616 hours. Pulmonary hyperinflation demonstrated by CT last month, lower lung volumes today. Kyphotic positioning. Mediastinal contours are stable and within normal limits. Allowing for portable technique the lungs are clear. No pneumothorax or pleural effusion. No acute osseous abnormality identified. Bilateral axillary artery calcified atherosclerosis. Paucity bowel gas in the upper abdomen. IMPRESSION: No acute cardiopulmonary abnormality. Chronic pulmonary hyperinflation. Electronically Signed   By:  Genevie Ann M.D.   On: 03/06/2023 06:40    EKG: Independently reviewed.  Ectopic atrial rhythm with rate 91; nonspecific ST changes with no evidence of acute ischemia   Labs on Admission: I have personally reviewed the available labs and imaging studies at the time of the admission.  Pertinent labs:   mcv: 109,  platelets 123,  lipase 64,  potassium: 3.3,  creatinine: 1.19,  AST:  56,  ALT: 64,  AG: 17,  ethanol: 108  Assessment and Plan: Principal Problem:   Acute respiratory failure with hypoxia (HCC) Active Problems:   Alcohol abuse   Costochondritis   Hypokalemia   Frequent falls   Peripheral neuropathy   Elevated transaminase level   Bipolar disorder (HCC)   GERD (gastroesophageal reflux disease)   Hyperlipidemia   Tobacco abuse   Osteopenia   Chronic abdominal pain    Assessment and Plan: * Acute respiratory failure with hypoxia ([place]) 67 year old presenting to ED with complaints of chest pain and multiple falls, but found to be hypoxic on room air to 88% and requiring 2L oxygen [place] to maintain oxygenation. Was wheezing with poor air movement in ED -obs to telemetry  -has findings of paraseptal and centrilobular emphysema on chest CT and could have exacerbation although she has no complaints of shorntess of breath, wheezing, tightness or cough -clear on my exam, but after duonebs/steroids. Will continue to treat for possible COPD exacerbation with scheduled duonebs, SABA prn and oral prednisone -no indication for abx  -no signs of volume overload, but will check BNP -with lung disease, ? Pulmonary HTN, will check echo  -flutter valve  -wean as tolerated.   Alcohol abuse Has past history of significant alcohol abuse. Per chart was still drinking heavy per sister in 2022, but she denies heavy use today and states she will drink a little at night -will not quantify how much -ethanol 108 today -elevated LFTS -start CIWA protocol -MV/thiamine/folic acid  -SW consutl   Costochondritis -exam consistent with costochondritis -supportive care -x1 dose of toradol -heating pad   Hypokalemia Check magnesium Replete and trend   Frequent falls Multiple falls at home she contributes to neuropathy ? If alcohol playing a roll PT/OT to see   Peripheral neuropathy -She states cause of her falls -On no medication and ? If alcohol also contributes  to falls and neuropathy -seen by [facility] and underwent an EMG/NCS in 2019 which showed "a primarily axonal peripheral neuropathy of moderate severity.  -hx of b12 deficiency and is no longer taking this, will check b12 -lives alone, PT eval  -Fall precautions   Elevated transaminase level At baseline, likely from alcohol Check hepatitis panel  CT abdomen with no acute liver changes   Bipolar disorder ([place]) On no medication  She states she goes days without eating, sleeps poorly and has fast speech, not pressured No si/hi  Psychiatry consult   GERD (gastroesophageal reflux disease) Continue daily PPI   Hyperlipidemia Continue crestor 5mg  daily   Tobacco abuse Smokes cigars Declines nicotine patch   Osteopenia Due for her weekly tablet today and will give this today   Chronic abdominal pain Recurrent imaging with no acute finding Seen by GI thought IBS and started on bentyl  No complaints for worsening pain      Advance Care Planning:   Code Status: DNR   Consults: PT/OT/SW   DVT Prophylaxis: lovenox   Family Communication: none   Severity of Illness: The appropriate patient status  for this patient is OBSERVATION. Observation status is judged to be reasonable and necessary in order to provide the required intensity of service to ensure the patient's safety. The patient's presenting symptoms, physical exam findings, and initial radiographic and laboratory data in the context of their medical condition is felt to place them at decreased risk for further clinical deterioration. Furthermore, it is anticipated that the patient will be medically stable for discharge from the hospital within 2 midnights of admission.   Author: Orma Flaming, MD 03/06/2023 3:04 PM  For on call review [email or web address].

## 2023-03-06 NOTE — ED Notes (Addendum)
Pt's O2 sats at 88 to 89% at this time. Pt placed on 2L [place].

## 2023-03-06 NOTE — Assessment & Plan Note (Signed)
Smokes cigars Declines nicotine patch

## 2023-03-06 NOTE — Assessment & Plan Note (Signed)
67 year old presenting to ED with complaints of chest pain and multiple falls, but found to be hypoxic on room air to 88% and requiring 2L oxygen [place] to maintain oxygenation. Was wheezing with poor air movement in ED -obs to telemetry  -has findings of paraseptal and centrilobular emphysema on chest CT and could have exacerbation although she has no complaints of shorntess of breath, wheezing, tightness or cough -clear on my exam, but after duonebs/steroids. Will continue to treat for possible COPD exacerbation with scheduled duonebs, SABA prn and oral prednisone -no indication for abx  -no signs of volume overload, but will check BNP -with lung disease, ? Pulmonary HTN, will check echo  -flutter valve  -wean as tolerated.

## 2023-03-06 NOTE — Assessment & Plan Note (Addendum)
On no medication  She states she goes days without eating, sleeps poorly and has fast speech, not pressured No si/hi  Psychiatry consult

## 2023-03-06 NOTE — ED Notes (Signed)
ED TO INPATIENT HANDOFF REPORT  ED Nurse Name and Phone #: [phone]  S Name/Age/Gender Monique Perry Monique Perry 67 y.o. female Room/Bed: 025C/025C  Code Status   Code Status: Prior  Home/SNF/Other Home Patient oriented to: self, place, time, and situation Is this baseline? Yes   Triage Complete: Triage complete  Chief Complaint Acute respiratory failure with hypoxia ([place]) [J96.01]  Triage Note Pt has epigastric pain with no radiation and one episode of vomiting. She also has had multiple falls recently due to her neuropathy. 12 lead unremarkable, Bp 105/55, HR of 81, SPO2 95% RA, RR 16. Current smoker. No other complaints. No IV access, no medications given.     Allergies Allergies  Allergen Reactions   Erythrocin Other (See Comments)    Thrush   Erythromycin Other (See Comments)    thrush   Sulfa Antibiotics Other (See Comments)    Thrush    Trazodone And Nefazodone Other (See Comments)    Severe insomnia   Tramadol Itching    Level of Care/Admitting Diagnosis ED Disposition     ED Disposition  Admit   Condition  --   [place]: [place] [[id number]]  Level of Care: Telemetry Medical [104]  May place patient in observation at [facility] or [facility] if equivalent level of care is available:: No  Covid Evaluation: Asymptomatic - no recent exposure (last 10 days) testing not required  Diagnosis: Acute respiratory failure with hypoxia [facility]) [id number]  Admitting Physician: Orma Flaming [id number]  Attending Physician: Adora Fridge          B Medical/Surgery History Past Medical History:  Diagnosis Date   Alcohol abuse last February 2012   Arthritis    Bipolar disorder ([place])    Collagenous colitis    Drug abuse ([place]) last 1990's   Peripheral neuropathy 11/29/2018   Pneumonia    Past Surgical History:  Procedure Laterality Date   ABDOMINAL HYSTERECTOMY  Age 63 years ago   partial    FOOT SURGERY     FRACTURE  SURGERY     IR GENERIC HISTORICAL  07/16/2016   IR US GUIDE VASC ACCESS RIGHT 07/16/2016 Arne Cleveland, MD [facility]   IR GENERIC HISTORICAL  07/16/2016   IR ANGIOGRAM VISCERAL SELECTIVE 07/16/2016 Arne Cleveland, MD [facility]   IR GENERIC HISTORICAL  07/16/2016   IR ANGIOGRAM SELECTIVE EACH ADDITIONAL VESSEL 07/16/2016 Arne Cleveland, MD [facility]   IR GENERIC HISTORICAL  07/16/2016   IR EMBO ART  VEN HEMORR [place] 07/16/2016 Arne Cleveland, MD [facility]   IR GENERIC HISTORICAL  07/16/2016   IR ANGIOGRAM FOLLOW UP STUDY 07/16/2016 Arne Cleveland, MD [facility]   [place] fusion Right 04/17/2012   NOSE SURGERY     ORIF FIBULA FRACTURE Right 06/17/2020   Procedure: OPERATIVE TREATMENT OF RIGHT TIBIA AND FIBULA FRACTURE;  Surgeon: Erle Crocker, MD;  Location: [facility];  Service: Orthopedics;  Laterality: Right;  LENGTH OF SURGERY 2HOURS   OSTEOTOMY Right 04/17/2012   Rt #2 Metatarsal   SACROILIAC JOINT FUSION Right 06/10/2021   Procedure: RIGHT SACROILIAC JOINT FUSION;  Surgeon: Phylliss Bob, MD;  Location: [facility];  Service: Orthopedics;  Laterality: Right;   TONSILLECTOMY  As a child    WRIST SURGERY       A IV Location/Drains/Wounds Patient Lines/Drains/Airways Status     Active Line/Drains/Airways     Name Placement date Placement time Site Days   Peripheral IV  03/06/23 22 G Anterior;Right Forearm 03/06/23  0637  Forearm  less than 1            Intake/Output Last 24 hours No intake or output data in the 24 hours ending 03/06/23 1237  Labs/Imaging Results for orders placed or performed during the hospital encounter of 03/06/23 (from the past 48 hour(s))  Comprehensive metabolic panel     Status: Abnormal   Collection Time: 03/06/23  6:26 AM  Result Value Ref Range   Sodium 140 135 - 145 mmol/L   Potassium 3.3 (L) 3.5 - 5.1 mmol/L   Chloride 101 98 - 111 mmol/L   CO2 22 22 - 32 mmol/L   Glucose, Bld 98 70 - 99 mg/dL     Comment: Glucose reference range applies only to samples taken after fasting for at least 8 hours.   BUN 17 8 - 23 mg/dL   Creatinine, Ser 1.19 (H) 0.44 - 1.00 mg/dL   Calcium 8.6 (L) 8.9 - 10.3 mg/dL   Total Protein 6.2 (L) 6.5 - 8.1 g/dL   Albumin 3.4 (L) 3.5 - 5.0 g/dL   AST 56 (H) 15 - 41 U/L   ALT 64 (H) 0 - 44 U/L   Alkaline Phosphatase 86 38 - 126 U/L   Total Bilirubin 0.9 0.3 - 1.2 mg/dL   GFR, Estimated 50 (L) >60 mL/min    Comment: (NOTE) Calculated using the CKD-EPI Creatinine Equation (2021)    Anion gap 17 (H) 5 - 15    Comment: Performed at [facility], [place] [address] [place], [place] [address]  Lipase, blood     Status: Abnormal   Collection Time: 03/06/23  6:26 AM  Result Value Ref Range   Lipase 64 (H) 11 - 51 U/L    Comment: Performed at [facility] [address], [place], [place] [address]  Troponin I (High Sensitivity)     Status: None   Collection Time: 03/06/23  6:26 AM  Result Value Ref Range   Troponin I (High Sensitivity) 9 <18 ng/L    Comment: (NOTE) Elevated high sensitivity troponin I (hsTnI) values and significant  changes across serial measurements may suggest ACS but many other  chronic and acute conditions are known to elevate hsTnI results.  Refer to the "Links" section for chest pain algorithms and additional  guidance. Performed at [facility], [place] [address] [place], [place] [address]   CBC with Differential/Platelet     Status: Abnormal   Collection Time: 03/06/23  6:26 AM  Result Value Ref Range   WBC 7.2 4.0 - 10.5 K/uL   RBC 3.42 (L) 3.87 - 5.11 MIL/uL   Hemoglobin 12.9 12.0 - 15.0 g/dL   HCT 37.6 36.0 - 46.0 %   MCV 109.9 (H) 80.0 - 100.0 fL   MCH 37.7 (H) 26.0 - 34.0 pg   MCHC 34.3 30.0 - 36.0 g/dL   RDW 14.3 11.5 - 15.5 %   Platelets 123 (L) 150 - 400 K/uL   nRBC 0.7 (H) 0.0 - 0.2 %   Neutrophils Relative % 41 %   Neutro Abs 2.9 1.7 - 7.7 K/uL   Lymphocytes Relative 46 %   Lymphs Abs 3.4 0.7 - 4.0  K/uL   Monocytes Relative 8 %   Monocytes Absolute 0.6 0.1 - 1.0 K/uL   Eosinophils Relative 4 %   Eosinophils Absolute 0.3 0.0 - 0.5 K/uL   Basophils Relative 0 %   Basophils Absolute 0.0  0.0 - 0.1 K/uL   Immature Granulocytes 1 %   Abs Immature Granulocytes 0.04 0.00 - 0.07 K/uL    Comment: Performed at [facility], [place] [address] [place], [place] [address]  Ethanol     Status: Abnormal   Collection Time: 03/06/23  7:49 AM  Result Value Ref Range   Alcohol, Ethyl (B) 108 (H) <10 mg/dL    Comment: (NOTE) Lowest detectable limit for serum alcohol is 10 mg/dL.  For medical purposes only. Performed at [facility], [place] [address], [place], [place] [address]   Troponin I (High Sensitivity)     Status: None   Collection Time: 03/06/23  7:49 AM  Result Value Ref Range   Troponin I (High Sensitivity) 9 <18 ng/L    Comment: (NOTE) Elevated high sensitivity troponin I (hsTnI) values and significant  changes across serial measurements may suggest ACS but many other  chronic and acute conditions are known to elevate hsTnI results.  Refer to the "Links" section for chest pain algorithms and additional  guidance. Performed at [facility], [place] [address] [place], [place] [address]    CT CHEST ABDOMEN PELVIS W CONTRAST  Result Date: 03/06/2023 CLINICAL DATA:  Multiple recent falls.  Vomiting. EXAM: CT CHEST, ABDOMEN, AND PELVIS WITH CONTRAST TECHNIQUE: Multidetector CT imaging of the chest, abdomen and pelvis was performed following the standard protocol during bolus administration of intravenous contrast. RADIATION DOSE REDUCTION: This exam was performed according to the departmental dose-optimization program which includes automated exposure control, adjustment of the mA and/or kV according to patient size and/or use of iterative reconstruction technique. CONTRAST:  74mL OMNIPAQUE IOHEXOL 350 MG/ML SOLN COMPARISON:  Chest CT 02/02/2023.  Abdomen and pelvis CT 08/03/2022  FINDINGS: CT CHEST FINDINGS Cardiovascular: The heart size is normal. No substantial pericardial effusion. Advanced atherosclerotic calcification is noted in the wall of the thoracic aorta. Mediastinum/Nodes: No mediastinal lymphadenopathy. There is no hilar lymphadenopathy. The esophagus has normal imaging features. There is no axillary lymphadenopathy. Lungs/Pleura: Centrilobular and paraseptal emphysema evident. Biapical pleuroparenchymal scarring evident. No suspicious pulmonary nodule or mass. No focal airspace consolidation. No pleural effusion. Compressive atelectasis noted dependent lung bases, right greater than left. Musculoskeletal: No worrisome lytic or sclerotic osseous abnormality. CT ABDOMEN PELVIS FINDINGS Hepatobiliary: No suspicious focal abnormality within the liver parenchyma. There is no evidence for gallstones, gallbladder wall thickening, or pericholecystic fluid. No intrahepatic or extrahepatic biliary dilation. Pancreas: No focal mass lesion. No dilatation of the main duct. No intraparenchymal cyst. No peripancreatic edema. Spleen: Cystic lesion anterior spleen stable at 3.2 x 3.0 cm. Status post splenic artery embolization. Adrenals/Urinary Tract: Stable thickening of both adrenal glands. Tiny nonobstructing stones noted lower pole right kidney. Left kidney unremarkable. No evidence for hydroureter. The urinary bladder appears normal for the degree of distention. Stomach/Bowel: Tiny hiatal hernia. Stomach otherwise unremarkable. Duodenum is normally positioned as is the ligament of Treitz. 12 mm cystic lesion identified in the descending duodenum (73/3), potentially submucosal or intramural, stable since prior. No small bowel wall thickening. No small bowel dilatation. The terminal ileum is normal. The appendix is normal. No gross colonic mass. No colonic wall thickening. Mild left-sided diverticulosis without diverticulitis. Vascular/Lymphatic: Insert calcium aorta Severe There is no  gastrohepatic or hepatoduodenal ligament lymphadenopathy. No retroperitoneal or mesenteric lymphadenopathy. No pelvic sidewall lymphadenopathy. Reproductive: Hysterectomy.  There is no adnexal mass. Other: No intraperitoneal free fluid. Musculoskeletal: No worrisome lytic or sclerotic osseous abnormality. Status post right SI joint fusion  IMPRESSION: 1. No acute findings in the chest, abdomen, or pelvis. 2. Stable 12 mm cystic lesion in the descending duodenum, potentially submucosal or intramural. Stable since 08/03/2022, this is new since 11/01/2018. Upper endoscopy could be used to further evaluate as clinically warranted. 3. Hiatal hernia. 4. Mild left-sided diverticulosis without diverticulitis. 5. Tiny nonobstructing right renal stones. 6. Aortic Atherosclerosis (ICD10-I70.0) and Emphysema (ICD10-J43.9). Electronically Signed   By: Misty Stanley M.D.   On: 03/06/2023 09:25   CT Head Wo Contrast  Result Date: 03/06/2023 CLINICAL DATA:  Head trauma, minor (Age >= 65y); Neck trauma (Age >= 65y) EXAM: CT HEAD WITHOUT CONTRAST CT CERVICAL SPINE WITHOUT CONTRAST TECHNIQUE: Multidetector CT imaging of the head and cervical spine was performed following the standard protocol without intravenous contrast. Multiplanar CT image reconstructions of the cervical spine were also generated. RADIATION DOSE REDUCTION: This exam was performed according to the departmental dose-optimization program which includes automated exposure control, adjustment of the mA and/or kV according to patient size and/or use of iterative reconstruction technique. COMPARISON:  01/04/2020 FINDINGS: CT HEAD FINDINGS Brain: No evidence of acute infarction, hemorrhage, hydrocephalus, extra-axial collection or mass lesion/mass effect. Vascular: Atherosclerotic calcifications involving the large vessels of the skull base. No unexpected hyperdense vessel. Skull: Normal. Negative for fracture or focal lesion. Sinuses/Orbits: Mild diffuse paranasal  sinus mucosal thickening. No air-fluid levels. Other: Small anterior left frontal scalp hematoma. CT CERVICAL SPINE FINDINGS Alignment: Facet joints are aligned without dislocation or traumatic listhesis. Dens and lateral masses are aligned. Exaggerated cervical lordosis, which may be positional. Skull base and vertebrae: No acute fracture. No primary bone lesion or focal pathologic process. Soft tissues and spinal canal: No prevertebral fluid or swelling. No visible canal hematoma. Disc levels: Intervertebral disc heights are preserved. Moderate multilevel cervical facet joint arthropathy. Upper chest: Biapical pleuroparenchymal scarring. Other: Bilateral carotid atherosclerosis. IMPRESSION: 1. No acute intracranial abnormality. 2. Small anterior left frontal scalp hematoma. No underlying calvarial fracture. 3. No acute fracture or subluxation of the cervical spine. Electronically Signed   By: Davina Poke D.O.   On: 03/06/2023 09:19   CT Cervical Spine Wo Contrast  Result Date: 03/06/2023 CLINICAL DATA:  Head trauma, minor (Age >= 65y); Neck trauma (Age >= 65y) EXAM: CT HEAD WITHOUT CONTRAST CT CERVICAL SPINE WITHOUT CONTRAST TECHNIQUE: Multidetector CT imaging of the head and cervical spine was performed following the standard protocol without intravenous contrast. Multiplanar CT image reconstructions of the cervical spine were also generated. RADIATION DOSE REDUCTION: This exam was performed according to the departmental dose-optimization program which includes automated exposure control, adjustment of the mA and/or kV according to patient size and/or use of iterative reconstruction technique. COMPARISON:  01/04/2020 FINDINGS: CT HEAD FINDINGS Brain: No evidence of acute infarction, hemorrhage, hydrocephalus, extra-axial collection or mass lesion/mass effect. Vascular: Atherosclerotic calcifications involving the large vessels of the skull base. No unexpected hyperdense vessel. Skull: Normal. Negative  for fracture or focal lesion. Sinuses/Orbits: Mild diffuse paranasal sinus mucosal thickening. No air-fluid levels. Other: Small anterior left frontal scalp hematoma. CT CERVICAL SPINE FINDINGS Alignment: Facet joints are aligned without dislocation or traumatic listhesis. Dens and lateral masses are aligned. Exaggerated cervical lordosis, which may be positional. Skull base and vertebrae: No acute fracture. No primary bone lesion or focal pathologic process. Soft tissues and spinal canal: No prevertebral fluid or swelling. No visible canal hematoma. Disc levels: Intervertebral disc heights are preserved. Moderate multilevel cervical facet joint arthropathy. Upper chest: Biapical pleuroparenchymal scarring. Other: Bilateral carotid atherosclerosis. IMPRESSION: 1.  No acute intracranial abnormality. 2. Small anterior left frontal scalp hematoma. No underlying calvarial fracture. 3. No acute fracture or subluxation of the cervical spine. Electronically Signed   By: Davina Poke D.O.   On: 03/06/2023 09:19   DG Chest Port 1 View  Result Date: 03/06/2023 CLINICAL DATA:  67 year old female with chest pain, epigastric pain, 1 episode of vomiting. Multiple recent falls. Smoker. EXAM: PORTABLE CHEST 1 VIEW COMPARISON:  Screening chest CT 02/02/2023 and earlier. FINDINGS: Portable AP semi upright view at 0616 hours. Pulmonary hyperinflation demonstrated by CT last month, lower lung volumes today. Kyphotic positioning. Mediastinal contours are stable and within normal limits. Allowing for portable technique the lungs are clear. No pneumothorax or pleural effusion. No acute osseous abnormality identified. Bilateral axillary artery calcified atherosclerosis. Paucity bowel gas in the upper abdomen. IMPRESSION: No acute cardiopulmonary abnormality. Chronic pulmonary hyperinflation. Electronically Signed   By: Genevie Ann M.D.   On: 03/06/2023 06:40    Pending Labs Unresulted Labs (From admission, onward)     Start      Ordered   03/06/23 1224  Rapid urine drug screen (hospital performed)  ONCE - STAT,   STAT        03/06/23 1223   03/06/23 1224  Hepatitis panel, acute  Once,   URGENT        03/06/23 1223   03/06/23 1222  Vitamin B12  Once,   URGENT        03/06/23 1221   03/06/23 1222  Folate  Once,   URGENT        03/06/23 1221   03/06/23 1222  Magnesium  Once,   STAT        03/06/23 1221   03/06/23 1220  Brain natriuretic peptide  Once,   URGENT        03/06/23 1219   03/06/23 0614  CBC with Differential  Once,   STAT        03/06/23 0614            Vitals/Pain Today's Vitals   03/06/23 1100 03/06/23 1140 03/06/23 1158 03/06/23 1200  BP: 123/70   118/69  Pulse: (!) 106   89  Resp: 18   18  Temp:   (!) 97.3 F (36.3 C)   TempSrc:   Oral   SpO2: 92% 94%  95%  Weight:      Height:      PainSc:        Isolation Precautions No active isolations  Medications Medications  LORazepam (ATIVAN) tablet 1-4 mg (has no administration in time range)    Or  LORazepam (ATIVAN) tablet 0.5 mg (has no administration in time range)  thiamine (VITAMIN B1) tablet 100 mg (has no administration in time range)    Or  thiamine (VITAMIN B1) injection 100 mg (has no administration in time range)  folic acid (FOLVITE) tablet 1 mg (has no administration in time range)  multivitamin with minerals tablet 1 tablet (has no administration in time range)  iohexol (OMNIPAQUE) 350 MG/ML injection 75 mL (75 mLs Intravenous Contrast Given 03/06/23 0856)  acetaminophen (TYLENOL) tablet 1,000 mg (1,000 mg Oral Given 03/06/23 1107)  ipratropium-albuterol (DUONEB) 0.5-2.5 (3) MG/3ML nebulizer solution 3 mL (3 mLs Nebulization Given 03/06/23 1140)  predniSONE (DELTASONE) tablet 60 mg (60 mg Oral Given 03/06/23 1140)    Mobility walks with person assist     Focused Assessments Pulmonary Assessment Handoff:  Lung sounds:   O2 Device: Nasal Cannula O2 Flow Rate (  L/min): 2 L/min    R Recommendations: See  Admitting Provider Note  Report given to:   Additional Notes:

## 2023-03-06 NOTE — Assessment & Plan Note (Signed)
Multiple falls at home she contributes to neuropathy ? If alcohol playing a roll PT/OT to see

## 2023-03-07 ENCOUNTER — Observation Stay (HOSPITAL_BASED_OUTPATIENT_CLINIC_OR_DEPARTMENT_OTHER): Payer: Medicare HMO

## 2023-03-07 DIAGNOSIS — R296 Repeated falls: Secondary | ICD-10-CM

## 2023-03-07 DIAGNOSIS — Z720 Tobacco use: Secondary | ICD-10-CM

## 2023-03-07 DIAGNOSIS — I272 Pulmonary hypertension, unspecified: Secondary | ICD-10-CM | POA: Diagnosis not present

## 2023-03-07 DIAGNOSIS — F1010 Alcohol abuse, uncomplicated: Secondary | ICD-10-CM | POA: Diagnosis not present

## 2023-03-07 DIAGNOSIS — R7401 Elevation of levels of liver transaminase levels: Secondary | ICD-10-CM

## 2023-03-07 DIAGNOSIS — F329 Major depressive disorder, single episode, unspecified: Secondary | ICD-10-CM

## 2023-03-07 DIAGNOSIS — M940 Chondrocostal junction syndrome [Tietze]: Secondary | ICD-10-CM | POA: Diagnosis not present

## 2023-03-07 DIAGNOSIS — J9601 Acute respiratory failure with hypoxia: Secondary | ICD-10-CM | POA: Diagnosis not present

## 2023-03-07 DIAGNOSIS — F1994 Other psychoactive substance use, unspecified with psychoactive substance-induced mood disorder: Secondary | ICD-10-CM

## 2023-03-07 DIAGNOSIS — E876 Hypokalemia: Secondary | ICD-10-CM | POA: Diagnosis not present

## 2023-03-07 DIAGNOSIS — F411 Generalized anxiety disorder: Secondary | ICD-10-CM

## 2023-03-07 LAB — COMPREHENSIVE METABOLIC PANEL
ALT: 40 U/L (ref 0–44)
AST: 26 U/L (ref 15–41)
Albumin: 3 g/dL — ABNORMAL LOW (ref 3.5–5.0)
Alkaline Phosphatase: 70 U/L (ref 38–126)
Anion gap: 7 (ref 5–15)
BUN: 14 mg/dL (ref 8–23)
CO2: 26 mmol/L (ref 22–32)
Calcium: 7.9 mg/dL — ABNORMAL LOW (ref 8.9–10.3)
Chloride: 106 mmol/L (ref 98–111)
Creatinine, Ser: 0.71 mg/dL (ref 0.44–1.00)
GFR, Estimated: >60 mL/min (ref >60)
Glucose, Bld: 99 mg/dL (ref 70–99)
Potassium: 3.7 mmol/L (ref 3.5–5.1)
Sodium: 139 mmol/L (ref 135–145)
Total Bilirubin: 0.7 mg/dL (ref 0.3–1.2)
Total Protein: 5.4 g/dL — ABNORMAL LOW (ref 6.5–8.1)

## 2023-03-07 LAB — CBC
HCT: 31.1 % — ABNORMAL LOW (ref 36.0–46.0)
Hemoglobin: 10.5 g/dL — ABNORMAL LOW (ref 12.0–15.0)
MCH: 37.5 pg — ABNORMAL HIGH (ref 26.0–34.0)
MCHC: 33.8 g/dL (ref 30.0–36.0)
MCV: 111.1 fL — ABNORMAL HIGH (ref 80.0–100.0)
Platelets: 100 10*3/uL — ABNORMAL LOW (ref 150–400)
RBC: 2.8 MIL/uL — ABNORMAL LOW (ref 3.87–5.11)
RDW: 14.2 % (ref 11.5–15.5)
WBC: 6.1 10*3/uL (ref 4.0–10.5)
nRBC: 0 % (ref 0.0–0.2)

## 2023-03-07 LAB — ECHOCARDIOGRAM COMPLETE
AV Mean grad: 4 mmHg
AV Peak grad: 7.5 mmHg
Ao pk vel: 1.37 m/s
Area-P 1/2: 5.13 cm2
Height: 66 in
Weight: 1952 oz

## 2023-03-07 LAB — RAPID URINE DRUG SCREEN, HOSP PERFORMED
Amphetamines: NOT DETECTED
Barbiturates: NOT DETECTED
Benzodiazepines: POSITIVE — AB
Cocaine: NOT DETECTED
Opiates: NOT DETECTED
Tetrahydrocannabinol: NOT DETECTED

## 2023-03-07 LAB — MAGNESIUM: Magnesium: 1.7 mg/dL (ref 1.7–2.4)

## 2023-03-07 MED ORDER — PREDNISONE 10 MG PO TABS: ORAL_TABLET | ORAL | 0 refills | Status: DC

## 2023-03-07 MED ORDER — IPRATROPIUM-ALBUTEROL 0.5-2.5 (3) MG/3ML IN SOLN: 3.0000 mL | Freq: Four times a day (QID) | RESPIRATORY_TRACT | 1 refills | Status: AC | PRN

## 2023-03-07 NOTE — TOC Transition Note (Addendum)
Transition of Care [facility]) - CM/SW Discharge Note   Patient Details  Name: Monique Perry MRN: [id number] Date of Birth: Dec 25, 1955  [facility]) CM/SW Contact:  Tom-Johnson, Renea Ee, RN Phone Number: 03/07/2023, 4:36 PM   Clinical Narrative:     Patient is scheduled for discharge today. Hospital f/u and discharge instructions on AVS.   Home health recommended, patient states she has no preference. CM called in referral to [place] and [facility] voiced acceptance, info on AVS.   [place] Waiver and release of Liability explained to patient with understanding verbalized and patient signed form which is placed in her chart. Cab voucher given to RN. No further TOC needs noted  Final next level of care: Home w Home Health Services Barriers to Discharge: Continued Medical Work up   Patient Goals and CMS Choice CMS Medicare.gov Compare Post Acute Care list provided to:: Patient Choice offered to / list presented to : Patient  Discharge Placement                  Patient to be transferred to facility by: [facility]      Discharge Plan and Services Additional resources added to the After Visit Summary for                  DME Arranged: N/A DME Agency: NA       HH Arranged: PT, OT, Nurse's Aide [place] Agency: [place] Date [facility] Agency Contacted: 03/07/23 Time HH Agency Contacted: 1550 Representative spoke with at [place]: [place] Determinants of Health ([place]) Interventions SDOH Screenings   Food Insecurity: No Food Insecurity (03/06/2023)  Housing: Low Risk  (03/06/2023)  Transportation Needs: No Transportation Needs (03/06/2023)  Utilities: Not At Risk (03/06/2023)  Alcohol Screen: Low Risk  (08/28/2020)  Depression (PHQ2-9): Low Risk  (08/28/2020)  Financial Resource Strain: Low Risk  (08/28/2020)  Physical Activity: Inactive (08/28/2020)  Social Connections: Moderately Isolated (08/28/2020)  Stress: No Stress Concern Present (08/28/2020)   Tobacco Use: High Risk (03/06/2023)     Readmission Risk Interventions     No data to display

## 2023-03-07 NOTE — Consult Note (Signed)
[facility] New Face-to-Face Psychiatric Evaluation   Service Date: March 07, 2023 LOS:  LOS: 0 days    Assessment  Monique Perry is a 67 y.o. female admitted medically for 03/06/2023  5:56 AM for  acute respiratory failure with hypoxia . She carries the psychiatric diagnoses of MDD, GAD, ?bipolar disorder, tobacco use disorder, and alcohol use disorder and has a past medical history of peripheral neuropathy, GERD, hyperlipidemia, osteopenia. Psychiatry was consulted for "bipolar dx" by Dr. Orma Flaming.    Based on patient's assessment, chart review, and reported psychiatric history, she does not meet criteria for bipolar disorder.  There is some concern related to her concomitant use of Xanax and alcohol use which we warned patient about but will defer to PCP regarding management of this. We also have advised patient to consider restarting outpatient therapy to aid with depression and anxiety.  Regarding medications, advised patient to discuss with PCP restarting gabapentin for neuropathy/alcohol use disorder/GAD and/or using Lamictal as an adjunct for her depression along with her mirtazapine.  Diagnoses:  Active Hospital problems: Principal Problem:   Acute respiratory failure with hypoxia (HCC) Active Problems:   GERD (gastroesophageal reflux disease)   Tobacco abuse   Alcohol abuse   Hyperlipidemia   Osteopenia   Elevated transaminase level   Peripheral neuropathy   Hypokalemia   Chronic abdominal pain   Frequent falls   Costochondritis     Plan  ## Safety and Observation Level:  - Based on my clinical evaluation, I estimate the patient to be at low risk of self harm in the current setting   #Alcohol use Disorder #Substance induced mood Disorder #Major Depressive Disorder #Generalized Anxiety Disorder #Tobacco Use Disorder  -Agree with thiamine supplementation and CIWA -Continue mirtazapine 30 mg nightly -Continue Xanax 0.5 twice daily as needed  for anxiety  -Recommend tapering given she continues to drink alcohol and risk for respiratory depression, dementia, increased falls -Consider restarting gabapentin for neuropathy, alcohol use disorder, and generalized anxiety disorder -Consider starting lamictal as an adjunct for depression -Advise smoking and alcohol cessation -Does not meet inpatient psychiatry or IVC criteria  ## Medical Decision Making Capacity:  -not formally assessed  ## Further Work-up:   ## Labwork -- CT head seems to have atrophy of white matter although no tmentioned in radioology report.  -- most recent EKG on 03/06/23 had QtC of 450 -- Pertinent labwork reviewed earlier this admission includes: TSH wnl, HIV nonreactive, hepatitis nonreactive, magnesium low, folate wnl, b12 high, EtOH on admission 108, MCV 109, MCH also elevated, platelets 100  - EtOH use may explain elevated MCV  - Thrombocytopenia may be dilutional  ## Disposition:  -- per primary   Thank you for this consult request. Recommendations have been communicated to the primary team.  We will sign off in anticipation of upcoming dc at this time. She should follow up with her established outpatient care.   Monique Ravens, MD Psychiatry Resident, PGY2  NEW history  Relevant Aspects of Hospital Course:  Admitted on 03/06/2023 for acute hypoxemic respiratory failure with hypoxia.  Patient Report:  Pt seen in AM. Alert and oriented to self, location, situation, time, (not aware of date, guessed 25 or 26). Knows president, DOWB no issue, and able to manipulate money in head easily. Patient was not aware psychiatry was coming to see her. Patient makes jokes appropriate to situation throughout exam.   We discussed historic bipolar diagnosis - pt has had no manic episodes for  at least the last several years. She reports having had episodes of being a "wild child" where she would go multiple days without sleep while high. She does not remember this ever  happening while not on substances. Recently she has had more depressive episodes. Primary stressor appears to be recent losses including that of mother (who was in her 78s) and her own neuropathy. Dealing with loss and grief. She speaks with church members and her pastor regularly (pastor does home visits) which has been a blessing for her. She has done formal therapy which she found less helpful than the routine pastor visits. She does report continuing to feel depressed, low energy, poor sleep, poor appetite, and occasional feelings of hopelessness. She denies present suicidal ideation/homicidal ideation.  Patient states she is able to contract for safety and would speak with her pastor or a few of her female friends should she begin to have worsening suicidal ideations.    Limitations imposed by neuropathy has significantly reduced her quality of life. She reports that she sleeps during the day because neuropathy worst at night. Prev on gabapentin to partial efficacy. She was switched to lyrica which caused wt gain and was only partially effective. She quit driving about 3 years ago because she didn't feel she was safe on the road.   Patient takes Xanax every day in the AM, second dose is PRN. She states she avoids mixing xanax and EtOH. Went Jan-early Mar w/o drinking.  We discussed potentially retrying gabapentin in setting of neuropathy, EtOH use Discussed lamictal as oupt option.    Patient is hoping she will go home today.   Future oriented throughout conversation as evidenced by wanting to care for cat.  ROS:  Sleeps - 2-4 hrs/day, issues with initiation, maintenance, and early wakening due to frequent need to urinate. Fairly poor sleep hygeine.   A couple of lifetime hallucinations - when overwhelmed with "everything going on" - sounds like she was "always on edge" and jumping at shadows as she was caring for her ailing parents.   Denies present SI/HI/AVH.  Psychiatric History:   Information collected from chart, pt  Pt currently filling following medications:  Alprazolam 0.5 BID Mirtazapine 30 mg qD  (feels responsible for the sleep she does get)   Recent meds (unclear if current by fill hx) Amitriptyline 75 mg - not taking (conscious decision)  Buproprion 150 mg XL - not taking (conscious decision)  Old meds: Filled seroquel in 2017  Rehab hx: previously gone to AA.   Family psych history: none  No hx psych hospitalization.    Social History:  Divorced husband around age 63. No kids. Pets are family.  Former crack cocaine use but has been abstinent for ~26 years. No hx IVDU. Drank for a couple of decades after this. Does not consider self addicted to EtOH - has cut back. Tobacco use: yes Alcohol use: intermittent heavy drinking Drug use: no  Family History:  The patient's family history includes Breast cancer (age of onset: 71) in her sister; Diabetes in her father; Hyperlipidemia in her father; Hypertension in her father; Stroke in her mother.  Medical History: Past Medical History:  Diagnosis Date   Alcohol abuse last February 2012   Arthritis    Bipolar disorder ([place])    Collagenous colitis    Drug abuse ([place]) last 1990's   Peripheral neuropathy 11/29/2018   Pneumonia     Surgical History: Past Surgical History:  Procedure Laterality Date   ABDOMINAL HYSTERECTOMY  Age 84 years ago   partial    FOOT SURGERY     FRACTURE SURGERY     IR GENERIC HISTORICAL  07/16/2016   IR US GUIDE VASC ACCESS RIGHT 07/16/2016 Arne Cleveland, MD [facility]   IR GENERIC HISTORICAL  07/16/2016   IR ANGIOGRAM VISCERAL SELECTIVE 07/16/2016 Arne Cleveland, MD [facility]   IR GENERIC HISTORICAL  07/16/2016   IR ANGIOGRAM SELECTIVE EACH ADDITIONAL VESSEL 07/16/2016 Arne Cleveland, MD [facility]   IR GENERIC HISTORICAL  07/16/2016   IR EMBO ART  VEN HEMORR LYMPH EXTRAV  INC GUIDE ROADMAPPING 07/16/2016 Arne Cleveland, MD [facility]   IR GENERIC  HISTORICAL  07/16/2016   IR ANGIOGRAM FOLLOW UP STUDY 07/16/2016 Arne Cleveland, MD [facility]   Lapidus fusion Right 04/17/2012   NOSE SURGERY     ORIF FIBULA FRACTURE Right 06/17/2020   Procedure: OPERATIVE TREATMENT OF RIGHT TIBIA AND FIBULA FRACTURE;  Surgeon: Erle Crocker, MD;  Location: [facility];  Service: Orthopedics;  Laterality: Right;  LENGTH OF SURGERY 2HOURS   OSTEOTOMY Right 04/17/2012   Rt #2 Metatarsal   SACROILIAC JOINT FUSION Right 06/10/2021   Procedure: RIGHT SACROILIAC JOINT FUSION;  Surgeon: Phylliss Bob, MD;  Location: [facility];  Service: Orthopedics;  Laterality: Right;   TONSILLECTOMY  As a child    WRIST SURGERY      Medications:   Current Facility-Administered Medications:    acetaminophen (TYLENOL) tablet 650 mg, 650 mg, Oral, Q6H PRN, 650 mg at 03/07/23 0504 **OR** acetaminophen (TYLENOL) suppository 650 mg, 650 mg, Rectal, Q6H PRN, Orma Flaming, MD   albuterol (PROVENTIL) (2.5 MG/3ML) 0.083% nebulizer solution 2.5 mg, 2.5 mg, Nebulization, Q2H PRN, Orma Flaming, MD   ALPRAZolam Duanne Moron) tablet 0.5 mg, 0.5 mg, Oral, BID PRN, Orma Flaming, MD, 0.5 mg at 03/06/23 2216   dicyclomine (BENTYL) tablet 20 mg, 20 mg, Oral, Q6H PRN, Orma Flaming, MD   enoxaparin (LOVENOX) injection 40 mg, 40 mg, Subcutaneous, Q24H, Orma Flaming, MD, 40 mg at [phone] [id number]   folic acid (FOLVITE) tablet 1 mg, 1 mg, Oral, Daily, Orma Flaming, MD, 1 mg at 03/07/23 0849   ipratropium-albuterol (DUONEB) 0.5-2.5 (3) MG/3ML nebulizer solution 3 mL, 3 mL, Nebulization, Q6H, Orma Flaming, MD, 3 mL at 03/07/23 0239   LORazepam (ATIVAN) tablet 1-4 mg, 1-4 mg, Oral, Q1H PRN, 2 mg at 03/07/23 0521 **OR** LORazepam (ATIVAN) tablet 0.5 mg, 0.5 mg, Oral, Q1H PRN, Orma Flaming, MD   metoprolol succinate (TOPROL-XL) 24 hr tablet 25 mg, 25 mg, Oral, Daily, Orma Flaming, MD, 25 mg at 03/07/23 0850   mirtazapine (REMERON) tablet 30 mg, 30 mg, Oral, QHS, Orma Flaming, MD, 30 mg at  03/06/23 2215   multivitamin with minerals tablet 1 tablet, 1 tablet, Oral, Daily, Orma Flaming, MD, 1 tablet at 03/07/23 0850   ondansetron (ZOFRAN) tablet 4 mg, 4 mg, Oral, Q6H PRN **OR** ondansetron (ZOFRAN) injection 4 mg, 4 mg, Intravenous, Q6H PRN, Orma Flaming, MD   pantoprazole (PROTONIX) EC tablet 40 mg, 40 mg, Oral, Daily, Orma Flaming, MD, 40 mg at 03/07/23 0850   predniSONE (DELTASONE) tablet 40 mg, 40 mg, Oral, Q breakfast, Orma Flaming, MD, 40 mg at 03/07/23 0850   rosuvastatin (CRESTOR) tablet 5 mg, 5 mg, Oral, Daily, Orma Flaming, MD, 5 mg at 03/07/23 [id number]   thiamine (VITAMIN B1) tablet 100 mg, 100 mg, Oral, Daily, 100 mg at 03/07/23 0850 **OR** thiamine (VITAMIN B1) injection 100 mg, 100 mg, Intravenous, Daily, Orma Flaming, MD  Allergies: Allergies  Allergen Reactions   Erythrocin Other (See Comments)    Thrush   Erythromycin Other (See Comments)    thrush   Sulfa Antibiotics Other (See Comments)    Thrush    Trazodone And Nefazodone Other (See Comments)    Severe insomnia   Tramadol Itching       Objective  Vital signs:  Temp:  [97.3 F (36.3 C)-98.5 F (36.9 C)] 97.8 F (36.6 C) (03/25 0848) Pulse Rate:  [64-100] 75 (03/25 0848) Resp:  [18] 18 (03/25 0848) BP: (118-143)/(63-93) 124/63 (03/25 0848) SpO2:  [90 %-99 %] 98 % (03/25 0848)  Psychiatric Specialty Exam:  Presentation  General Appearance: Appropriate for Environment; Casual Elderly caucasian female, appears stated age in hospital gown  Eye Contact:Good  Speech:Clear and Coherent; Normal Rate  Speech Volume:Normal  Mood and Affect  Mood:Euthymic  Affect:Appropriate; Congruent   Thought Process  Thought Processes:Coherent; Goal Directed; Linear  Descriptions of Associations:Circumstantial  Orientation:Full (Time, Place and Person)  Thought Content:Logical  Hallucinations:Hallucinations: None  Ideas of Reference:None  Suicidal Thoughts:Suicidal Thoughts:  No  Homicidal Thoughts:Homicidal Thoughts: No   Sensorium  Memory:Remote Good  Judgment:Fair  Insight:Fair   Executive Functions  Concentration:Fair  Attention Span:Fair  [place]   Psychomotor Activity  Psychomotor Activity:Psychomotor Activity: Normal   Assets  Assets:Communication Skills; Housing; Leisure Time; Physical Health; Resilience; Social Support; Talents/Skills; Transportation   Sleep  Sleep:Sleep: Poor     Physical Exam Constitutional:      General: She is not in acute distress.    Appearance: She is not ill-appearing.  HENT:     Head: Normocephalic and atraumatic.  Eyes:     Extraocular Movements: Extraocular movements intact.  Cardiovascular:     Rate and Rhythm: Normal rate.     Blood pressure 124/63, pulse 75, temperature 97.8 F (36.6 C), temperature source Oral, resp. rate 18, height 5\' 6"  (1.676 m), weight 55.3 kg, SpO2 98 %. Body mass index is 19.69 kg/m.

## 2023-03-07 NOTE — Plan of Care (Signed)

## 2023-03-07 NOTE — Care Management Obs Status (Signed)
[place] NOTIFICATION   Patient Details  Name: Monique Perry MRN: [id number] Date of Birth: October 08, 1956   Medicare Observation Status Notification Given:  Yes    Tom-Johnson, Renea Ee, RN 03/07/2023, 4:46 PM

## 2023-03-07 NOTE — Evaluation (Signed)
Physical Therapy Evaluation Patient Details Name: Monique Perry MRN: [id number] DOB: September 10, 1956 Today's Date: 03/07/2023  History of Present Illness  Patient is a 67 y/o female who presents on 3/24 with abdominal and chest pain as well as emesis and falls. Found to have acute respiratory failure with hypoxia. PMH includes neuropathy, bipolar disorder, ETOH abuse, colitis, drug abuse.  Clinical Impression  Patient presents with neuropathy impacting overall balance and mobility s/p above. Pt lives home alone and reports being independent for ADLs at baseline. Pt is a furniture walker at baseline but uses RW for community ambulation. Has hx of falls due to neuropathy. Encouraged using RW for better support and to decrease fall risk at home. Due to poor proprioception and balance, would benefit from [facility] aide to assist with IADls. MD/CM made aware. Tolerated ambulation with Min guard-supervision for safety with use of RW for support. VSS on RA throughout activity. Will follow acutely to maximize independence and mobility prior to return home.       Recommendations for follow up therapy are one component of a multi-disciplinary discharge planning process, led by the attending physician.  Recommendations may be updated based on patient status, additional functional criteria and insurance authorization.  Follow Up Recommendations       Assistance Recommended at Discharge PRN  Patient can return home with the following  Assistance with cooking/housework;Assist for transportation;A little help with walking and/or transfers    Equipment Recommendations None recommended by PT  Recommendations for Other Services       Functional Status Assessment Patient has had a recent decline in their functional status and demonstrates the ability to make significant improvements in function in a reasonable and predictable amount of time.     Precautions / Restrictions Precautions Precautions: Fall Precaution  Comments: multiple falls due to neuropathy per report Restrictions Weight Bearing Restrictions: No      Mobility  Bed Mobility Overal bed mobility: Modified Independent             General bed mobility comments: No assist needed.    Transfers Overall transfer level: Needs assistance Equipment used: Rolling walker (2 wheels) Transfers: Sit to/from Stand Sit to Stand: Supervision           General transfer comment: Supervision for safety. Stood from [employer].    Ambulation/Gait Ambulation/Gait assistance: Min guard, Supervision Gait Distance (Feet): 200 Feet Assistive device: Rolling walker (2 wheels) Gait Pattern/deviations: Step-through pattern, Decreased stride length, Trunk flexed Gait velocity: decreased     General Gait Details: SLow, mildly unsteady gait with flexed trunk, cues for upright posture and RW proximity.  Stairs            Wheelchair Mobility    Modified Rankin (Stroke Patients Only)       Balance Overall balance assessment: Needs assistance Sitting-balance support: Feet supported, No upper extremity supported Sitting balance-Leahy Scale: Good     Standing balance support: During functional activity Standing balance-Leahy Scale: Fair Standing balance comment: Can stand statically without UE support, needs UE support for walking                             Pertinent Vitals/Pain Pain Assessment Pain Assessment: Faces Faces Pain Scale: Hurts a little bit Pain Location: BLEs Pain Descriptors / Indicators: Burning, Tingling Pain Intervention(s): Monitored during session, Repositioned    Home Living Family/patient expects to be discharged to:: Private residence Living Arrangements: Alone Available Help at  Discharge: Available PRN/intermittently;Friend(s);Family Type of Home: Apartment Home Access: Level entry       Home Layout: One level Home Equipment: Shower seat;Hand held shower head;Standard Engineer, manufacturing (2 wheels);Wheelchair - Education administrator (comment) Additional Comments: bed rails on bed; has CNA that lives across the hall that checks on her, owns a lift chair    Prior Function Prior Level of Function : Independent/Modified Independent             Mobility Comments: furniture walker at baseline, uses ride share or family to assist with driving. Uses RW for community ambulation ADLs Comments: independent     Hand Dominance   Dominant Hand: Right    Extremity/Trunk Assessment   Upper Extremity Assessment Upper Extremity Assessment: Defer to OT evaluation    Lower Extremity Assessment Lower Extremity Assessment: RLE deficits/detail;LLE deficits/detail RLE Sensation: history of peripheral neuropathy;decreased proprioception;decreased light touch LLE Sensation: history of peripheral neuropathy;decreased proprioception;decreased light touch       Communication   Communication: No difficulties  Cognition Arousal/Alertness: Awake/alert Behavior During Therapy: WFL for tasks assessed/performed Overall Cognitive Status: Impaired/Different from baseline Area of Impairment: Safety/judgement                         Safety/Judgement: Decreased awareness of safety     General Comments: Despite multiple falls at home, prefers to not use RW or UE support in the home, furniture walker at baseline        General Comments      Exercises     Assessment/Plan    PT Assessment Patient needs continued PT services  PT Problem List Decreased strength;Decreased mobility;Decreased balance;Impaired sensation;Pain;Decreased knowledge of use of DME;Decreased safety awareness       PT Treatment Interventions Therapeutic activities;Gait training;Therapeutic exercise;Patient/family education;Balance training;Functional mobility training    PT Goals (Current goals can be found in the Care Plan section)  Acute Rehab PT Goals Patient Stated Goal: to go home PT Goal  Formulation: With patient Time For Goal Achievement: 03/21/23 Potential to Achieve Goals: Good    Frequency Min 3X/week     Co-evaluation               AM-PAC PT "6 Clicks" Mobility  Outcome Measure Help needed turning from your back to your side while in a flat bed without using bedrails?: None Help needed moving from lying on your back to sitting on the side of a flat bed without using bedrails?: None Help needed moving to and from a bed to a chair (including a wheelchair)?: A Little Help needed standing up from a chair using your arms (e.g., wheelchair or bedside chair)?: A Little Help needed to walk in hospital room?: A Little Help needed climbing 3-5 steps with a railing? : A Little 6 Click Score: 20    End of Session Equipment Utilized During Treatment: Gait belt Activity Tolerance: Patient tolerated treatment well Patient left: in bed;with call bell/phone within reach;with bed alarm set Nurse Communication: Mobility status PT Visit Diagnosis: Muscle weakness (generalized) (M62.81);Unsteadiness on feet (R26.81);Difficulty in walking, not elsewhere classified (R26.2)    Time: [id number] PT Time Calculation (min) (ACUTE ONLY): 28 min   Charges:   PT Evaluation $PT Eval Low Complexity: 1 Low PT Treatments $Gait Training: 8-22 mins        Zettie Cooley, DPT Acute Rehabilitation Services Secure chat preferred Office [place] 03/07/2023, 2:49 PM

## 2023-03-07 NOTE — Discharge Instructions (Addendum)
Psychiatry Please follow up with your PCP/Psychiatrist regarding the following -Tapering alprazolam given fall risk -Restarting gabapentin to aid with anxiety, neuropathy, and alcohol use -Starting lamotrigine as an adjunct to mirtazapine to aid with depression -Referral for psychotherapy    Outpatient Providers   [facility] Group and individual counseling. [address]  [place], [place] [address] [phone] [place]: [phone]  [place]: [phone] Medicaid and uninsured.   [facility] groups multiple times per week. [place], [place], [place] [address] [phone] Takes Medicaid and other insurances.   [facility] (IOP) [address] [place], [place] [address] [phone] Takes Pharmacist, community and [place].   [facility]  IOP and Partial Hospitalization Program  [place].  [place], [place] [address] [phone] Private Insurance, [place] only for partial hospitalization     [facility] ([place] IOP, individual counseling, medication management [place], [place] [address] [phone] Medicaid and [facility]  [place] [address]  [place], [place] [address] [phone] Private Insurance and [facility] [address]  [place], [place] [address] [phone] Private Insurance, [place], and Self Pay   [facility]  [place], [place] [address] [facility]  [address]  [place], [place] [address] [phone]  [facility]  [address] [place], [place] [address] [phone]       Residential Treatment Programs  [facility] ([place] [place], [place] [address] [phone]  or [phone] Detox and Residential Rehab 14 days (Medicare, Medicaid, private insurance, and self pay)  [facility] Services [place], [place] [address] [phone] Detox (self Pay and Medicaid Limited availability) Rehab Only Female (Medicare, [place], and Self Pay)  [facility] [address] [place], [place] [address] [phone] or [phone] Private Insurance only  [place]  [place]  [place], [place] [address] [phone] Treatment Only, must make assessment appointment, and must be sober for assessment appointment. Self pay, Medicare A and B, [facility], must be [facility] resident.      Residential Treatment [place] , [place]  [phone] [employer], [employer], [place]. They offer assistance with transportation.   [facility] [address] [place],  [place], [place] [address] [phone] [facility] No insurance     [place], [place] [address] [phone] No pending legal charges, Long-term work program  [place]: [facility]  [place], [place] [address] [address] [place], [place] [address] Residential treatment (takes people on Methadone/Suboxone)  Medicaid and uninsured  [facility]  [address] [place], [place] [address] [phone] or [phone] [employer] Insurance Only [place] [phone] Private Insurance Males/Females, call to make referrals, multiple facilities   [facility] [address],  [place], [place] [address]  [phone] Men Only Upfront Fee [facility] [address]      [facility] Women[facility]: [facility] [address] [place], [place] [address] [phone]  [facility] [place],  [place] [address] [phone] (f)  [facility] [place], [place] [address] [phone] (f)       Syringe Services Program Due to COVID-19, syringe services programs are likely operating under different hours with limited or no fixed site hours. Some programs may not be operating at all. Please contact the program directly using the phone numbers provided below to see if they are still operating under COVID-19.  [facility] Solution to the Opioid Problem (GCSTOP) Fixed; mobile; peer-based [place] Aver [phone] [email or web address] Fixed site exchange at [facility], [place]. [place], [place] [address] on Wednesdays (2:00 - 5:00 pm) and Thursdays (4:00 - 8:00 pm). Pop-up mobile exchange locations: [employer] and [employer], [address] [place], [place] [address] on Tuesdays (11:00 am - 1:00 pm) and Fridays (11:00 am - 1:00 pm) [address], [place], [place] [address] on Tuesdays (2:00 - 4:00 pm) and Fridays (2:00 - 4:00 pm) [facility] - also serves [place] and [employer] [place] [employer] Fixed; mobile; peer-based Rosemary Holms [phone] [email or web address] [address] [place],  [address] Delivery and outreach available in [place] and [place], please call for more information. Monday, Tuesday: 1:00 -7:00 pm Thursday: 4:00 pm - 8:00 pm Friday: 1:00 pm - 8:00 pm)

## 2023-03-07 NOTE — Discharge Summary (Signed)
Physician Discharge Summary   Patient: Monique Perry MRN: [id number] DOB: [birth year]  Admit date:     03/06/2023  Discharge date: 03/07/23  Discharge Physician: Oswald Hillock   PCP: Kathyrn Lass, MD   Recommendations at discharge:   Follow-up PCP in 1 week Restarting gabapentin for neuropathy/alcohol use disorder/GAD and/or using Lamictal as an adjunct for her depression along with her mirtazapine.    Discharge Diagnoses: Principal Problem:   Acute respiratory failure with hypoxia (HCC) Active Problems:   Alcohol abuse   Costochondritis   Hypokalemia   Frequent falls   Peripheral neuropathy   Elevated transaminase level   GERD (gastroesophageal reflux disease)   Hyperlipidemia   Tobacco abuse   Osteopenia   Chronic abdominal pain  Resolved Problems:   Bipolar disorder [facility])  Hospital Course:   67 y.o. female with medical history significant of alcohol abuse, bipolar disorder, HLD, GERD who presented to hospital with complaints of falls and chest pain.  She fell 3x Thursday night and called EMS 2x to help her up. They suggested she come to ED, but she declined. She states she fell from her neuropathy.  She states today she started to have chest pain.  She has chronic chest pain, but it was worse today.  Pain in the center of her chest and described as heavy on her chest. The pain did not radiate. No associated symptoms. She states the pain never goes away. Her friends came over and suggested she come to ED. She denies any shortness of breath, wheezing or cough. She does have some nausea at times. She has diarrhea at times from her IBS.     Denies any fever/chills, vision changes/headaches, palpitations, shortness of breath or cough, abdominal pain, dysuria or leg swelling. She has not been eating well or sleeping well.  CXR: no acute finding CT head: no acute finding. Small left frontal scalp hematoma.  CT neck: no acute fracture CT chest/abdomen/pelvis: no acute findings.  Stable 52mm cystic lesion in the descending duodenum. Stable since 8/23, but new since 10/2018. Hiatal hernia. Tiny non obstructing right renal stones. Aortic atherosclerosis.    Assessment and Plan:  * Acute respiratory failure with hypoxia ([place]) 67 year old presenting to ED with complaints of chest pain and multiple falls, but found to be hypoxic on room air to 88% and requiring 2L oxygen [place] to maintain oxygenation. Was wheezing with poor air movement in ED -obs to telemetry  -has findings of paraseptal and centrilobular emphysema on chest CT and could have exacerbation although she has no complaints of shorntess of breath, wheezing, tightness or cough -Patient improved after starting on prednisone and inhalers. -At this time will discharge patient on prednisone taper, and DuoNeb every 6 hours as needed -She is currently not requiring oxygen, ambulated on room air, O2 sats above 90%.   Alcohol abuse Has past history of significant alcohol abuse. Per chart was still drinking heavy per sister in 2022, but she denies heavy use today and states she will drink a little at night -will not quantify how much -ethanol 108 today -elevated LFTS; resolved -No signs and symptoms of alcohol withdrawal.   Costochondritis -exam consistent with costochondritis -supportive care -x1 dose of toradol -heating pad   Hypokalemia Check magnesium Replete and trend   Frequent falls Multiple falls at home she contributes to neuropathy ? If alcohol playing a roll PT/OT was consulted -Will discharge home on home health PT/OT/home health aide  Peripheral neuropathy -She states cause  of her falls -On no medication and ? If alcohol also contributes to falls and neuropathy -seen by [facility] and underwent an EMG/NCS in 2019 which showed "a primarily axonal peripheral neuropathy of moderate severity.  -hx of b12 deficiency and is no longer taking this, B12 103 5 -lives alone, PT at home -Fall  precautions   Elevated transaminase level At baseline, likely from alcohol Check hepatitis panel  CT abdomen with no acute liver changes  -LFTs are back to normal  Bipolar disorder (HCC)-resolved as of 03/07/2023 On no medication  She states she goes days without eating, sleeps poorly and has fast speech, not pressured No si/hi  Psychiatry consulted -Recommend to start gabapentin along with Lamictal as outpatient  GERD (gastroesophageal reflux disease) Continue daily PPI   Hyperlipidemia Continue crestor 5mg  daily   Tobacco abuse Smokes cigars Declines nicotine patch   Osteopenia Due for her weekly tablet today and will give this today   Chronic abdominal pain Recurrent imaging with no acute finding Seen by GI thought IBS and started on bentyl  No complaints for worsening pain          Consultants:  Procedures performed:  Disposition: Home Diet recommendation:  Discharge Diet Orders (From admission, onward)     Start     Ordered   03/07/23 0000  Diet - low sodium heart healthy        03/07/23 1544           Regular diet DISCHARGE MEDICATION: Allergies as of 03/07/2023       Reactions   Erythrocin Other (See Comments)   Thrush   Erythromycin Other (See Comments)   thrush   Mesalamine    Other Reaction(s): suicidal thoughts   Sulfa Antibiotics Other (See Comments)   Thrush   Trazodone And Nefazodone Other (See Comments)   Severe insomnia   Tramadol Itching        Medication List     TAKE these medications    ALPRAZolam 0.5 MG tablet Commonly known as: XANAX Take 0.5 mg by mouth 2 (two) times daily as needed for anxiety.   dicyclomine 20 MG tablet Commonly known as: BENTYL Take 1 tablet (20 mg total) by mouth every 6 (six) hours. What changed: when to take this   ibuprofen 200 MG tablet Commonly known as: ADVIL Take 200-600 mg by mouth every 6 (six) hours as needed for moderate pain.   ipratropium-albuterol 0.5-2.5 (3) MG/3ML  Soln Commonly known as: DUONEB Take 3 mLs by nebulization every 6 (six) hours as needed.   metoprolol succinate 25 MG 24 hr tablet Commonly known as: TOPROL-XL Take 25 mg by mouth daily.   mirtazapine 30 MG tablet Commonly known as: REMERON Take 30 mg by mouth at bedtime.   MULTIVITAMIN PO Take 1 tablet by mouth daily.   omeprazole 20 MG capsule Commonly known as: PRILOSEC Take 1 capsule (20 mg total) by mouth daily.   ondansetron 4 MG disintegrating tablet Commonly known as: ZOFRAN-ODT Take 1 tablet (4 mg total) by mouth every 8 (eight) hours as needed for nausea or vomiting.   predniSONE 10 MG tablet Commonly known as: DELTASONE Prednisone 40 mg po daily x 1 day then Prednisone 30 mg po daily x 1 day then Prednisone 20 mg po daily x 1 day then Prednisone 10 mg daily x 1 day then stop...   risedronate 35 MG tablet Commonly known as: ACTONEL Take 35 mg by mouth once a week.   rosuvastatin 5  MG tablet Commonly known as: CRESTOR Take 5 mg by mouth daily.   VITAMIN B12 PO Take 1 tablet by mouth daily.   VITAMIN C PO Take 1 tablet by mouth daily.               Durable Medical Equipment  (From admission, onward)           Start     Ordered   03/07/23 1545  For home use only DME Nebulizer machine  Once       Question Answer Comment  Patient needs a nebulizer to treat with the following condition COPD (chronic obstructive pulmonary disease) ([place])   Length of Need Lifetime      03/07/23 1545            Discharge Exam: Filed Weights   03/06/23 0601  Weight: 55.3 kg   General-appears in no acute distress Heart-S1-S2, regular, no murmur auscultated Lungs-clear to auscultation bilaterally, no wheezing or crackles auscultated Abdomen-soft, nontender, no organomegaly Extremities-no edema in the lower extremities Neuro-alert, oriented x3, no focal deficit noted  Condition at discharge: good  The results of significant diagnostics from this  hospitalization (including imaging, microbiology, ancillary and laboratory) are listed below for reference.   Imaging Studies: DG Shoulder 1V Right  Result Date: 03/06/2023 CLINICAL DATA:  Fall several days ago with right shoulder pain, initial encounter EXAM: RIGHT SHOULDER - 1 VIEW COMPARISON:  CT from the previous day. FINDINGS: There is a minimally displaced distal right clavicular fracture identified. Right humerus is well seated in the glenoid. Vascular calcifications are seen. The underlying bony thorax appears within normal limits. IMPRESSION: Minimally displaced distal right clavicular fracture. Electronically Signed   By: Inez Catalina M.D.   On: 03/06/2023 22:29   CT CHEST ABDOMEN PELVIS W CONTRAST  Result Date: 03/06/2023 CLINICAL DATA:  Multiple recent falls.  Vomiting. EXAM: CT CHEST, ABDOMEN, AND PELVIS WITH CONTRAST TECHNIQUE: Multidetector CT imaging of the chest, abdomen and pelvis was performed following the standard protocol during bolus administration of intravenous contrast. RADIATION DOSE REDUCTION: This exam was performed according to the departmental dose-optimization program which includes automated exposure control, adjustment of the mA and/or kV according to patient size and/or use of iterative reconstruction technique. CONTRAST:  80mL OMNIPAQUE IOHEXOL 350 MG/ML SOLN COMPARISON:  Chest CT 02/02/2023.  Abdomen and pelvis CT 08/03/2022 FINDINGS: CT CHEST FINDINGS Cardiovascular: The heart size is normal. No substantial pericardial effusion. Advanced atherosclerotic calcification is noted in the wall of the thoracic aorta. Mediastinum/Nodes: No mediastinal lymphadenopathy. There is no hilar lymphadenopathy. The esophagus has normal imaging features. There is no axillary lymphadenopathy. Lungs/Pleura: Centrilobular and paraseptal emphysema evident. Biapical pleuroparenchymal scarring evident. No suspicious pulmonary nodule or mass. No focal airspace consolidation. No pleural  effusion. Compressive atelectasis noted dependent lung bases, right greater than left. Musculoskeletal: No worrisome lytic or sclerotic osseous abnormality. CT ABDOMEN PELVIS FINDINGS Hepatobiliary: No suspicious focal abnormality within the liver parenchyma. There is no evidence for gallstones, gallbladder wall thickening, or pericholecystic fluid. No intrahepatic or extrahepatic biliary dilation. Pancreas: No focal mass lesion. No dilatation of the main duct. No intraparenchymal cyst. No peripancreatic edema. Spleen: Cystic lesion anterior spleen stable at 3.2 x 3.0 cm. Status post splenic artery embolization. Adrenals/Urinary Tract: Stable thickening of both adrenal glands. Tiny nonobstructing stones noted lower pole right kidney. Left kidney unremarkable. No evidence for hydroureter. The urinary bladder appears normal for the degree of distention. Stomach/Bowel: Tiny hiatal hernia. Stomach otherwise unremarkable. Duodenum is normally positioned  as is the ligament of Treitz. 12 mm cystic lesion identified in the descending duodenum (73/3), potentially submucosal or intramural, stable since prior. No small bowel wall thickening. No small bowel dilatation. The terminal ileum is normal. The appendix is normal. No gross colonic mass. No colonic wall thickening. Mild left-sided diverticulosis without diverticulitis. Vascular/Lymphatic: Insert calcium aorta Severe There is no gastrohepatic or hepatoduodenal ligament lymphadenopathy. No retroperitoneal or mesenteric lymphadenopathy. No pelvic sidewall lymphadenopathy. Reproductive: Hysterectomy.  There is no adnexal mass. Other: No intraperitoneal free fluid. Musculoskeletal: No worrisome lytic or sclerotic osseous abnormality. Status post right SI joint fusion IMPRESSION: 1. No acute findings in the chest, abdomen, or pelvis. 2. Stable 12 mm cystic lesion in the descending duodenum, potentially submucosal or intramural. Stable since 08/03/2022, this is new since  11/01/2018. Upper endoscopy could be used to further evaluate as clinically warranted. 3. Hiatal hernia. 4. Mild left-sided diverticulosis without diverticulitis. 5. Tiny nonobstructing right renal stones. 6. Aortic Atherosclerosis (ICD10-I70.0) and Emphysema (ICD10-J43.9). Electronically Signed   By: Misty Stanley M.D.   On: 03/06/2023 09:25   CT Head Wo Contrast  Result Date: 03/06/2023 CLINICAL DATA:  Head trauma, minor (Age >= 65y); Neck trauma (Age >= 65y) EXAM: CT HEAD WITHOUT CONTRAST CT CERVICAL SPINE WITHOUT CONTRAST TECHNIQUE: Multidetector CT imaging of the head and cervical spine was performed following the standard protocol without intravenous contrast. Multiplanar CT image reconstructions of the cervical spine were also generated. RADIATION DOSE REDUCTION: This exam was performed according to the departmental dose-optimization program which includes automated exposure control, adjustment of the mA and/or kV according to patient size and/or use of iterative reconstruction technique. COMPARISON:  01/04/2020 FINDINGS: CT HEAD FINDINGS Brain: No evidence of acute infarction, hemorrhage, hydrocephalus, extra-axial collection or mass lesion/mass effect. Vascular: Atherosclerotic calcifications involving the large vessels of the skull base. No unexpected hyperdense vessel. Skull: Normal. Negative for fracture or focal lesion. Sinuses/Orbits: Mild diffuse paranasal sinus mucosal thickening. No air-fluid levels. Other: Small anterior left frontal scalp hematoma. CT CERVICAL SPINE FINDINGS Alignment: Facet joints are aligned without dislocation or traumatic listhesis. Dens and lateral masses are aligned. Exaggerated cervical lordosis, which may be positional. Skull base and vertebrae: No acute fracture. No primary bone lesion or focal pathologic process. Soft tissues and spinal canal: No prevertebral fluid or swelling. No visible canal hematoma. Disc levels: Intervertebral disc heights are preserved.  Moderate multilevel cervical facet joint arthropathy. Upper chest: Biapical pleuroparenchymal scarring. Other: Bilateral carotid atherosclerosis. IMPRESSION: 1. No acute intracranial abnormality. 2. Small anterior left frontal scalp hematoma. No underlying calvarial fracture. 3. No acute fracture or subluxation of the cervical spine. Electronically Signed   By: Davina Poke D.O.   On: 03/06/2023 09:19   CT Cervical Spine Wo Contrast  Result Date: 03/06/2023 CLINICAL DATA:  Head trauma, minor (Age >= 65y); Neck trauma (Age >= 65y) EXAM: CT HEAD WITHOUT CONTRAST CT CERVICAL SPINE WITHOUT CONTRAST TECHNIQUE: Multidetector CT imaging of the head and cervical spine was performed following the standard protocol without intravenous contrast. Multiplanar CT image reconstructions of the cervical spine were also generated. RADIATION DOSE REDUCTION: This exam was performed according to the departmental dose-optimization program which includes automated exposure control, adjustment of the mA and/or kV according to patient size and/or use of iterative reconstruction technique. COMPARISON:  01/04/2020 FINDINGS: CT HEAD FINDINGS Brain: No evidence of acute infarction, hemorrhage, hydrocephalus, extra-axial collection or mass lesion/mass effect. Vascular: Atherosclerotic calcifications involving the large vessels of the skull base. No unexpected hyperdense vessel. Skull: Normal.  Negative for fracture or focal lesion. Sinuses/Orbits: Mild diffuse paranasal sinus mucosal thickening. No air-fluid levels. Other: Small anterior left frontal scalp hematoma. CT CERVICAL SPINE FINDINGS Alignment: Facet joints are aligned without dislocation or traumatic listhesis. Dens and lateral masses are aligned. Exaggerated cervical lordosis, which may be positional. Skull base and vertebrae: No acute fracture. No primary bone lesion or focal pathologic process. Soft tissues and spinal canal: No prevertebral fluid or swelling. No visible canal  hematoma. Disc levels: Intervertebral disc heights are preserved. Moderate multilevel cervical facet joint arthropathy. Upper chest: Biapical pleuroparenchymal scarring. Other: Bilateral carotid atherosclerosis. IMPRESSION: 1. No acute intracranial abnormality. 2. Small anterior left frontal scalp hematoma. No underlying calvarial fracture. 3. No acute fracture or subluxation of the cervical spine. Electronically Signed   By: Davina Poke D.O.   On: 03/06/2023 09:19   DG Chest Port 1 View  Result Date: 03/06/2023 CLINICAL DATA:  67 year old female with chest pain, epigastric pain, 1 episode of vomiting. Multiple recent falls. Smoker. EXAM: PORTABLE CHEST 1 VIEW COMPARISON:  Screening chest CT 02/02/2023 and earlier. FINDINGS: Portable AP semi upright view at 0616 hours. Pulmonary hyperinflation demonstrated by CT last month, lower lung volumes today. Kyphotic positioning. Mediastinal contours are stable and within normal limits. Allowing for portable technique the lungs are clear. No pneumothorax or pleural effusion. No acute osseous abnormality identified. Bilateral axillary artery calcified atherosclerosis. Paucity bowel gas in the upper abdomen. IMPRESSION: No acute cardiopulmonary abnormality. Chronic pulmonary hyperinflation. Electronically Signed   By: Genevie Ann M.D.   On: 03/06/2023 06:40    Microbiology: Results for orders placed or performed during the hospital encounter of 08/03/22  Resp Panel by RT-PCR (Flu A&B, Covid)     Status: None   Collection Time: 08/03/22 10:41 AM   Specimen: Nasal Swab  Result Value Ref Range Status   SARS Coronavirus 2 by RT PCR NEGATIVE NEGATIVE Final    Comment: (NOTE) SARS-CoV-2 target nucleic acids are NOT DETECTED.  The SARS-CoV-2 RNA is generally detectable in upper respiratory specimens during the acute phase of infection. The lowest concentration of SARS-CoV-2 viral copies this assay can detect is 138 copies/mL. A negative result does not preclude  SARS-Cov-2 infection and should not be used as the sole basis for treatment or other patient management decisions. A negative result may occur with  improper specimen collection/handling, submission of specimen other than nasopharyngeal swab, presence of viral mutation(s) within the areas targeted by this assay, and inadequate number of viral copies(<138 copies/mL). A negative result must be combined with clinical observations, patient history, and epidemiological information. The expected result is Negative.  Fact Sheet for Patients:  EntrepreneurPulse.com.au  Fact Sheet for Healthcare Providers:  IncredibleEmployment.be  This test is no t yet approved or cleared by the [place] FDA and  has been authorized for detection and/or diagnosis of SARS-CoV-2 by FDA under an Emergency Use Authorization (EUA). This EUA will remain  in effect (meaning this test can be used) for the duration of the COVID-19 declaration under Section 564(b)(1) of the Act, 21 U.S.C.section 360bbb-3(b)(1), unless the authorization is terminated  or revoked sooner.       Influenza A by PCR NEGATIVE NEGATIVE Final   Influenza B by PCR NEGATIVE NEGATIVE Final    Comment: (NOTE) The Xpert Xpress SARS-CoV-2/FLU/RSV plus assay is intended as an aid in the diagnosis of influenza from Nasopharyngeal swab specimens and should not be used as a sole basis for treatment. Nasal washings and aspirates are unacceptable for  Xpert Xpress SARS-CoV-2/FLU/RSV testing.  Fact Sheet for Patients: EntrepreneurPulse.com.au  Fact Sheet for Healthcare Providers: IncredibleEmployment.be  This test is not yet approved or cleared by the [place] FDA and has been authorized for detection and/or diagnosis of SARS-CoV-2 by FDA under an Emergency Use Authorization (EUA). This EUA will remain in effect (meaning this test can be used) for the duration of  the COVID-19 declaration under Section 564(b)(1) of the Act, 21 U.S.C. section 360bbb-3(b)(1), unless the authorization is terminated or revoked.  Performed at [facility], [place] [address], [place], [place] [address]   Blood culture (routine x 2)     Status: None   Collection Time: 08/03/22 10:44 AM   Specimen: BLOOD  Result Value Ref Range Status   Specimen Description BLOOD SITE NOT SPECIFIED  Final   Special Requests   Final    BOTTLES DRAWN AEROBIC AND ANAEROBIC Blood Culture results may not be optimal due to an excessive volume of blood received in culture bottles   Culture   Final    NO GROWTH 5 DAYS Performed at [facility], [place] [address] [place], [place] [address]    Report Status 08/08/2022 FINAL  Final  Blood culture (routine x 2)     Status: None   Collection Time: 08/03/22 10:53 AM   Specimen: BLOOD  Result Value Ref Range Status   Specimen Description BLOOD SITE NOT SPECIFIED  Final   Special Requests   Final    BOTTLES DRAWN AEROBIC AND ANAEROBIC Blood Culture adequate volume   Culture   Final    NO GROWTH 5 DAYS Performed at [facility], [place] [address] [place], [place] [address]    Report Status 08/08/2022 FINAL  Final  Urine Culture     Status: Abnormal   Collection Time: 08/03/22  4:41 PM   Specimen: Urine, Clean Catch  Result Value Ref Range Status   Specimen Description URINE, CLEAN CATCH  Final   Special Requests NONE  Final   Culture (A)  Final    20,000 COLONIES/mL ESCHERICHIA COLI WITHIN MIXED ORGANISMS Performed at [facility], [place] [address], [place], [place] [address]    Report Status 08/06/2022 FINAL  Final   Organism ID, Bacteria ESCHERICHIA COLI (A)  Final      Susceptibility   Escherichia coli - MIC*    AMPICILLIN 8 SENSITIVE Sensitive     CEFAZOLIN <=4 SENSITIVE Sensitive     CEFEPIME <=0.12 SENSITIVE Sensitive     CEFTRIAXONE <=0.25 SENSITIVE Sensitive     CIPROFLOXACIN <=0.25 SENSITIVE Sensitive      GENTAMICIN <=1 SENSITIVE Sensitive     IMIPENEM <=0.25 SENSITIVE Sensitive     NITROFURANTOIN <=16 SENSITIVE Sensitive     TRIMETH/SULFA <=20 SENSITIVE Sensitive     AMPICILLIN/SULBACTAM 4 SENSITIVE Sensitive     PIP/TAZO <=4 SENSITIVE Sensitive     * 20,000 COLONIES/mL ESCHERICHIA COLI    Labs: CBC: Recent Labs  Lab 03/06/23 0626 03/07/23 0640  WBC 7.2 6.1  NEUTROABS 2.9  --   HGB 12.9 10.5*  HCT 37.6 31.1*  MCV 109.9* 111.1*  PLT 123* [id number]*   Basic Metabolic Panel: Recent Labs  Lab 03/06/23 0626 03/06/23 1508 03/07/23 0640  NA 140  --  139  K 3.3*  --  3.7  CL 101  --  106  CO2 22  --  26  GLUCOSE 98  --  99  BUN 17  --  14  CREATININE 1.19*  --  0.71  CALCIUM 8.6*  --  7.9*  MG  --  1.3* 1.7   Liver Function Tests: Recent Labs  Lab 03/06/23 0626 03/07/23 0640  AST 56* 26  ALT 64* 40  ALKPHOS 86 70  BILITOT 0.9 0.7  PROT 6.2* 5.4*  ALBUMIN 3.4* 3.0*   CBG: No results for input(s): "GLUCAP" in the last 168 hours.  Discharge time spent: greater than 30 minutes.  Signed: Oswald Hillock, MD [facility] 03/07/2023

## 2023-03-07 NOTE — Progress Notes (Signed)
O2 discontinued. Pt. Ambulated length of hallway with walker on R/A maintaining O2 sat of 95-96%, no SOB or any distress noted.  Monique Perry

## 2023-03-07 NOTE — Progress Notes (Signed)
Echocardiogram 2D Echocardiogram has been performed.  Monique Perry 03/07/2023, 5:08 PM

## 2023-03-07 NOTE — TOC Progression Note (Signed)
Transition of Care [facility]) - Initial/Assessment Note    Patient Details  Name: Monique Perry MRN: [id number] Date of Birth: [birth year]  Transition of Care [facility]) CM/SW Contact:    Milinda Antis, LCSWA Phone Number: 03/07/2023, 4:31 PM  Clinical Narrative:                 LCSW received consult for substance use.  Substance use resources were added to the AVS.   Patient Goals and CMS Choice            Expected Discharge Plan and Services         Expected Discharge Date: 03/07/23                                    Prior Living Arrangements/Services                       Activities of Daily Living Home Assistive Devices/Equipment: Shower chair with back, Wheelchair, Manuel Garcia (specify quad or straight), Hand-held shower hose, Grab bars in shower, Grab bars around toilet ADL Screening (condition at time of admission) Patient's cognitive ability adequate to safely complete daily activities?: Yes Is the patient deaf or have difficulty hearing?: No Does the patient have difficulty seeing, even when wearing glasses/contacts?: No Does the patient have difficulty concentrating, remembering, or making decisions?: Yes Patient able to express need for assistance with ADLs?: Yes Does the patient have difficulty dressing or bathing?: Yes Independently performs ADLs?: No Communication: Independent Dressing (OT): Independent Grooming: Independent Feeding: Independent Bathing: Independent Toileting: Independent In/Out Bed: Independent Walks in Home: Needs assistance Is this a change from baseline?: Pre-admission baseline Does the patient have difficulty walking or climbing stairs?: Yes Weakness of Legs: Both Weakness of Arms/Hands: None  Permission Sought/Granted                  Emotional Assessment              Admission diagnosis:  Hypoxia [R09.02] Acute respiratory failure with hypoxia ([place]) [J96.01] Frequent falls [R29.6] Traumatic hematoma of  forehead, initial encounter [S00.83XA] Contusion of left chest wall, initial encounter [S20.212A] Patient Active Problem List   Diagnosis Date Noted   Hypokalemia 03/06/2023   Acute respiratory failure with hypoxia ([place]) 03/06/2023   Chronic abdominal pain 03/06/2023   Frequent falls 03/06/2023   Costochondritis 03/06/2023   Tibia fracture 06/17/2020   Peripheral neuropathy 11/29/2018   Hypercalcemia 01/18/2017   Splenic rupture 07/15/2016   Serrated adenoma of cecum s/p polypectomy 2011 07/15/2016   Neck pain on left side 11/02/2014   Elevated transaminase level 02/14/2014   Anemia 11/14/2013   History of alcoholism ([place]) 11/12/2013   Hepatitis B vaccination administered at current visit 11/12/2013   Knee pain 06/18/2013   Right foot pain s/p fusion surgery of right foot 05/10/2013   Fracture of metatarsal of right foot, closed 03/20/2013   Abnormality of gait 03/20/2013   Low back pain radiating to right leg 02/20/2013   Chronic back pain 02/08/2013   Atherosclerotic peripheral vascular disease with intermittent claudication ([place]) 02/08/2013   Carotid bruit 02/08/2013   High blood pressure 02/08/2013   LSIL (low grade squamous intraepithelial lesion) on Pap smear 12/14/2012   Osteopenia 10/02/2012   Hyperlipidemia 08/31/2012   Depression 08/31/2012   left nose skin lesion 08/31/2012   Alcohol abuse    Drug abuse, history of  Collagenous colitis    Medically noncompliant 07/26/2012   GERD (gastroesophageal reflux disease) 07/21/2012   Tobacco abuse 07/21/2012   PCP:  Kathyrn Lass, MD Pharmacy:   [id number] - [place], [address] [place] [address] Phone: [phone] Fax: [phone]  [place], [address] [place] [address] Phone: [phone] Fax: [phone]     Social Determinants of Health (SDOH) Social  History: SDOH Screenings   Food Insecurity: No Food Insecurity (03/06/2023)  Housing: Low Risk  (03/06/2023)  Transportation Needs: No Transportation Needs (03/06/2023)  Utilities: Not At Risk (03/06/2023)  Alcohol Screen: Low Risk  (08/28/2020)  Depression (PHQ2-9): Low Risk  (08/28/2020)  Financial Resource Strain: Low Risk  (08/28/2020)  Physical Activity: Inactive (08/28/2020)  Social Connections: Moderately Isolated (08/28/2020)  Stress: No Stress Concern Present (08/28/2020)  Tobacco Use: High Risk (03/06/2023)   SDOH Interventions: Transportation Interventions: Inpatient TOC   Readmission Risk Interventions     No data to display

## 2023-05-26 ENCOUNTER — Other Ambulatory Visit: Payer: Self-pay | Admitting: Nurse Practitioner

## 2023-05-26 DIAGNOSIS — F1029 Alcohol dependence with unspecified alcohol-induced disorder: Secondary | ICD-10-CM

## 2023-05-26 DIAGNOSIS — K760 Fatty (change of) liver, not elsewhere classified: Secondary | ICD-10-CM

## 2023-05-26 DIAGNOSIS — K7460 Unspecified cirrhosis of liver: Secondary | ICD-10-CM

## 2023-06-03 ENCOUNTER — Other Ambulatory Visit: Payer: Self-pay | Admitting: Physician Assistant

## 2023-06-27 ENCOUNTER — Other Ambulatory Visit: Payer: Medicare HMO

## 2023-06-29 ENCOUNTER — Other Ambulatory Visit: Payer: Medicare HMO

## 2023-07-01 ENCOUNTER — Ambulatory Visit
Admission: RE | Admit: 2023-07-01 | Discharge: 2023-07-01 | Disposition: A | Discharge status: Home / Self Care | Payer: Medicare HMO | Source: Ambulatory Visit | Attending: Nurse Practitioner | Admitting: Nurse Practitioner

## 2023-07-01 DIAGNOSIS — K760 Fatty (change of) liver, not elsewhere classified: Secondary | ICD-10-CM

## 2023-07-01 DIAGNOSIS — F1029 Alcohol dependence with unspecified alcohol-induced disorder: Secondary | ICD-10-CM

## 2023-07-01 DIAGNOSIS — K7460 Unspecified cirrhosis of liver: Secondary | ICD-10-CM

## 2023-07-19 ENCOUNTER — Emergency Department (HOSPITAL_COMMUNITY)
Admission: EM | Admit: 2023-07-19 | Discharge: 2023-07-20 | Disposition: A | Discharge status: Home / Self Care | Payer: Medicare HMO | Attending: Emergency Medicine | Admitting: Emergency Medicine

## 2023-07-19 DIAGNOSIS — Y908 Blood alcohol level of 240 mg/100 ml or more: Secondary | ICD-10-CM | POA: Insufficient documentation

## 2023-07-19 DIAGNOSIS — R7401 Elevation of levels of liver transaminase levels: Secondary | ICD-10-CM | POA: Insufficient documentation

## 2023-07-19 DIAGNOSIS — F10120 Alcohol abuse with intoxication, uncomplicated: Secondary | ICD-10-CM | POA: Diagnosis not present

## 2023-07-19 DIAGNOSIS — W01198A Fall on same level from slipping, tripping and stumbling with subsequent striking against other object, initial encounter: Secondary | ICD-10-CM | POA: Diagnosis not present

## 2023-07-19 DIAGNOSIS — Z79899 Other long term (current) drug therapy: Secondary | ICD-10-CM | POA: Insufficient documentation

## 2023-07-19 DIAGNOSIS — D696 Thrombocytopenia, unspecified: Secondary | ICD-10-CM | POA: Diagnosis not present

## 2023-07-19 DIAGNOSIS — R531 Weakness: Secondary | ICD-10-CM | POA: Diagnosis not present

## 2023-07-19 DIAGNOSIS — R197 Diarrhea, unspecified: Secondary | ICD-10-CM | POA: Insufficient documentation

## 2023-07-19 DIAGNOSIS — F10920 Alcohol use, unspecified with intoxication, uncomplicated: Secondary | ICD-10-CM

## 2023-07-19 DIAGNOSIS — R296 Repeated falls: Secondary | ICD-10-CM | POA: Insufficient documentation

## 2023-07-19 DIAGNOSIS — E876 Hypokalemia: Secondary | ICD-10-CM | POA: Diagnosis not present

## 2023-07-20 ENCOUNTER — Emergency Department (HOSPITAL_COMMUNITY): Payer: Medicare HMO

## 2023-07-20 ENCOUNTER — Other Ambulatory Visit: Payer: Self-pay

## 2023-07-20 ENCOUNTER — Encounter (HOSPITAL_COMMUNITY): Payer: Self-pay

## 2023-07-20 LAB — CBC WITH DIFFERENTIAL/PLATELET
Abs Immature Granulocytes: 0.03 10*3/uL (ref 0.00–0.07)
Basophils Absolute: 0 10*3/uL (ref 0.0–0.1)
Basophils Relative: 0 %
Eosinophils Absolute: 0.1 10*3/uL (ref 0.0–0.5)
Eosinophils Relative: 1 %
HCT: 37.2 % (ref 36.0–46.0)
Hemoglobin: 12.5 g/dL (ref 12.0–15.0)
Immature Granulocytes: 1 %
Lymphocytes Relative: 57 %
Lymphs Abs: 3.8 10*3/uL (ref 0.7–4.0)
MCH: 32.8 pg (ref 26.0–34.0)
MCHC: 33.6 g/dL (ref 30.0–36.0)
MCV: 97.6 fL (ref 80.0–100.0)
Monocytes Absolute: 0.6 10*3/uL (ref 0.1–1.0)
Monocytes Relative: 8 %
Neutro Abs: 2.2 10*3/uL (ref 1.7–7.7)
Neutrophils Relative %: 33 %
Platelets: 121 10*3/uL — ABNORMAL LOW (ref 150–400)
RBC: 3.81 MIL/uL — ABNORMAL LOW (ref 3.87–5.11)
RDW: 14.8 % (ref 11.5–15.5)
WBC: 6.6 10*3/uL (ref 4.0–10.5)
nRBC: 0 % (ref 0.0–0.2)

## 2023-07-20 LAB — COMPREHENSIVE METABOLIC PANEL
ALT: 30 U/L (ref 0–44)
AST: 67 U/L — ABNORMAL HIGH (ref 15–41)
Albumin: 3.5 g/dL (ref 3.5–5.0)
Alkaline Phosphatase: 91 U/L (ref 38–126)
Anion gap: 15 (ref 5–15)
BUN: 14 mg/dL (ref 8–23)
CO2: 20 mmol/L — ABNORMAL LOW (ref 22–32)
Calcium: 7.9 mg/dL — ABNORMAL LOW (ref 8.9–10.3)
Chloride: 105 mmol/L (ref 98–111)
Creatinine, Ser: 0.59 mg/dL (ref 0.44–1.00)
GFR, Estimated: >60 mL/min (ref >60)
Glucose, Bld: 65 mg/dL — ABNORMAL LOW (ref 70–99)
Potassium: 3.1 mmol/L — ABNORMAL LOW (ref 3.5–5.1)
Sodium: 140 mmol/L (ref 135–145)
Total Bilirubin: 0.8 mg/dL (ref 0.3–1.2)
Total Protein: 6.5 g/dL (ref 6.5–8.1)

## 2023-07-20 LAB — ETHANOL: Alcohol, Ethyl (B): 278 mg/dL — ABNORMAL HIGH (ref <10)

## 2023-07-20 LAB — MAGNESIUM: Magnesium: 1.5 mg/dL — ABNORMAL LOW (ref 1.7–2.4)

## 2023-07-20 MED ORDER — SODIUM CHLORIDE 0.9 % IV BOLUS
1000.0000 mL | Freq: Once | INTRAVENOUS | Status: AC
  Administered 2023-07-20: 1000 mL via INTRAVENOUS

## 2023-07-20 MED ORDER — ONDANSETRON HCL 4 MG/2ML IJ SOLN
4.0000 mg | Freq: Once | INTRAMUSCULAR | Status: AC
  Administered 2023-07-20: 4 mg via INTRAVENOUS
  Filled 2023-07-20: qty 2

## 2023-07-20 MED ORDER — POTASSIUM CHLORIDE CRYS ER 20 MEQ PO TBCR: EXTENDED_RELEASE_TABLET | ORAL | 0 refills | Status: AC

## 2023-07-20 MED ORDER — POTASSIUM CHLORIDE CRYS ER 20 MEQ PO TBCR
40.0000 meq | EXTENDED_RELEASE_TABLET | Freq: Once | ORAL | Status: AC
  Administered 2023-07-20: 40 meq via ORAL
  Filled 2023-07-20: qty 2

## 2023-07-20 MED ORDER — LOPERAMIDE HCL 2 MG PO CAPS
4.0000 mg | ORAL_CAPSULE | Freq: Once | ORAL | Status: AC
  Administered 2023-07-20: 4 mg via ORAL
  Filled 2023-07-20: qty 2

## 2023-07-20 MED ORDER — MAGNESIUM SULFATE 2 GM/50ML IV SOLN: 2.0000 g | Freq: Once | INTRAVENOUS | Status: DC

## 2023-07-20 MED ORDER — MAGNESIUM CHLORIDE 64 MG PO TBEC
2.0000 | DELAYED_RELEASE_TABLET | Freq: Once | ORAL | Status: AC
  Administered 2023-07-20: 128 mg via ORAL
  Filled 2023-07-20: qty 2

## 2023-07-20 MED ORDER — ONDANSETRON 4 MG PO TBDP: 4.0000 mg | ORAL_TABLET | Freq: Three times a day (TID) | ORAL | 0 refills | Status: AC | PRN

## 2023-07-20 MED ORDER — CALCIUM-MAGNESIUM-VITAMIN D ER 600-40-500 MG-MG-UNIT PO TB24: 1.0000 | ORAL_TABLET | Freq: Two times a day (BID) | ORAL | 0 refills | Status: AC

## 2023-07-20 NOTE — ED Notes (Signed)
Assisted pt to restroom and attempted to get urine sample but pt missed collection hat completely.

## 2023-07-20 NOTE — ED Triage Notes (Signed)
Pt bib EMS from home for c/o ETOH, diarrhea, and multiple falls over the past 6 days. Skin tear to R forearm from fall tonight. Pt seen by PCP today for ongoing diarrhea, concerns for blood in stool and hitting head for previous fall. Referral for scans to be done by PCP per EMS. Pt is A&O and follows commands. Occasional alcohol use per pt, large empty bottle of vodka found by EMS. No pain, denies LOC and thinners. 20 R forearm and 500 NS bolus by ems, orthostatic 140 sitting to 100s systolic standing.

## 2023-07-20 NOTE — Discharge Instructions (Signed)
You need to stop drinking.  When you are drinking, you are much more likely to fall.  You would not allow me to give you intravenous magnesium.  I am ordering some magnesium tablets to build up your magnesium, but it will be much slower than if you got the intravenous magnesium.  You also have a low potassium and I am ordering a prescription for magnesium to take at home.  Take loperamide (Imodium A-D) as needed for diarrhea.  Return to the emergency department for any new or concerning symptoms.

## 2023-07-20 NOTE — ED Provider Notes (Signed)
EMERGENCY DEPARTMENT AT [facility] Provider Note   CSN: [id number] Arrival date & time: 07/19/23  2350     History  Chief Complaint  Patient presents with   Fall   Weakness   Diarrhea    Monique Perry is a 67 y.o. female.  The history is provided by the patient.  Fall  Weakness Associated symptoms: diarrhea   Diarrhea She has history of hyperlipidemia, bipolar disorder, alcohol abuse, GERD and comes in because of diarrhea.  She states she has been having diarrhea for the last 5 days, more than 10 bowel movements a day.  She denies any blood or mucus in the stool.  She denies any fever or chills.  She denies abdominal pain.  There has been nausea but no vomiting.  She did see her primary care provider who recommended that she use an antidiarrheal medication, but the patient has not actually taken anything.  As a separate complaint, she fell 2 days ago and hit her head.  She states that she has neuropathy and she has a chair that assists her to get up but she had urinary urgency and got up too quickly and fell.  This is not unusual for her.  When asked about alcohol use, she admits to 1 drink tonight but states that she had not been drinking before tonight.   Home Medications Prior to Admission medications   Medication Sig Start Date End Date Taking? Authorizing Provider  ALPRAZolam Prudy Feeler) 0.5 MG tablet Take 0.5 mg by mouth 2 (two) times daily as needed for anxiety. 01/25/22   [provider]  Ascorbic Acid (VITAMIN C PO) Take 1 tablet by mouth daily.    [provider]  Cyanocobalamin (VITAMIN B12 PO) Take 1 tablet by mouth daily.    [provider]  dicyclomine (BENTYL) 20 MG tablet Take 1 tablet (20 mg total) by mouth every 6 (six) hours. Patient taking differently: Take 20 mg by mouth daily. 02/23/22   Unk Lightning, PA  ibuprofen (ADVIL) 200 MG tablet Take 200-600 mg by mouth every 6 (six) hours as needed for moderate  pain.    [provider]  ipratropium-albuterol (DUONEB) 0.5-2.5 (3) MG/3ML SOLN Take 3 mLs by nebulization every 6 (six) hours as needed. 03/07/23   Meredeth Ide, MD  metoprolol succinate (TOPROL-XL) 25 MG 24 hr tablet Take 25 mg by mouth daily. 02/13/23   [provider]  mirtazapine (REMERON) 30 MG tablet Take 30 mg by mouth at bedtime.  12/14/19   [provider]  Multiple Vitamin (MULTIVITAMIN PO) Take 1 tablet by mouth daily.    [provider]  omeprazole (PRILOSEC) 20 MG capsule TAKE 1 CAPSULE EVERY DAY 06/03/23   Unk Lightning, PA  ondansetron (ZOFRAN-ODT) 4 MG disintegrating tablet Take 1 tablet (4 mg total) by mouth every 8 (eight) hours as needed for nausea or vomiting. 08/03/22   Alvira Monday, MD  predniSONE (DELTASONE) 10 MG tablet Prednisone 40 mg po daily x 1 day then Prednisone 30 mg po daily x 1 day then Prednisone 20 mg po daily x 1 day then Prednisone 10 mg daily x 1 day then stop... 03/07/23   Meredeth Ide, MD  risedronate (ACTONEL) 35 MG tablet Take 35 mg by mouth once a week. 01/23/22   [provider]  rosuvastatin (CRESTOR) 5 MG tablet Take 5 mg by mouth daily. 01/29/22   [provider]      Allergies  Erythrocin, Erythromycin, Mesalamine, Sulfa antibiotics, Trazodone and nefazodone, and Tramadol    Review of Systems   Review of Systems  Gastrointestinal:  Positive for diarrhea.  Neurological:  Positive for weakness.  All other systems reviewed and are negative.   Physical Exam Updated Vital Signs BP 113/69 (BP Location: Right Arm)   Pulse 98   Temp 98.3 F (36.8 C) (Oral)   Resp 20   SpO2 95%  Physical Exam Vitals and nursing note reviewed.   67 year old female, resting comfortably and in no acute distress. Vital signs are normal. Oxygen saturation is 95%, which is normal. Head is normocephalic and atraumatic. PERRLA, EOMI. Oropharynx is clear. Neck is nontender. Back is nontender and there is  no CVA tenderness. Lungs are clear without rales, wheezes, or rhonchi. Chest is nontender. Heart has regular rate and rhythm without murmur. Abdomen is soft, flat, nontender. Extremities have no cyanosis or edema, full range of motion is present. Skin is warm and dry without rash. Neurologic: Mental status is normal, cranial nerves are intact, moves all extremities equally.  ED Results / Procedures / Treatments   Labs (all labs ordered are listed, but only abnormal results are displayed) Labs Reviewed  COMPREHENSIVE METABOLIC PANEL - Abnormal; Notable for the following components:      Result Value   Potassium 3.1 (*)    CO2 20 (*)    Glucose, Bld 65 (*)    Calcium 7.9 (*)    AST 67 (*)    All other components within normal limits  CBC WITH DIFFERENTIAL/PLATELET - Abnormal; Notable for the following components:   RBC 3.81 (*)    Platelets 121 (*)    All other components within normal limits  ETHANOL - Abnormal; Notable for the following components:   Alcohol, Ethyl (B) 278 (*)    All other components within normal limits  MAGNESIUM - Abnormal; Notable for the following components:   Magnesium 1.5 (*)    All other components within normal limits   Procedures Procedures    Medications Ordered in ED Medications  sodium chloride 0.9 % bolus 1,000 mL (has no administration in time range)    ED Course/ Medical Decision Making/ A&P                                 Medical Decision Making Amount and/or Complexity of Data Reviewed Labs: ordered. Radiology: ordered.  Risk OTC drugs. Prescription drug management.   Diarrhea which appears to be relatively benign.  She does not clinically appear to be dehydrated.  Fall with head injury but no evidence of other injury.  I have reviewed her past records, and she was was admitted on 03/06/2019 for because of multiple falls.  I have ordered IV fluids, ondansetron for nausea, loperamide for diarrhea.  I have ordered CT of head and  cervical spine because of recent falls.  I ordered laboratory workup of CBC, comprehensive metabolic panel, magnesium, ethanol level.  I have reviewed and interpreted her laboratory tests, and my interpretation is hypokalemia, borderline low glucose, mildly elevated AST likely related to alcohol abuse, hypomagnesemia, mild thrombocytopenia likely secondary to alcohol abuse, markedly elevated ethanol level much higher than what would be expected if patient's history was accurate.  I believe that many of her falls are related to alcohol intoxication combined with her neuropathy.  CT of head shows atrophy but no acute abnormality, CT of cervical spine shows  degenerative changes but no acute injury.  I have independently viewed the images, and agree with radiologist's interpretation.  I have ordered oral potassium and intravenous magnesium.  Unfortunately, patient pulled out her IV and is refusing to allow it to be restarted so she cannot get intravenous magnesium.  I am ordering some oral magnesium.  I have confronted her about her alcohol abuse and strongly encouraged she stop drinking and I am giving her outpatient resources for substance abuse.  I am discharging her with prescriptions for oral potassium and ondansetron oral dissolving tablet as well as oral magnesium.  I have instructed her to take over-the-counter loperamide as needed for diarrhea.  Return if symptoms are worsening.  Final Clinical Impression(s) / ED Diagnoses Final diagnoses:  Diarrhea, unspecified type  Alcohol intoxication, uncomplicated (HCC)  Frequent falls  Hypokalemia  Hypomagnesemia  Elevated AST (SGOT)  Thrombocytopenia (HCC)    Rx / DC Orders ED Discharge Orders          Ordered    potassium chloride SA (KLOR-CON M) 20 MEQ tablet        07/20/23 0249    Calcium-Magnesium-Vitamin D 600-40-500 MG-MG-UNIT TB24  2 times daily        07/20/23 0249    ondansetron (ZOFRAN-ODT) 4 MG disintegrating tablet  Every 8 hours PRN         07/20/23 0249              Dione Booze, MD 07/20/23 [phone]

## 2023-07-20 NOTE — ED Notes (Signed)
Patient transported to CT 

## 2023-08-17 ENCOUNTER — Other Ambulatory Visit: Payer: Self-pay | Admitting: Physician Assistant

## 2023-09-19 ENCOUNTER — Ambulatory Visit
Admission: RE | Admit: 2023-09-19 | Discharge: 2023-09-19 | Disposition: A | Discharge status: Home / Self Care | Payer: Medicare HMO | Source: Ambulatory Visit | Attending: Nurse Practitioner | Admitting: Nurse Practitioner

## 2023-09-19 DIAGNOSIS — E2839 Other primary ovarian failure: Secondary | ICD-10-CM

## 2023-09-19 DIAGNOSIS — Z1231 Encounter for screening mammogram for malignant neoplasm of breast: Secondary | ICD-10-CM

## 2023-12-12 ENCOUNTER — Other Ambulatory Visit: Payer: Self-pay | Admitting: Acute Care

## 2023-12-12 DIAGNOSIS — Z122 Encounter for screening for malignant neoplasm of respiratory organs: Secondary | ICD-10-CM

## 2023-12-12 DIAGNOSIS — Z87891 Personal history of nicotine dependence: Secondary | ICD-10-CM

## 2023-12-12 DIAGNOSIS — F17210 Nicotine dependence, cigarettes, uncomplicated: Secondary | ICD-10-CM

## 2023-12-16 ENCOUNTER — Telehealth: Payer: Self-pay

## 2023-12-16 NOTE — Telephone Encounter (Signed)
 Returned VM. Spoke with patient. Patient cancelled CT stating she would call back after her annual PE with her PCP. Declined CT at this time.

## 2024-01-16 ENCOUNTER — Telehealth: Payer: Self-pay

## 2024-01-16 NOTE — Telephone Encounter (Signed)
Attempted to schedule annual LCS> Advises she is not ready yet. She is being evaluated this week to see if she needs a home health aid due to falls. States she has bad neuropathy. Pt has LCS office number. Advises she will call us back when she is ready to schedule. She is aware scan is due 02/06/2024.

## 2024-02-06 ENCOUNTER — Other Ambulatory Visit: Payer: Medicare HMO

## 2024-02-07 ENCOUNTER — Ambulatory Visit
Admission: RE | Admit: 2024-02-07 | Discharge: 2024-02-07 | Disposition: A | Discharge status: Home / Self Care | Payer: 59 | Source: Ambulatory Visit | Attending: Nurse Practitioner | Admitting: Nurse Practitioner

## 2024-02-07 DIAGNOSIS — Z122 Encounter for screening for malignant neoplasm of respiratory organs: Secondary | ICD-10-CM

## 2024-02-07 DIAGNOSIS — F17210 Nicotine dependence, cigarettes, uncomplicated: Secondary | ICD-10-CM

## 2024-02-07 DIAGNOSIS — Z87891 Personal history of nicotine dependence: Secondary | ICD-10-CM

## 2024-02-28 ENCOUNTER — Emergency Department (HOSPITAL_COMMUNITY)
Admission: EM | Admit: 2024-02-28 | Discharge: 2024-02-29 | Disposition: A | Discharge status: Home / Self Care | Attending: Emergency Medicine | Admitting: Emergency Medicine

## 2024-02-28 ENCOUNTER — Encounter (HOSPITAL_COMMUNITY): Payer: Self-pay | Admitting: Emergency Medicine

## 2024-02-28 DIAGNOSIS — R197 Diarrhea, unspecified: Secondary | ICD-10-CM | POA: Insufficient documentation

## 2024-02-28 DIAGNOSIS — R1084 Generalized abdominal pain: Secondary | ICD-10-CM | POA: Diagnosis not present

## 2024-02-28 DIAGNOSIS — K52831 Collagenous colitis: Secondary | ICD-10-CM | POA: Insufficient documentation

## 2024-02-28 DIAGNOSIS — K529 Noninfective gastroenteritis and colitis, unspecified: Secondary | ICD-10-CM | POA: Diagnosis not present

## 2024-02-28 DIAGNOSIS — I700 Atherosclerosis of aorta: Secondary | ICD-10-CM | POA: Insufficient documentation

## 2024-02-28 DIAGNOSIS — K760 Fatty (change of) liver, not elsewhere classified: Secondary | ICD-10-CM | POA: Diagnosis not present

## 2024-02-28 DIAGNOSIS — Z79899 Other long term (current) drug therapy: Secondary | ICD-10-CM | POA: Insufficient documentation

## 2024-02-28 LAB — CBC
HCT: 42.2 % (ref 36.0–46.0)
Hemoglobin: 13.8 g/dL (ref 12.0–15.0)
MCH: 33.9 pg (ref 26.0–34.0)
MCHC: 32.7 g/dL (ref 30.0–36.0)
MCV: 103.7 fL — ABNORMAL HIGH (ref 80.0–100.0)
Platelets: 261 10*3/uL (ref 150–400)
RBC: 4.07 MIL/uL (ref 3.87–5.11)
RDW: 13 % (ref 11.5–15.5)
WBC: 9.7 10*3/uL (ref 4.0–10.5)
nRBC: 0 % (ref 0.0–0.2)

## 2024-02-28 LAB — COMPREHENSIVE METABOLIC PANEL
ALT: 91 U/L — ABNORMAL HIGH (ref 0–44)
AST: 107 U/L — ABNORMAL HIGH (ref 15–41)
Albumin: 3.6 g/dL (ref 3.5–5.0)
Alkaline Phosphatase: 62 U/L (ref 38–126)
Anion gap: 11 (ref 5–15)
BUN: 25 mg/dL — ABNORMAL HIGH (ref 8–23)
CO2: 23 mmol/L (ref 22–32)
Calcium: 8.3 mg/dL — ABNORMAL LOW (ref 8.9–10.3)
Chloride: 102 mmol/L (ref 98–111)
Creatinine, Ser: 0.98 mg/dL (ref 0.44–1.00)
GFR, Estimated: >60 mL/min (ref >60)
Glucose, Bld: 145 mg/dL — ABNORMAL HIGH (ref 70–99)
Potassium: 3.7 mmol/L (ref 3.5–5.1)
Sodium: 136 mmol/L (ref 135–145)
Total Bilirubin: 0.5 mg/dL (ref 0.0–1.2)
Total Protein: 6.8 g/dL (ref 6.5–8.1)

## 2024-02-28 LAB — POC OCCULT BLOOD, ED: Fecal Occult Bld: NEGATIVE

## 2024-02-28 MED ORDER — SODIUM CHLORIDE 0.9 % IV BOLUS
1000.0000 mL | Freq: Once | INTRAVENOUS | Status: AC
  Administered 2024-02-28: 1000 mL via INTRAVENOUS

## 2024-02-28 MED ORDER — DICYCLOMINE HCL 10 MG/ML IM SOLN
20.0000 mg | Freq: Once | INTRAMUSCULAR | Status: AC
  Administered 2024-02-28: 20 mg via INTRAMUSCULAR
  Filled 2024-02-28: qty 2

## 2024-02-28 NOTE — ED Triage Notes (Signed)
 Patient comes in pov for diarrhea for 3 weeks. Denies fever. Denies n/v.  No antibiotics recently.

## 2024-02-28 NOTE — ED Provider Notes (Signed)
 [place] EMERGENCY DEPARTMENT AT [facility] Provider Note   CSN: [id number] Arrival date & time: 02/28/24  1639     History  Chief Complaint  Patient presents with   Diarrhea    Monique Perry is a 68 y.o. female,  collagenous colitis, who presents to the ED secondary to 3 weeks, diarrhea.  She states for the last 3 weeks, she has had blackish green, diarrhea, that is about 20 episodes a day.  She states that she has foul-smelling that diarrhea, and large amounts, in the morning, and then frequent episodes of smaller diarrhea afterwards.  She denies any fevers, chills.  She has not had any recent hospitalizations, or antibiotics.  She denies any lightheadedness, or shortness of breath.  Reports generalized abdominal tenderness.  Home Medications Prior to Admission medications   Medication Sig Start Date End Date Taking? Authorizing Provider  ALPRAZolam Prudy Feeler) 0.5 MG tablet Take 0.5 mg by mouth 2 (two) times daily as needed for anxiety. 01/25/22   [provider]  Ascorbic Acid (VITAMIN C PO) Take 1 tablet by mouth daily.    [provider]  Calcium-Magnesium-Vitamin D 600-40-500 MG-MG-UNIT TB24 Take 1 tablet by mouth 2 (two) times daily. 07/20/23   Dione Booze, MD  Cyanocobalamin (VITAMIN B12 PO) Take 1 tablet by mouth daily.    [provider]  dicyclomine (BENTYL) 20 MG tablet Take 1 tablet (20 mg total) by mouth every 6 (six) hours. Patient taking differently: Take 20 mg by mouth daily. 02/23/22   Unk Lightning, PA  ibuprofen (ADVIL) 200 MG tablet Take 200-600 mg by mouth every 6 (six) hours as needed for moderate pain.    [provider]  ipratropium-albuterol (DUONEB) 0.5-2.5 (3) MG/3ML SOLN Take 3 mLs by nebulization every 6 (six) hours as needed. 03/07/23   Meredeth Ide, MD  metoprolol succinate (TOPROL-XL) 25 MG 24 hr tablet Take 25 mg by mouth daily. 02/13/23   [provider]  mirtazapine (REMERON) 30 MG tablet  Take 30 mg by mouth at bedtime.  12/14/19   [provider]  Multiple Vitamin (MULTIVITAMIN PO) Take 1 tablet by mouth daily.    [provider]  omeprazole (PRILOSEC) 20 MG capsule TAKE 1 CAPSULE EVERY DAY 06/03/23   Unk Lightning, PA  ondansetron (ZOFRAN-ODT) 4 MG disintegrating tablet Take 1 tablet (4 mg total) by mouth every 8 (eight) hours as needed for nausea or vomiting. 07/20/23   Dione Booze, MD  potassium chloride SA (KLOR-CON M) 20 MEQ tablet Take one pill twice a day for the next ten days, then take one pill daily 07/20/23   Dione Booze, MD  risedronate (ACTONEL) 35 MG tablet Take 35 mg by mouth once a week. 01/23/22   [provider]  rosuvastatin (CRESTOR) 5 MG tablet Take 5 mg by mouth daily. 01/29/22   [provider]      Allergies    Erythrocin, Erythromycin, Mesalamine, Sulfa antibiotics, Trazodone and nefazodone, and Tramadol    Review of Systems   Review of Systems  Constitutional:  Negative for fever.  Gastrointestinal:  Positive for diarrhea.     Physical Exam Updated Vital Signs BP (!) 128/56   Pulse 65   Temp 98.9 F (37.2 C) (Oral)   Resp 14   Ht 5\' 6"  (1.676 m)   Wt 61.2 kg   SpO2 97%   BMI 21.79 kg/m  Physical Exam Vitals and nursing note reviewed.  Constitutional:  General: She is not in acute distress.    Appearance: She is well-developed.  HENT:     Head: Normocephalic and atraumatic.  Eyes:     Conjunctiva/sclera: Conjunctivae normal.  Cardiovascular:     Rate and Rhythm: Normal rate and regular rhythm.     Heart sounds: No murmur heard. Pulmonary:     Effort: Pulmonary effort is normal. No respiratory distress.     Breath sounds: Normal breath sounds.  Abdominal:     Palpations: Abdomen is soft.     Tenderness: There is generalized abdominal tenderness.  Genitourinary:    Rectum: Guaiac result negative. No mass.     Comments: Greenish stool, on exam.  Tech present for rectal  exam Musculoskeletal:        General: No swelling.     Cervical back: Neck supple.  Skin:    General: Skin is warm and dry.     Capillary Refill: Capillary refill takes less than 2 seconds.  Neurological:     Mental Status: She is alert.  Psychiatric:        Mood and Affect: Mood normal.     ED Results / Procedures / Treatments   Labs (all labs ordered are listed, but only abnormal results are displayed) Labs Reviewed  COMPREHENSIVE METABOLIC PANEL - Abnormal; Notable for the following components:      Result Value   Glucose, Bld 145 (*)    BUN 25 (*)    Calcium 8.3 (*)    AST 107 (*)    ALT 91 (*)    All other components within normal limits  CBC - Abnormal; Notable for the following components:   MCV 103.7 (*)    All other components within normal limits  GASTROINTESTINAL PANEL BY PCR, STOOL (REPLACES STOOL CULTURE)  C DIFFICILE QUICK SCREEN W PCR REFLEX    URINALYSIS, ROUTINE W REFLEX MICROSCOPIC  POC OCCULT BLOOD, ED    EKG None  Radiology No results found.  Procedures Procedures    Medications Ordered in ED Medications  dicyclomine (BENTYL) injection 20 mg (20 mg Intramuscular Given 02/28/24 2337)  sodium chloride 0.9 % bolus 1,000 mL (1,000 mLs Intravenous New Bag/Given 02/28/24 2337)    ED Course/ Medical Decision Making/ A&P                                 Medical Decision Making Patient is a 68 year old female, here for excessive diarrhea for the last 3 weeks.  Large amounts.  She has a history of collagenous colitis, will obtain CT, GI panel, given the large volumes of diarrhea.  Also obtain CT abdomen pelvis, further evaluation.  Bentyl ordered, as well as fluids  Amount and/or Complexity of Data Reviewed Labs: ordered.    Details: No electrolyte deficiencies Radiology: ordered. Discussion of management or test interpretation with external provider(s): Signed out to [place], [place], pending CT abd/pelvis pelvis, C. difficile testing and repeat  eval  Risk Prescription drug management.    Final Clinical Impression(s) / ED Diagnoses Final diagnoses:  None    Rx / DC Orders ED Discharge Orders     None         Gwenith Tschida, [place], PA 02/28/24 2349    Royanne Foots, DO 03/05/24 [phone]

## 2024-02-29 ENCOUNTER — Other Ambulatory Visit (HOSPITAL_COMMUNITY)

## 2024-02-29 ENCOUNTER — Emergency Department (HOSPITAL_COMMUNITY)

## 2024-02-29 LAB — GASTROINTESTINAL PANEL BY PCR, STOOL (REPLACES STOOL CULTURE)

## 2024-02-29 LAB — C DIFFICILE QUICK SCREEN W PCR REFLEX
C Diff antigen: NEGATIVE
C Diff interpretation: NOT DETECTED
C Diff toxin: NEGATIVE

## 2024-02-29 MED ORDER — DICYCLOMINE HCL 20 MG PO TABS
20.0000 mg | ORAL_TABLET | Freq: Two times a day (BID) | ORAL | 0 refills | Status: AC
Start: 2024-02-29 — End: ?

## 2024-02-29 MED ORDER — IOHEXOL 300 MG/ML  SOLN
100.0000 mL | Freq: Once | INTRAMUSCULAR | Status: AC | PRN
  Administered 2024-02-29: 100 mL via INTRAVENOUS

## 2024-02-29 NOTE — ED Provider Notes (Signed)
 Accepted handoff at shift change from Small PA-C. Please see prior provider note for more detail.  In short, patient here with concerns of 2 to 3 weeks of watery diarrhea.  No sick contacts as far she is aware.  No recent antibiotic use.  Briefly: Patient is 68 y.o.   DDX: concern for gastritis, bowel obstruction, stercoral colitis, diverticulitis, intra-abdominal abscess  Plan: CTAP and reassessment after Bentyl for disposition   Physical Exam  BP 119/60   Pulse 69   Temp 98.9 F (37.2 C) (Oral)   Resp 15   Ht 5\' 6"  (1.676 m)   Wt 61.2 kg   SpO2 96%   BMI 21.79 kg/m   Physical Exam Vitals and nursing note reviewed.  Constitutional:      General: She is not in acute distress.    Appearance: She is well-developed.  HENT:     Head: Normocephalic and atraumatic.  Eyes:     Conjunctiva/sclera: Conjunctivae normal.  Cardiovascular:     Rate and Rhythm: Normal rate and regular rhythm.     Heart sounds: No murmur heard. Pulmonary:     Effort: Pulmonary effort is normal. No respiratory distress.     Breath sounds: Normal breath sounds.  Abdominal:     Palpations: Abdomen is soft.     Tenderness: There is generalized abdominal tenderness.  Genitourinary:    Rectum: Guaiac result negative. No mass.     Comments: Greenish stool, on exam.  Tech present for rectal exam Musculoskeletal:        General: No swelling.     Cervical back: Neck supple.  Skin:    General: Skin is warm and dry.     Capillary Refill: Capillary refill takes less than 2 seconds.  Neurological:     Mental Status: She is alert.  Psychiatric:        Mood and Affect: Mood normal.     Procedures  Procedures  ED Course / MDM    Medical Decision Making Amount and/or Complexity of Data Reviewed Labs: ordered. Radiology: ordered.  Risk Prescription drug management.   CT abdomen pelvis is concerning for mild enterocolitis.  No other acute abnormal findings.  On reassessment, patient does appear to  have improved with Bentyl and states that her abdominal discomfort has improved and she has had reduction in her bowel output.  Will plan on discharging patient home with a continued prescription for Bentyl.  Advised use of this medication multiple times per day as diarrhea persist.  Encourage close follow-up with PCP and GI.  Advised patient that her GI panel will be pending over the next few days and she will be contacted if results are indicating need for additional treatment.  Patient verbalized understanding possible need for modification to treatment.  Return precautions discussed and patient verbalized understanding.  Patient discharged home in stable condition.       Smitty Knudsen, PA-C 02/29/24 0227    Nira Conn, MD 02/29/24 [phone]

## 2024-02-29 NOTE — Discharge Instructions (Signed)
 You are seen in the emergency department today for concerns of diarrhea.  Your CT imaging shows findings of enterocolitis.  Given that your symptoms been ongoing for about 2 to 3 weeks, a bacterial infection is less likely but your stool sample has been sent off for testing.  If any specific abnormalities are found at this that require treatment, you will be notified.  I have sent a prescription for Bentyl to your pharmacy for continued support of your abdominal discomfort and diarrhea.  Please take this as prescribed.  Follow-up with your primary care provider.  Return to the emergency department for any concerns of new or worsening symptoms.

## 2024-06-05 ENCOUNTER — Other Ambulatory Visit: Payer: Self-pay | Admitting: Acute Care

## 2024-06-05 DIAGNOSIS — Z122 Encounter for screening for malignant neoplasm of respiratory organs: Secondary | ICD-10-CM

## 2024-06-05 DIAGNOSIS — F17210 Nicotine dependence, cigarettes, uncomplicated: Secondary | ICD-10-CM

## 2024-06-05 DIAGNOSIS — Z87891 Personal history of nicotine dependence: Secondary | ICD-10-CM

## 2024-08-16 ENCOUNTER — Other Ambulatory Visit: Payer: Self-pay | Admitting: Nurse Practitioner

## 2024-08-16 DIAGNOSIS — Z1231 Encounter for screening mammogram for malignant neoplasm of breast: Secondary | ICD-10-CM

## 2024-09-20 ENCOUNTER — Ambulatory Visit

## 2024-10-03 ENCOUNTER — Ambulatory Visit
Admission: RE | Admit: 2024-10-03 | Discharge: 2024-10-03 | Disposition: A | Source: Ambulatory Visit | Attending: Nurse Practitioner

## 2024-10-03 DIAGNOSIS — Z1231 Encounter for screening mammogram for malignant neoplasm of breast: Secondary | ICD-10-CM

## 2024-10-04 ENCOUNTER — Ambulatory Visit

## 2025-02-07 ENCOUNTER — Other Ambulatory Visit
